# Patient Record
Sex: Female | Born: 1950 | Race: White | Hispanic: No | Marital: Single
Health system: Midwestern US, Community
[De-identification: ages and names within clinical notes are randomized; demographics above are authoritative.]

## PROBLEM LIST (undated history)

## (undated) DIAGNOSIS — H269 Unspecified cataract: Secondary | ICD-10-CM

## (undated) DIAGNOSIS — I4891 Unspecified atrial fibrillation: Secondary | ICD-10-CM

## (undated) DIAGNOSIS — K219 Gastro-esophageal reflux disease without esophagitis: Secondary | ICD-10-CM

## (undated) DIAGNOSIS — I10 Essential (primary) hypertension: Secondary | ICD-10-CM

## (undated) DIAGNOSIS — G43909 Migraine, unspecified, not intractable, without status migrainosus: Secondary | ICD-10-CM

## (undated) DIAGNOSIS — L409 Psoriasis, unspecified: Secondary | ICD-10-CM

## (undated) DIAGNOSIS — T7840XA Allergy, unspecified, initial encounter: Secondary | ICD-10-CM

## (undated) DIAGNOSIS — E7800 Pure hypercholesterolemia, unspecified: Secondary | ICD-10-CM

## (undated) DIAGNOSIS — F419 Anxiety disorder, unspecified: Secondary | ICD-10-CM

## (undated) HISTORY — DX: Allergy, unspecified, initial encounter: T78.40XA

## (undated) HISTORY — PX: TUBAL LIGATION: SHX77

## (undated) HISTORY — DX: Pure hypercholesterolemia, unspecified: E78.00

## (undated) HISTORY — PX: DILATION AND CURETTAGE OF UTERUS: SHX78

## (undated) HISTORY — DX: Essential (primary) hypertension: I10

## (undated) HISTORY — DX: Anxiety disorder, unspecified: F41.9

## (undated) HISTORY — DX: Migraine, unspecified, not intractable, without status migrainosus: G43.909

## (undated) HISTORY — DX: Psoriasis, unspecified: L40.9

## (undated) HISTORY — DX: Unspecified cataract: H26.9

## (undated) HISTORY — DX: Gastro-esophageal reflux disease without esophagitis: K21.9

---

## 2004-02-09 LAB — HM COLONOSCOPY

## 2009-06-08 ENCOUNTER — Ambulatory Visit: Payer: Self-pay | Admitting: Internal Medicine

## 2010-08-05 ENCOUNTER — Ambulatory Visit: Payer: Self-pay | Admitting: Internal Medicine

## 2010-08-13 ENCOUNTER — Ambulatory Visit: Payer: Self-pay | Admitting: Internal Medicine

## 2010-09-01 ENCOUNTER — Ambulatory Visit: Payer: Self-pay | Admitting: Internal Medicine

## 2012-08-27 ENCOUNTER — Other Ambulatory Visit: Payer: Self-pay | Admitting: Internal Medicine

## 2012-08-27 NOTE — Telephone Encounter (Signed)
Have you seen a fax from [facility].  Need to know what meds and the dose she needs filled.

## 2012-08-27 NOTE — Telephone Encounter (Signed)
Pt came in today checking on her rx [facility]  [name] that over 08/27/12 Pt is completely out of her meds Pt didn't know what the name of the meds

## 2012-08-28 ENCOUNTER — Telehealth: Payer: Self-pay | Admitting: *Deleted

## 2012-08-30 ENCOUNTER — Telehealth: Payer: Self-pay | Admitting: *Deleted

## 2012-09-03 NOTE — Telephone Encounter (Signed)
Per note that Dr. Lorin Picket gave me the patient needed hctz 12.5 mg and provastatin 20 mg which she took care of.

## 2012-09-05 NOTE — Telephone Encounter (Signed)
Encounter opened in error

## 2012-09-06 ENCOUNTER — Telehealth: Payer: Self-pay | Admitting: *Deleted

## 2012-09-06 NOTE — Telephone Encounter (Signed)
Received refill request.  New patient appointment is December 23.  Medications are for Pravastatin 20 mg and Hydrochlorot 12.5. Would this be okay?

## 2012-09-06 NOTE — Telephone Encounter (Signed)
These rx were called in already.  I confirmed with pharmacy.

## 2012-10-15 ENCOUNTER — Encounter: Payer: Self-pay | Admitting: Internal Medicine

## 2012-10-15 ENCOUNTER — Other Ambulatory Visit (HOSPITAL_COMMUNITY)
Admission: RE | Admit: 2012-10-15 | Discharge: 2012-10-15 | Disposition: A | Discharge status: Home / Self Care | Payer: Self-pay | Source: Ambulatory Visit | Attending: Internal Medicine | Admitting: Internal Medicine

## 2012-10-15 ENCOUNTER — Ambulatory Visit (INDEPENDENT_AMBULATORY_CARE_PROVIDER_SITE_OTHER): Payer: 59 | Admitting: Internal Medicine

## 2012-10-15 VITALS — BP 120/83 | HR 72 | Temp 98.4°F | Ht 68.0 in | Wt 150.0 lb

## 2012-10-15 DIAGNOSIS — Z139 Encounter for screening, unspecified: Secondary | ICD-10-CM

## 2012-10-15 DIAGNOSIS — G43909 Migraine, unspecified, not intractable, without status migrainosus: Secondary | ICD-10-CM | POA: Insufficient documentation

## 2012-10-15 DIAGNOSIS — Z01419 Encounter for gynecological examination (general) (routine) without abnormal findings: Secondary | ICD-10-CM | POA: Insufficient documentation

## 2012-10-15 DIAGNOSIS — E7800 Pure hypercholesterolemia, unspecified: Secondary | ICD-10-CM

## 2012-10-15 DIAGNOSIS — R5381 Other malaise: Secondary | ICD-10-CM

## 2012-10-15 DIAGNOSIS — K219 Gastro-esophageal reflux disease without esophagitis: Secondary | ICD-10-CM

## 2012-10-15 DIAGNOSIS — R5383 Other fatigue: Secondary | ICD-10-CM

## 2012-10-15 DIAGNOSIS — I10 Essential (primary) hypertension: Secondary | ICD-10-CM | POA: Insufficient documentation

## 2012-10-15 DIAGNOSIS — Z1151 Encounter for screening for human papillomavirus (HPV): Secondary | ICD-10-CM | POA: Insufficient documentation

## 2012-10-15 LAB — BASIC METABOLIC PANEL
BUN: 15 mg/dL (ref 6–23)
CO2: 29 mEq/L (ref 19–32)
Calcium: 8.9 mg/dL (ref 8.4–10.5)
Chloride: 102 mEq/L (ref 96–112)
Creatinine, Ser: 0.7 mg/dL (ref 0.4–1.2)
GFR: 92.2 mL/min (ref >60.00)
Glucose, Bld: 90 mg/dL (ref 70–99)
Potassium: 4 mEq/L (ref 3.5–5.1)
Sodium: 139 mEq/L (ref 135–145)

## 2012-10-15 LAB — CBC WITH DIFFERENTIAL/PLATELET
Basophils Absolute: 0.1 10*3/uL (ref 0.0–0.1)
Basophils Relative: 1.2 % (ref 0.0–3.0)
Eosinophils Absolute: 0.1 10*3/uL (ref 0.0–0.7)
Eosinophils Relative: 2.2 % (ref 0.0–5.0)
HCT: 39.8 % (ref 36.0–46.0)
Hemoglobin: 13.2 g/dL (ref 12.0–15.0)
Lymphocytes Relative: 15.1 % (ref 12.0–46.0)
Lymphs Abs: 0.7 10*3/uL (ref 0.7–4.0)
MCHC: 33.3 g/dL (ref 30.0–36.0)
MCV: 91.8 fl (ref 78.0–100.0)
Monocytes Absolute: 0.4 10*3/uL (ref 0.1–1.0)
Monocytes Relative: 8.4 % (ref 3.0–12.0)
Neutro Abs: 3.6 10*3/uL (ref 1.4–7.7)
Neutrophils Relative %: 73.1 % (ref 43.0–77.0)
Platelets: 251 10*3/uL (ref 150.0–400.0)
RBC: 4.33 Mil/uL (ref 3.87–5.11)
RDW: 13.4 % (ref 11.5–14.6)
WBC: 4.9 10*3/uL (ref 4.5–10.5)

## 2012-10-15 LAB — HEPATIC FUNCTION PANEL
ALT: 15 U/L (ref 0–35)
AST: 21 U/L (ref 0–37)
Albumin: 3.9 g/dL (ref 3.5–5.2)
Alkaline Phosphatase: 55 U/L (ref 39–117)
Bilirubin, Direct: 0.1 mg/dL (ref 0.0–0.3)
Total Bilirubin: 0.5 mg/dL (ref 0.3–1.2)
Total Protein: 6.5 g/dL (ref 6.0–8.3)

## 2012-10-15 LAB — LIPID PANEL
Cholesterol: 169 mg/dL (ref 0–200)
HDL: 63.8 mg/dL (ref >39.00)
LDL Cholesterol: 101 mg/dL — ABNORMAL HIGH (ref 0–99)
Total CHOL/HDL Ratio: 3
Triglycerides: 23 mg/dL (ref 0.0–149.0)
VLDL: 4.6 mg/dL (ref 0.0–40.0)

## 2012-10-15 LAB — TSH: TSH: 1.06 u[IU]/mL (ref 0.35–5.50)

## 2012-10-15 MED ORDER — PRAVASTATIN SODIUM 20 MG PO TABS: 20.0000 mg | ORAL_TABLET | Freq: Every day | ORAL | Status: DC

## 2012-10-15 MED ORDER — HYDROCHLOROTHIAZIDE 12.5 MG PO CAPS: 12.5000 mg | ORAL_CAPSULE | Freq: Every day | ORAL | Status: DC

## 2012-10-19 ENCOUNTER — Encounter: Payer: Self-pay | Admitting: *Deleted

## 2012-10-20 ENCOUNTER — Encounter: Payer: Self-pay | Admitting: Internal Medicine

## 2012-10-20 NOTE — Assessment & Plan Note (Signed)
Blood pressure under good control.  Same medication.  Check metabolic panel.    

## 2012-10-20 NOTE — Assessment & Plan Note (Signed)
Stable.  Follow.   

## 2012-10-20 NOTE — Assessment & Plan Note (Signed)
Symptoms controlled on Prilosec

## 2012-10-20 NOTE — Assessment & Plan Note (Signed)
On pravastatin.  Low cholesterol diet and exercise.  Check lipid panel and liver function.    

## 2012-10-20 NOTE — Progress Notes (Signed)
  Subjective:    Patient ID: Baxter Hire, female    DOB: 1950-11-04, 61 y.o.   MRN: [id number]  HPI 61 year old female with past history of hypertension and hypercholesterolemia who comes in today to follow up on these issues as well as for a complete physical exam.   She states she has been dong well.  No cardiac symptoms with increased activity or exertion.  Breathing stable.  No acid reflux.  Bowels stable.    Past Medical History  Diagnosis Date  . Hypertension   . Hypercholesterolemia   . GERD (gastroesophageal reflux disease)   . Migraine headache   . Psoriasis     Current Outpatient Prescriptions on File Prior to Visit  Medication Sig Dispense Refill  . eletriptan (RELPAX) 20 MG tablet One tablet by mouth at onset of headache. May repeat in 2 hours if headache persists or recurs. may repeat in 2 hours if necessary      . hydrochlorothiazide (MICROZIDE) 12.5 MG capsule Take 1 capsule (12.5 mg total) by mouth daily.  30 capsule  6  . omeprazole (PRILOSEC) 20 MG capsule Take 20 mg by mouth daily.      . pravastatin (PRAVACHOL) 20 MG tablet Take 1 tablet (20 mg total) by mouth daily.  30 tablet  6    Review of Systems Patient denies any headache, lightheadedness or dizziness.  No chest pain, tightness or palpitations.  No increased shortness of breath, cough or congestion.  No nausea or vomiting.  No acid reflux.  No abdominal pain or cramping.  No bowel change, such as diarrhea, constipation, BRBPR or melana.  No urine change.        Objective:   Physical Exam Filed Vitals:   10/15/12 0826  BP: 120/83  Pulse: 72  Temp: 98.4 F (45.11 C)   61  year old female in no acute distress.   HEENT:  Nares- clear.  Oropharynx - without lesions. NECK:  Supple.  Nontender.  No audible bruit.  HEART:  Appears to be regular. LUNGS:  No crackles or wheezing audible.  Respirations even and unlabored.  RADIAL PULSE:  Equal bilaterally.    BREASTS:  No nipple discharge or nipple  retraction present.  Could not appreciate any distinct nodules or axillary adenopathy.  ABDOMEN:  Soft, nontender.  Bowel sounds present and normal.  No audible abdominal bruit.  GU:  Normal external genitalia.  Vaginal vault without lesions.  Cervix identified.  Pap performed. Could not appreciate any adnexal masses or tenderness.   RECTAL:  Heme negative.   EXTREMITIES:  No increased edema present.  DP pulses palpable and equal bilaterally.          Assessment & Plan:  CARDIOVASCULAR.  Stress test 08/17/11 negative for ischemia.  Currently asymptomatic.  Continue risk factor modification.    GI.  Colonoscopy 02/09/04 normal  Recommend follow up 10 years.    GU.  Found to have renal cysts on ultrasound.  MRI 09/01/10 revealed complex renal cyst.  Saw Dr Lonna Cobb.  Had had recommended a follow up MRI in 02/2011.  Have discussed with her the need for follow up MRI.  She has declined and stated she would notify me if she changed her mind.    LEG PAIN.  Stable.  Continue support hose.    HEALTH MAINTENANCE.  Physical today.  Colonoscopy as outlined.  Pap today.  Schedule mammogram.

## 2012-11-08 ENCOUNTER — Ambulatory Visit: Payer: Self-pay | Admitting: Internal Medicine

## 2012-11-14 ENCOUNTER — Encounter: Payer: Self-pay | Admitting: Internal Medicine

## 2012-11-23 ENCOUNTER — Encounter: Payer: Self-pay | Admitting: Internal Medicine

## 2012-11-25 ENCOUNTER — Telehealth: Payer: Self-pay | Admitting: Internal Medicine

## 2012-11-25 NOTE — Telephone Encounter (Signed)
Called in xanax .25mg  q day prn (#30) no refills.

## 2013-04-19 ENCOUNTER — Ambulatory Visit: Payer: 59 | Admitting: Internal Medicine

## 2013-05-29 ENCOUNTER — Ambulatory Visit (INDEPENDENT_AMBULATORY_CARE_PROVIDER_SITE_OTHER): Payer: 59 | Admitting: Adult Health

## 2013-05-29 ENCOUNTER — Encounter: Payer: Self-pay | Admitting: Adult Health

## 2013-05-29 ENCOUNTER — Telehealth: Payer: Self-pay | Admitting: Adult Health

## 2013-05-29 VITALS — BP 102/62 | HR 71 | Temp 98.3°F | Resp 12 | Wt 141.5 lb

## 2013-05-29 DIAGNOSIS — Z1211 Encounter for screening for malignant neoplasm of colon: Secondary | ICD-10-CM

## 2013-05-29 DIAGNOSIS — R197 Diarrhea, unspecified: Secondary | ICD-10-CM

## 2013-05-29 LAB — CBC WITH DIFFERENTIAL/PLATELET
Basophils Absolute: 0 10*3/uL (ref 0.0–0.1)
Basophils Relative: 0.5 % (ref 0.0–3.0)
Eosinophils Absolute: 0.1 10*3/uL (ref 0.0–0.7)
Eosinophils Relative: 2.5 % (ref 0.0–5.0)
HCT: 42.2 % (ref 36.0–46.0)
Hemoglobin: 14 g/dL (ref 12.0–15.0)
Lymphocytes Relative: 21.7 % (ref 12.0–46.0)
Lymphs Abs: 1.3 10*3/uL (ref 0.7–4.0)
MCHC: 33.2 g/dL (ref 30.0–36.0)
MCV: 93.1 fl (ref 78.0–100.0)
Monocytes Absolute: 0.5 10*3/uL (ref 0.1–1.0)
Monocytes Relative: 9.4 % (ref 3.0–12.0)
Neutro Abs: 3.8 10*3/uL (ref 1.4–7.7)
Neutrophils Relative %: 65.9 % (ref 43.0–77.0)
Platelets: 254 10*3/uL (ref 150.0–400.0)
RBC: 4.53 Mil/uL (ref 3.87–5.11)
RDW: 13.2 % (ref 11.5–14.6)
WBC: 5.8 10*3/uL (ref 4.5–10.5)

## 2013-05-29 LAB — BASIC METABOLIC PANEL
BUN: 11 mg/dL (ref 6–23)
CO2: 32 mEq/L (ref 19–32)
Calcium: 9.1 mg/dL (ref 8.4–10.5)
Chloride: 99 mEq/L (ref 96–112)
Creatinine, Ser: 0.7 mg/dL (ref 0.4–1.2)
GFR: 84.87 mL/min (ref >60.00)
Glucose, Bld: 89 mg/dL (ref 70–99)
Potassium: 4.1 mEq/L (ref 3.5–5.1)
Sodium: 140 mEq/L (ref 135–145)

## 2013-05-29 MED ORDER — CIPROFLOXACIN HCL 500 MG PO TABS: 500.0000 mg | ORAL_TABLET | Freq: Two times a day (BID) | ORAL | Status: DC

## 2013-05-29 NOTE — Assessment & Plan Note (Signed)
Abdominal exam with slight tenderness over LLQ.  Check cbc and metabolic panel. Start Cipro 500 mg bid x 12 days. Refer to GI for Colonoscopy. Last colonoscopy 10 years ago.

## 2013-05-29 NOTE — Telephone Encounter (Signed)
Labs done today were normal (cbc & bmet)

## 2013-05-29 NOTE — Progress Notes (Signed)
  Subjective:    Patient ID: Allison [name], female    DOB: [birth year], 62 y.o.   MRN: [id number]  HPI  Patient is a pleasant 61 y/o female who presents to clinic with c/o bloody diarrhea since Sunday. Initially, diarrhea began on Sunday at midnight. She had approx 6 stools without any blood. Then began bloody diarrhea on Monday and had ~ 4. No diarrhea Tuesday. Today she got up around 4 am and had one episode. There was blood when she wiped. She took imodium. She has also taken some Maalox yesterday. She denies any episodes such as this. She ate at [employer] on Sunday and wondered if it may have been something she ate. She reported being chilled and fatigue. Patient denies any hx of diverticulosis or hemorrhoids. Last colonoscopy ~ 10 years ago.  Review of Systems  Constitutional: Positive for chills and fatigue.       Uncertain if she had a fever. She did not check it.  Respiratory: Negative.   Cardiovascular: Negative.   Gastrointestinal: Positive for nausea, vomiting, abdominal pain, diarrhea, blood in stool and anal bleeding.       Vomited x 1 on Sunday midnight  Genitourinary: Negative.   Neurological: Negative.   Psychiatric/Behavioral: Negative.       BP 102/62  Pulse 71  Temp(Src) 98.3 F (36.8 C) (Oral)  Resp 12  Wt 141 lb 8 oz (64.184 kg)  BMI 21.52 kg/m2  SpO2 96%    Objective:   Physical Exam        Assessment & Plan:

## 2013-05-29 NOTE — Patient Instructions (Addendum)
  Please start Cipro 500 mg every 12 hours for 5 days.  Continue to drink fluids.  I am referring to you to GI for colonoscopy.  Please call immediately if her symptoms worsen.

## 2013-05-30 NOTE — Telephone Encounter (Signed)
Called and advised patient of results

## 2013-05-30 NOTE — Telephone Encounter (Signed)
Left message for pt to return my call.

## 2013-06-20 ENCOUNTER — Other Ambulatory Visit: Payer: Self-pay | Admitting: *Deleted

## 2013-06-20 MED ORDER — ALPRAZOLAM 0.25 MG PO TABS: 0.2500 mg | ORAL_TABLET | Freq: Every day | ORAL | Status: DC | PRN

## 2013-06-20 NOTE — Telephone Encounter (Signed)
Okay to refill? Last seen for an Acute visit on 8/6 by Raquel. Last seen by you on 10/15/12

## 2013-06-20 NOTE — Telephone Encounter (Signed)
I am ok to refill x 1, but she needs to schedule a follow up appt with me

## 2013-08-02 ENCOUNTER — Other Ambulatory Visit: Payer: Self-pay | Admitting: *Deleted

## 2013-08-02 MED ORDER — HYDROCHLOROTHIAZIDE 12.5 MG PO CAPS: 12.5000 mg | ORAL_CAPSULE | Freq: Every day | ORAL | Status: DC

## 2013-08-02 MED ORDER — PRAVASTATIN SODIUM 20 MG PO TABS: 20.0000 mg | ORAL_TABLET | Freq: Every day | ORAL | Status: DC

## 2013-08-26 ENCOUNTER — Telehealth: Payer: Self-pay | Admitting: Adult Health

## 2013-08-26 NOTE — Telephone Encounter (Signed)
Patient was referred for a screening colonoscopy. Note from GI stating that patient cancelled appt and declined to reschedule.

## 2013-10-02 ENCOUNTER — Other Ambulatory Visit: Payer: Self-pay | Admitting: *Deleted

## 2013-10-02 ENCOUNTER — Other Ambulatory Visit: Payer: Self-pay | Admitting: Internal Medicine

## 2013-10-07 ENCOUNTER — Telehealth: Payer: Self-pay | Admitting: Internal Medicine

## 2013-10-07 ENCOUNTER — Other Ambulatory Visit: Payer: Self-pay | Admitting: *Deleted

## 2013-10-07 MED ORDER — HYDROCHLOROTHIAZIDE 12.5 MG PO CAPS: 12.5000 mg | ORAL_CAPSULE | Freq: Every day | ORAL | Status: DC

## 2013-10-07 NOTE — Telephone Encounter (Signed)
Rx sent to pharmacy for a one month refill

## 2013-10-07 NOTE — Telephone Encounter (Signed)
The patient is driving long distance and she is needing a prescription called into the pharmacy until she can be seen in January .   hydrochlorothiazide (MICROZIDE) 12.5 MG capsule

## 2013-11-12 ENCOUNTER — Encounter (INDEPENDENT_AMBULATORY_CARE_PROVIDER_SITE_OTHER): Payer: Self-pay

## 2013-11-12 ENCOUNTER — Ambulatory Visit (INDEPENDENT_AMBULATORY_CARE_PROVIDER_SITE_OTHER): Payer: Self-pay | Admitting: Internal Medicine

## 2013-11-12 ENCOUNTER — Encounter: Payer: Self-pay | Admitting: Internal Medicine

## 2013-11-12 ENCOUNTER — Other Ambulatory Visit: Payer: Self-pay | Admitting: Internal Medicine

## 2013-11-12 VITALS — BP 120/80 | HR 75 | Temp 98.3°F | Ht 68.0 in | Wt 139.0 lb

## 2013-11-12 DIAGNOSIS — K219 Gastro-esophageal reflux disease without esophagitis: Secondary | ICD-10-CM

## 2013-11-12 DIAGNOSIS — E7800 Pure hypercholesterolemia, unspecified: Secondary | ICD-10-CM

## 2013-11-12 DIAGNOSIS — H579 Unspecified disorder of eye and adnexa: Secondary | ICD-10-CM

## 2013-11-12 DIAGNOSIS — I10 Essential (primary) hypertension: Secondary | ICD-10-CM

## 2013-11-12 DIAGNOSIS — G43909 Migraine, unspecified, not intractable, without status migrainosus: Secondary | ICD-10-CM

## 2013-11-12 DIAGNOSIS — H5789 Other specified disorders of eye and adnexa: Secondary | ICD-10-CM

## 2013-11-12 MED ORDER — GENTAMICIN SULFATE 0.3 % OP SOLN: 2.0000 [drp] | Freq: Four times a day (QID) | OPHTHALMIC | Status: DC

## 2013-11-12 MED ORDER — ALPRAZOLAM 0.25 MG PO TABS: 0.2500 mg | ORAL_TABLET | Freq: Every day | ORAL | Status: DC | PRN

## 2013-11-12 NOTE — Progress Notes (Signed)
Order placed for labs.

## 2013-11-14 ENCOUNTER — Encounter: Payer: Self-pay | Admitting: Internal Medicine

## 2013-11-14 DIAGNOSIS — H5789 Other specified disorders of eye and adnexa: Secondary | ICD-10-CM | POA: Insufficient documentation

## 2013-11-14 NOTE — Assessment & Plan Note (Signed)
Symptoms controlled on Prilosec

## 2013-11-14 NOTE — Assessment & Plan Note (Signed)
Stable.  Follow.   

## 2013-11-14 NOTE — Progress Notes (Signed)
Subjective:    Patient ID: Allison [name], female    DOB: 1951/10/21, 63 y.o.   MRN: [id number]  HPI 63 year old female with past history of hypertension and hypercholesterolemia who comes in today for a scheduled follow up.   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  No cardiac symptoms with increased activity or exertion.  Breathing stable.  No acid reflux.  Bowels stable.  Was having some constipation.  Better now if watches what she eats.  Increased stress with her financial situation.  Feels she is coping well.  Some eye irritation.  Question of sty.     Past Medical History  Diagnosis Date  . Hypertension   . Hypercholesterolemia   . GERD (gastroesophageal reflux disease)   . Migraine headache   . Psoriasis     Current Outpatient Prescriptions on File Prior to Visit  Medication Sig Dispense Refill  . eletriptan (RELPAX) 20 MG tablet One tablet by mouth at onset of headache. May repeat in 2 hours if headache persists or recurs. may repeat in 2 hours if necessary      . hydrochlorothiazide (MICROZIDE) 12.5 MG capsule Take 1 capsule (12.5 mg total) by mouth daily. MUST KEEP APPT IN January FOR FURTHER REFILLS  30 capsule  0  . omeprazole (PRILOSEC) 20 MG capsule Take 20 mg by mouth daily.       No current facility-administered medications on file prior to visit.    Review of Systems Patient denies any headache, lightheadedness or dizziness.  No chest pain, tightness or palpitations.  No increased shortness of breath, cough or congestion.  No nausea or vomiting.  No acid reflux.  No abdominal pain or cramping.  No bowel change, such as diarrhea, constipation, BRBPR or melana.  No urine change.  Increased stress.  Feels she is handling things relatively well.  Does not feels she needs anything more at this point.        Objective:   Physical Exam  Filed Vitals:   11/12/13 1016  BP: 120/80  Pulse: 75  Temp: 98.3 F (36.8 C)   Blood pressure recheck:  93/17  63  year old female  in no acute distress.   HEENT:  Nares- clear.  Oropharynx - without lesions. NECK:  Supple.  Nontender.  No audible bruit.  HEART:  Appears to be regular. LUNGS:  No crackles or wheezing audible.  Respirations even and unlabored.  RADIAL PULSE:  Equal bilaterally.  ABDOMEN:  Soft, nontender.  Bowel sounds present and normal.  No audible abdominal bruit.    EXTREMITIES:  No increased edema present.  DP pulses palpable and equal bilaterally.          Assessment & Plan:  CARDIOVASCULAR.  Stress test 08/17/11 negative for ischemia.  Currently asymptomatic.  Continue risk factor modification.    GI.  Colonoscopy 02/09/04 normal  Recommend follow up 10 years.  Discussed with her today.  She declines colonoscopy at this time.  Follow.  Will notify me when agreeable.    GU.  Found to have renal cysts on ultrasound.  MRI 09/01/10 revealed complex renal cyst.  Saw Dr Bernardo Heater.  Had had recommended a follow up MRI in 02/2011.  Have discussed with her the need for follow up MRI.  She has declined and continues to decline and stated she would notify me if she changed her mind.    HEALTH MAINTENANCE.  Physical 10/15/13.  Planning to get her physical and mammogram through the [facility].  Last mammogram 11/08/12 Birads II.  Colonoscopy as outlined.  Pap 10/15/13 negative with negative HPV.

## 2013-11-14 NOTE — Assessment & Plan Note (Signed)
On pravastatin.  Low cholesterol diet and exercise.  Check lipid panel and liver function.    

## 2013-11-14 NOTE — Assessment & Plan Note (Signed)
Irritation left lower eye lid.  Question of sty.  Treat with gentomycin eye drops.  Follow.

## 2013-11-14 NOTE — Assessment & Plan Note (Signed)
Blood pressure under good control.  Same medication.  Check metabolic panel.    

## 2013-11-21 ENCOUNTER — Other Ambulatory Visit (INDEPENDENT_AMBULATORY_CARE_PROVIDER_SITE_OTHER): Payer: Self-pay

## 2013-11-21 DIAGNOSIS — K219 Gastro-esophageal reflux disease without esophagitis: Secondary | ICD-10-CM

## 2013-11-21 DIAGNOSIS — E7800 Pure hypercholesterolemia, unspecified: Secondary | ICD-10-CM

## 2013-11-21 DIAGNOSIS — I10 Essential (primary) hypertension: Secondary | ICD-10-CM

## 2013-11-21 LAB — CBC WITH DIFFERENTIAL/PLATELET
Basophils Absolute: 0 10*3/uL (ref 0.0–0.1)
Basophils Relative: 0.6 % (ref 0.0–3.0)
Eosinophils Absolute: 0.1 10*3/uL (ref 0.0–0.7)
Eosinophils Relative: 1.2 % (ref 0.0–5.0)
HCT: 43.2 % (ref 36.0–46.0)
Hemoglobin: 14.1 g/dL (ref 12.0–15.0)
Lymphocytes Relative: 10.7 % — ABNORMAL LOW (ref 12.0–46.0)
Lymphs Abs: 0.9 10*3/uL (ref 0.7–4.0)
MCHC: 32.6 g/dL (ref 30.0–36.0)
MCV: 94.2 fl (ref 78.0–100.0)
Monocytes Absolute: 0.4 10*3/uL (ref 0.1–1.0)
Monocytes Relative: 5.5 % (ref 3.0–12.0)
Neutro Abs: 6.7 10*3/uL (ref 1.4–7.7)
Neutrophils Relative %: 82 % — ABNORMAL HIGH (ref 43.0–77.0)
Platelets: 263 10*3/uL (ref 150.0–400.0)
RBC: 4.59 Mil/uL (ref 3.87–5.11)
RDW: 13.8 % (ref 11.5–14.6)
WBC: 8.1 10*3/uL (ref 4.5–10.5)

## 2013-11-21 LAB — BASIC METABOLIC PANEL
BUN: 15 mg/dL (ref 6–23)
CO2: 32 mEq/L (ref 19–32)
Calcium: 9.5 mg/dL (ref 8.4–10.5)
Chloride: 105 mEq/L (ref 96–112)
Creatinine, Ser: 0.7 mg/dL (ref 0.4–1.2)
GFR: 95.03 mL/min (ref >60.00)
Glucose, Bld: 88 mg/dL (ref 70–99)
Potassium: 4.9 mEq/L (ref 3.5–5.1)
Sodium: 142 mEq/L (ref 135–145)

## 2013-11-21 LAB — HEPATIC FUNCTION PANEL
ALT: 16 U/L (ref 0–35)
AST: 18 U/L (ref 0–37)
Albumin: 4.1 g/dL (ref 3.5–5.2)
Alkaline Phosphatase: 57 U/L (ref 39–117)
Bilirubin, Direct: 0.1 mg/dL (ref 0.0–0.3)
Total Bilirubin: 0.9 mg/dL (ref 0.3–1.2)
Total Protein: 6.6 g/dL (ref 6.0–8.3)

## 2013-11-21 LAB — LIPID PANEL
Cholesterol: 189 mg/dL (ref 0–200)
HDL: 64 mg/dL (ref >39.00)
LDL Cholesterol: 118 mg/dL — ABNORMAL HIGH (ref 0–99)
Total CHOL/HDL Ratio: 3
Triglycerides: 36 mg/dL (ref 0.0–149.0)
VLDL: 7.2 mg/dL (ref 0.0–40.0)

## 2013-11-21 LAB — TSH: TSH: 1.31 u[IU]/mL (ref 0.35–5.50)

## 2013-11-22 ENCOUNTER — Encounter: Payer: Self-pay | Admitting: *Deleted

## 2013-12-09 ENCOUNTER — Other Ambulatory Visit: Payer: Self-pay | Admitting: *Deleted

## 2013-12-09 ENCOUNTER — Telehealth: Payer: Self-pay | Admitting: *Deleted

## 2013-12-09 MED ORDER — PRAVASTATIN SODIUM 10 MG PO TABS: 10.0000 mg | ORAL_TABLET | Freq: Every day | ORAL | Status: DC

## 2013-12-31 NOTE — Telephone Encounter (Signed)
Opened in error

## 2014-01-04 ENCOUNTER — Ambulatory Visit: Payer: Self-pay | Admitting: Family Medicine

## 2014-01-04 LAB — DOT URINE DIP
Blood: NEGATIVE
Glucose,UR: NEGATIVE mg/dL (ref 0–75)
Protein: NEGATIVE
Specific Gravity: >=1.03 (ref 1.003–1.030)

## 2014-03-18 ENCOUNTER — Other Ambulatory Visit: Payer: Self-pay | Admitting: Internal Medicine

## 2014-04-12 ENCOUNTER — Other Ambulatory Visit: Payer: Self-pay | Admitting: Internal Medicine

## 2014-04-14 NOTE — Telephone Encounter (Signed)
This is an antibiotic eye drop.  I am not aware that she is using this on a regular basis.  This is usually used for an acute infection.  Why is she needing a refill?  Is she having an acute infection?  If so, then she will need to be seen.

## 2014-04-14 NOTE — Telephone Encounter (Signed)
Last refill and OV 1.20.15.  Please advise refill

## 2014-05-14 ENCOUNTER — Ambulatory Visit: Payer: Self-pay | Admitting: Internal Medicine

## 2014-05-23 ENCOUNTER — Other Ambulatory Visit: Payer: Self-pay | Admitting: Internal Medicine

## 2014-05-23 NOTE — Telephone Encounter (Signed)
Left Rx refill on pharmacy VM

## 2014-05-23 NOTE — Telephone Encounter (Signed)
Ok'd alprazolam #30 with no refills.  May need to be called in since I am not in office.

## 2014-05-23 NOTE — Telephone Encounter (Signed)
Last refill and OV 1.20.15, next OV 9.15.15.  Please advise refill.

## 2014-07-08 ENCOUNTER — Ambulatory Visit: Payer: Self-pay | Admitting: Internal Medicine

## 2014-07-08 ENCOUNTER — Ambulatory Visit (INDEPENDENT_AMBULATORY_CARE_PROVIDER_SITE_OTHER): Payer: BC Managed Care – PPO | Admitting: Internal Medicine

## 2014-07-08 ENCOUNTER — Encounter: Payer: Self-pay | Admitting: Internal Medicine

## 2014-07-08 VITALS — BP 120/70 | HR 61 | Temp 98.1°F | Ht 67.75 in | Wt 135.0 lb

## 2014-07-08 DIAGNOSIS — K219 Gastro-esophageal reflux disease without esophagitis: Secondary | ICD-10-CM

## 2014-07-08 DIAGNOSIS — G43809 Other migraine, not intractable, without status migrainosus: Secondary | ICD-10-CM

## 2014-07-08 DIAGNOSIS — I10 Essential (primary) hypertension: Secondary | ICD-10-CM

## 2014-07-08 DIAGNOSIS — Z1211 Encounter for screening for malignant neoplasm of colon: Secondary | ICD-10-CM

## 2014-07-08 DIAGNOSIS — K5900 Constipation, unspecified: Secondary | ICD-10-CM

## 2014-07-08 DIAGNOSIS — Z23 Encounter for immunization: Secondary | ICD-10-CM

## 2014-07-08 DIAGNOSIS — E7800 Pure hypercholesterolemia, unspecified: Secondary | ICD-10-CM

## 2014-07-08 LAB — HEPATIC FUNCTION PANEL
ALT: 15 U/L (ref 0–35)
AST: 20 U/L (ref 0–37)
Albumin: 4.1 g/dL (ref 3.5–5.2)
Alkaline Phosphatase: 62 U/L (ref 39–117)
Bilirubin, Direct: 0.1 mg/dL (ref 0.0–0.3)
Total Bilirubin: 0.7 mg/dL (ref 0.2–1.2)
Total Protein: 7 g/dL (ref 6.0–8.3)

## 2014-07-08 LAB — LIPID PANEL
Cholesterol: 202 mg/dL — ABNORMAL HIGH (ref 0–200)
HDL: 63.5 mg/dL (ref >39.00)
LDL Cholesterol: 128 mg/dL — ABNORMAL HIGH (ref 0–99)
NonHDL: 138.5
Total CHOL/HDL Ratio: 3
Triglycerides: 53 mg/dL (ref 0.0–149.0)
VLDL: 10.6 mg/dL (ref 0.0–40.0)

## 2014-07-08 LAB — BASIC METABOLIC PANEL
BUN: 13 mg/dL (ref 6–23)
CO2: 31 mEq/L (ref 19–32)
Calcium: 9.5 mg/dL (ref 8.4–10.5)
Chloride: 98 mEq/L (ref 96–112)
Creatinine, Ser: 0.7 mg/dL (ref 0.4–1.2)
GFR: 87.28 mL/min (ref >60.00)
Glucose, Bld: 80 mg/dL (ref 70–99)
Potassium: 3.8 mEq/L (ref 3.5–5.1)
Sodium: 139 mEq/L (ref 135–145)

## 2014-07-08 LAB — HM MAMMOGRAPHY: HM Mammogram: NEGATIVE

## 2014-07-08 NOTE — Progress Notes (Signed)
Pre visit review using our clinic review tool, if applicable. No additional management support is needed unless otherwise documented below in the visit note. 

## 2014-07-09 ENCOUNTER — Encounter: Payer: Self-pay | Admitting: *Deleted

## 2014-07-09 ENCOUNTER — Telehealth: Payer: Self-pay | Admitting: *Deleted

## 2014-07-09 MED ORDER — CIPROFLOXACIN HCL 500 MG PO TABS: 500.0000 mg | ORAL_TABLET | Freq: Two times a day (BID) | ORAL | Status: DC

## 2014-07-09 NOTE — Telephone Encounter (Signed)
rx printed and placed in your box.

## 2014-07-09 NOTE — Telephone Encounter (Signed)
Rx faxed to [facility].

## 2014-07-09 NOTE — Telephone Encounter (Signed)
Pt states when she was in yesterday, Dr. Nicki Reaper was going to send in a Rx for Cipro for her to have on hand just in case for UTI.

## 2014-07-13 ENCOUNTER — Encounter: Payer: Self-pay | Admitting: Internal Medicine

## 2014-07-13 DIAGNOSIS — K5900 Constipation, unspecified: Secondary | ICD-10-CM | POA: Insufficient documentation

## 2014-07-13 NOTE — Assessment & Plan Note (Signed)
On pravastatin.  Low cholesterol diet and exercise.  Check lipid panel and liver function.    

## 2014-07-13 NOTE — Assessment & Plan Note (Signed)
Blood pressure under good control.  Same medication.  Check metabolic panel.

## 2014-07-13 NOTE — Progress Notes (Signed)
Subjective:    Patient ID: Allison [name], female    DOB: 1951/04/23, 63 y.o.   MRN: [id number]  HPI 63 year old female with past history of hypertension and hypercholesterolemia who comes in today to follow up on these issues as well as for a complete physical exam.  She states she has been dong well.  No cardiac symptoms with increased activity or exertion.  Breathing stable.  No acid reflux.  Some constipation.  Bowel change for her.  Request referral to Dr Tiffany Kocher.  Due f/u colonoscopy this year.  Last - 2005.  No blood in stool.  No nausea or vomiting.  Does have a new job.  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  Is gone a lot.  Pay is good.  She is getting her financial situation straightened out.  Overall feels she is doing relatively well.  Does request to have a prescription on hand if develops uti on the road.  Will start (if issues) and f/u here when returns.       Past Medical History  Diagnosis Date  . Hypertension   . Hypercholesterolemia   . GERD (gastroesophageal reflux disease)   . Migraine headache   . Psoriasis     Current Outpatient Prescriptions on File Prior to Visit  Medication Sig Dispense Refill  . ALPRAZolam (XANAX) 0.25 MG tablet TAKE ONE TABLET BY MOUTH ONCE DAILY AS NEEDED  30 tablet  0  . eletriptan (RELPAX) 20 MG tablet One tablet by mouth at onset of headache. May repeat in 2 hours if headache persists or recurs. may repeat in 2 hours if necessary      . gentamicin (GARAMYCIN) 0.3 % ophthalmic solution Place 2 drops into the left eye every 6 (six) hours.  5 mL  0  . hydrochlorothiazide (MICROZIDE) 12.5 MG capsule Take 1 capsule (12.5 mg total) by mouth daily.  30 capsule  5  . omeprazole (PRILOSEC) 20 MG capsule Take 20 mg by mouth daily.      . pravastatin (PRAVACHOL) 10 MG tablet Take 1 tablet (10 mg total) by mouth daily.  30 tablet  5   No current facility-administered medications on file prior to visit.    Review of Systems Patient denies any headache,  lightheadedness or dizziness.  No chest pain, tightness or palpitations.  No increased shortness of breath, cough or congestion.  No nausea or vomiting.  No acid reflux.  No abdominal pain or cramping.  No BRBPR or melana.  Does report increased constipation.  Due f/u colonoscopy.  No urine change.  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         Objective:   Physical Exam  Filed Vitals:   07/08/14 1029  BP: 120/70  Pulse: 61  Temp: 98.1 F (65.55 C)   63 year old female in no acute distress.   HEENT:  Nares- clear.  Oropharynx - without lesions. NECK:  Supple.  Nontender.  No audible bruit.  HEART:  Appears to be regular. LUNGS:  No crackles or wheezing audible.  Respirations even and unlabored.  RADIAL PULSE:  Equal bilaterally.    BREASTS:  No nipple discharge or nipple retraction present.  Could not appreciate any distinct nodules or axillary adenopathy.  ABDOMEN:  Soft, nontender.  Bowel sounds present and normal.  No audible abdominal bruit.  GU:  Not performed.     EXTREMITIES:  No increased edema present.  DP pulses palpable  and equal bilaterally.          Assessment & Plan:  CARDIOVASCULAR.  Stress test 08/17/11 negative for ischemia.  Currently asymptomatic.  Continue risk factor modification.    GI.  Colonoscopy 02/09/04 normal  Recommend follow up 10 years.  Schedule appt with GI.     GU.  Found to have renal cysts on ultrasound.  MRI 09/01/10 revealed complex renal cyst.  Saw Dr Bernardo Heater.  Had had recommended a follow up MRI in 02/2011.  Have discussed with her the need for follow up MRI.  She has declined and continues to decline and stated she would notify me if she changed her mind.    HEALTH MAINTENANCE.  Physical today.   Last mammogram 11/08/12 Birads II.  Scheduled for f/u mammogram today.  Colonoscopy as outlined.  Refer back to GI.   Pap 10/15/13 negative with negative HPV.     I spent 25 minutes with the  patient and more than 50% of the time was spent in consultation regarding the above.

## 2014-07-13 NOTE — Assessment & Plan Note (Signed)
Stable.  Follow.   

## 2014-07-13 NOTE — Assessment & Plan Note (Signed)
Symptoms controlled on Prilosec

## 2014-07-13 NOTE — Assessment & Plan Note (Signed)
Bowel change as outlined.  Miralax.  Due f/u colonoscopy.  Refer to Dr Tiffany Kocher.

## 2014-09-09 ENCOUNTER — Encounter: Payer: Self-pay | Admitting: *Deleted

## 2014-10-02 ENCOUNTER — Ambulatory Visit: Payer: Self-pay | Admitting: Gastroenterology

## 2014-10-02 LAB — HM COLONOSCOPY: HM Colonoscopy: NORMAL

## 2014-10-10 ENCOUNTER — Other Ambulatory Visit: Payer: Self-pay | Admitting: Internal Medicine

## 2014-10-13 ENCOUNTER — Encounter: Payer: Self-pay | Admitting: Internal Medicine

## 2014-11-11 ENCOUNTER — Ambulatory Visit: Payer: BC Managed Care – PPO | Admitting: Internal Medicine

## 2014-12-07 ENCOUNTER — Other Ambulatory Visit: Payer: Self-pay | Admitting: Internal Medicine

## 2014-12-08 NOTE — Telephone Encounter (Signed)
Refilled xanax #30 with no refills.  rx signed and placed on your desk.

## 2014-12-08 NOTE — Telephone Encounter (Signed)
Last OV 9/15 ok to fill?

## 2014-12-21 ENCOUNTER — Ambulatory Visit: Payer: Self-pay | Admitting: Family Medicine

## 2014-12-31 ENCOUNTER — Telehealth: Payer: Self-pay

## 2014-12-31 NOTE — Telephone Encounter (Signed)
Just let her know we will need to change her appt.  Please hold her name for cancellation.  This will be the fastest way to get her in.  Thanks.  Let me know if problem.

## 2014-12-31 NOTE — Telephone Encounter (Signed)
The patient needs to resch her apt due to a meeting Friday, where do you want her worked in?

## 2015-01-02 ENCOUNTER — Ambulatory Visit: Payer: Self-pay | Admitting: Internal Medicine

## 2015-01-02 ENCOUNTER — Emergency Department: Payer: Self-pay | Admitting: Emergency Medicine

## 2015-01-30 ENCOUNTER — Telehealth: Payer: Self-pay | Admitting: Internal Medicine

## 2015-01-30 NOTE — Telephone Encounter (Signed)
Pt wanted to know if it would be okay to just have a BP done at her pharmacy since Dr. Nicki Reaper wanted here readings or if she needs an appt. Pt is over due for f/u. No appts until June or can the pt be scheduled with Carrie/msn

## 2015-01-31 NOTE — Telephone Encounter (Signed)
Please confirm pt doing ok.  If ok, then schedule her for 02/27/15 (10:00 - block 30 min).  Has doc of pm blocked on Friday at 10:00am.  Please schedule her at this time.  Let me know if she needs something prior.

## 2015-02-02 ENCOUNTER — Telehealth: Payer: Self-pay | Admitting: Internal Medicine

## 2015-02-02 NOTE — Telephone Encounter (Signed)
Left msg to call office to schedule appt 4/11.msn

## 2015-02-02 NOTE — Telephone Encounter (Signed)
Pt stated that she is doing good. Pt has not had a chance to have her BP checked yet.

## 2015-02-16 LAB — SURGICAL PATHOLOGY

## 2015-02-27 ENCOUNTER — Ambulatory Visit: Payer: Self-pay | Admitting: Internal Medicine

## 2015-03-14 ENCOUNTER — Other Ambulatory Visit: Payer: Self-pay | Admitting: Internal Medicine

## 2015-03-14 NOTE — Telephone Encounter (Signed)
Okay to refill? Last seen on 11/11/14 & next appt on: 05/01/15

## 2015-03-15 NOTE — Telephone Encounter (Signed)
ok'd refill for alprazolam #30 with no refills.

## 2015-03-16 NOTE — Telephone Encounter (Signed)
Rx phoned into pharmacy.

## 2015-05-01 ENCOUNTER — Encounter: Payer: Self-pay | Admitting: Internal Medicine

## 2015-05-01 ENCOUNTER — Ambulatory Visit (INDEPENDENT_AMBULATORY_CARE_PROVIDER_SITE_OTHER): Payer: BLUE CROSS/BLUE SHIELD | Admitting: Internal Medicine

## 2015-05-01 VITALS — BP 108/80 | HR 74 | Temp 98.1°F | Ht 67.75 in | Wt 132.0 lb

## 2015-05-01 DIAGNOSIS — E7800 Pure hypercholesterolemia, unspecified: Secondary | ICD-10-CM

## 2015-05-01 DIAGNOSIS — I10 Essential (primary) hypertension: Secondary | ICD-10-CM | POA: Diagnosis not present

## 2015-05-01 DIAGNOSIS — Z1239 Encounter for other screening for malignant neoplasm of breast: Secondary | ICD-10-CM | POA: Diagnosis not present

## 2015-05-01 DIAGNOSIS — R252 Cramp and spasm: Secondary | ICD-10-CM

## 2015-05-01 DIAGNOSIS — G43809 Other migraine, not intractable, without status migrainosus: Secondary | ICD-10-CM

## 2015-05-01 DIAGNOSIS — Z0000 Encounter for general adult medical examination without abnormal findings: Secondary | ICD-10-CM

## 2015-05-01 DIAGNOSIS — K219 Gastro-esophageal reflux disease without esophagitis: Secondary | ICD-10-CM

## 2015-05-01 LAB — MAGNESIUM: Magnesium: 2.1 mg/dL (ref 1.5–2.5)

## 2015-05-01 LAB — CBC WITH DIFFERENTIAL/PLATELET
Basophils Absolute: 0 10*3/uL (ref 0.0–0.1)
Basophils Relative: 0.8 % (ref 0.0–3.0)
Eosinophils Absolute: 0.1 10*3/uL (ref 0.0–0.7)
Eosinophils Relative: 1.7 % (ref 0.0–5.0)
HCT: 40.7 % (ref 36.0–46.0)
Hemoglobin: 13.5 g/dL (ref 12.0–15.0)
Lymphocytes Relative: 22.7 % (ref 12.0–46.0)
Lymphs Abs: 1.2 10*3/uL (ref 0.7–4.0)
MCHC: 33.2 g/dL (ref 30.0–36.0)
MCV: 92.5 fl (ref 78.0–100.0)
Monocytes Absolute: 0.4 10*3/uL (ref 0.1–1.0)
Monocytes Relative: 7.2 % (ref 3.0–12.0)
Neutro Abs: 3.7 10*3/uL (ref 1.4–7.7)
Neutrophils Relative %: 67.6 % (ref 43.0–77.0)
Platelets: 213 10*3/uL (ref 150.0–400.0)
RBC: 4.4 Mil/uL (ref 3.87–5.11)
RDW: 13.5 % (ref 11.5–15.5)
WBC: 5.4 10*3/uL (ref 4.0–10.5)

## 2015-05-01 LAB — BASIC METABOLIC PANEL
BUN: 13 mg/dL (ref 6–23)
CO2: 35 mEq/L — ABNORMAL HIGH (ref 19–32)
Calcium: 9.4 mg/dL (ref 8.4–10.5)
Chloride: 103 mEq/L (ref 96–112)
Creatinine, Ser: 0.68 mg/dL (ref 0.40–1.20)
GFR: 92.98 mL/min (ref >60.00)
Glucose, Bld: 67 mg/dL — ABNORMAL LOW (ref 70–99)
Potassium: 4.6 mEq/L (ref 3.5–5.1)
Sodium: 142 mEq/L (ref 135–145)

## 2015-05-01 LAB — HEPATIC FUNCTION PANEL
ALT: 10 U/L (ref 0–35)
AST: 18 U/L (ref 0–37)
Albumin: 4 g/dL (ref 3.5–5.2)
Alkaline Phosphatase: 59 U/L (ref 39–117)
Bilirubin, Direct: 0.1 mg/dL (ref 0.0–0.3)
Total Bilirubin: 0.4 mg/dL (ref 0.2–1.2)
Total Protein: 6.4 g/dL (ref 6.0–8.3)

## 2015-05-01 LAB — TSH: TSH: 0.66 u[IU]/mL (ref 0.35–4.50)

## 2015-05-01 MED ORDER — HYDROCHLOROTHIAZIDE 12.5 MG PO CAPS: 12.5000 mg | ORAL_CAPSULE | Freq: Every day | ORAL | Status: DC

## 2015-05-01 MED ORDER — PRAVASTATIN SODIUM 10 MG PO TABS: 10.0000 mg | ORAL_TABLET | Freq: Every day | ORAL | Status: DC

## 2015-05-01 NOTE — Progress Notes (Signed)
Pre visit review using our clinic review tool, if applicable. No additional management support is needed unless otherwise documented below in the visit note. 

## 2015-05-01 NOTE — Progress Notes (Signed)
Patient ID: Allison [name], female   DOB: [birth year], 64 y.o.   MRN: [id number]   Subjective:    Patient ID: Allison [name], female    DOB: 1951-05-30, 64 y.o.   MRN: [id number]  HPI  Patient here for a scheduled follow up.  She is driving a truck.  Not eating like she did.  Has lost some weight.  Has leveled off per her report.  She does eat regular meals, just not the same food.  No nausea or vomiting.  Bowels stable.  Stays active.  No cardiac symptoms with increased activity or exertion.  No sob.  No urinary symptoms.  Does report some leg cramps.  Feels may be related to position in the truck.    Past Medical History  Diagnosis Date  . Hypertension   . Hypercholesterolemia   . GERD (gastroesophageal reflux disease)   . Migraine headache   . Psoriasis     Outpatient Encounter Prescriptions as of 05/01/2015  Medication Sig  . ALPRAZolam (XANAX) 0.25 MG tablet TAKE ONE TABLET BY MOUTH ONCE DAILY AS NEEDED  . eletriptan (RELPAX) 20 MG tablet One tablet by mouth at onset of headache. May repeat in 2 hours if headache persists or recurs. may repeat in 2 hours if necessary  . gentamicin (GARAMYCIN) 0.3 % ophthalmic solution Place 2 drops into the left eye every 6 (six) hours.  . hydrochlorothiazide (MICROZIDE) 12.5 MG capsule Take 1 capsule (12.5 mg total) by mouth daily.  Marland Kitchen omeprazole (PRILOSEC) 20 MG capsule Take 20 mg by mouth daily.  . pravastatin (PRAVACHOL) 10 MG tablet Take 1 tablet (10 mg total) by mouth daily.  . [DISCONTINUED] ciprofloxacin (CIPRO) 500 MG tablet Take 1 tablet (500 mg total) by mouth 2 (two) times daily.  . [DISCONTINUED] hydrochlorothiazide (MICROZIDE) 12.5 MG capsule TAKE ONE CAPSULE BY MOUTH ONCE DAILY  . [DISCONTINUED] pravastatin (PRAVACHOL) 10 MG tablet TAKE ONE TABLET BY MOUTH ONCE DAILY   No facility-administered encounter medications on file as of 05/01/2015.    Review of Systems  Constitutional: Negative for appetite change.       Has  lost weight.  Per pt, secondary to diet change.    HENT: Negative for congestion and sinus pressure.   Respiratory: Negative for cough, chest tightness and shortness of breath.   Cardiovascular: Negative for chest pain, palpitations and leg swelling.  Gastrointestinal: Negative for nausea, vomiting, abdominal pain and diarrhea.  Genitourinary: Negative for dysuria and difficulty urinating.  Skin: Negative for color change and rash.  Neurological: Negative for dizziness, light-headedness and headaches.  Psychiatric/Behavioral: Negative for dysphoric mood and agitation.       Objective:     Blood pressure recheck:  120/78  Physical Exam  Constitutional: No distress.  HENT:  Nose: Nose normal.  Mouth/Throat: Oropharynx is clear and moist.  Neck: Neck supple. No thyromegaly present.  Cardiovascular: Normal rate and regular rhythm.   Pulmonary/Chest: Breath sounds normal. No respiratory distress. She has no wheezes.  Abdominal: Soft. Bowel sounds are normal. There is no tenderness.  Musculoskeletal: She exhibits no edema or tenderness.  Lymphadenopathy:    She has no cervical adenopathy.  Skin: No rash noted. No erythema.  Psychiatric: She has a normal mood and affect. Her behavior is normal.    BP 108/80 mmHg  Pulse 74  Temp(Src) 98.1 F (36.7 C) (Oral)  Ht 5' 7.75" (1.721 m)  Wt 132 lb (59.875 kg)  BMI 20.22 kg/m2  SpO2 96% Wt Readings from Last  3 Encounters:  05/01/15 132 lb (59.875 kg)  07/08/14 135 lb (61.236 kg)  11/12/13 139 lb (63.05 kg)     Lab Results  Component Value Date   WBC 5.4 05/01/2015   HGB 13.5 05/01/2015   HCT 40.7 05/01/2015   PLT 213.0 05/01/2015   GLUCOSE 67* 05/01/2015   CHOL 202* 07/08/2014   TRIG 53.0 07/08/2014   HDL 63.50 07/08/2014   LDLCALC 128* 07/08/2014   ALT 10 05/01/2015   AST 18 05/01/2015   NA 142 05/01/2015   K 4.6 05/01/2015   CL 103 05/01/2015   CREATININE 0.68 05/01/2015   BUN 13 05/01/2015   CO2 35* 05/01/2015    TSH 0.66 05/01/2015       Assessment & Plan:   Problem List Items Addressed This Visit    GERD (gastroesophageal reflux disease)    EGD 10/02/14 - normal.  Some mild chronic inflammation - pathology.  On omeprazole.        Health care maintenance    Physical 07/08/14.  Mammogram 07/08/14 - Birads I.  Schedule f/u mammogram.  Colonoscopy 09/2014.  Recommended f/u colonoscopy in five years.        Hypercholesterolemia    On pravastatin.  Check lipid panel and liver function tests.       Relevant Medications   pravastatin (PRAVACHOL) 10 MG tablet   hydrochlorothiazide (MICROZIDE) 12.5 MG capsule   Other Relevant Orders   Hepatic function panel (Completed)   Hypertension    Blood pressure doing well.  Only takes her medication qod.  Follow pressures.  Follow metabolic panel.        Relevant Medications   pravastatin (PRAVACHOL) 10 MG tablet   hydrochlorothiazide (MICROZIDE) 12.5 MG capsule   Other Relevant Orders   TSH (Completed)   CBC with Differential/Platelet (Completed)   Basic metabolic panel (Completed)   Leg cramps    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        Relevant Orders   Magnesium (Completed)   Migraine headache    Not reported as an issue today.        Relevant Medications   pravastatin (PRAVACHOL) 10 MG tablet   hydrochlorothiazide (MICROZIDE) 12.5 MG capsule    Other Visit Diagnoses    Screening breast examination    -  Primary    Relevant Orders    MM DIGITAL SCREENING BILATERAL      I spent 25 minutes with the patient and more than 50% of the time was spent in consultation regarding the above.     Einar Pheasant, MD

## 2015-05-04 ENCOUNTER — Encounter: Payer: Self-pay | Admitting: *Deleted

## 2015-05-04 ENCOUNTER — Encounter: Payer: Self-pay | Admitting: Internal Medicine

## 2015-05-04 DIAGNOSIS — Z0000 Encounter for general adult medical examination without abnormal findings: Secondary | ICD-10-CM | POA: Insufficient documentation

## 2015-05-04 NOTE — Assessment & Plan Note (Signed)
Blood pressure doing well.  Only takes her medication qod.  Follow pressures.  Follow metabolic panel.

## 2015-05-04 NOTE — Assessment & Plan Note (Signed)
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

## 2015-05-04 NOTE — Assessment & Plan Note (Signed)
Not reported as an issue today.  

## 2015-05-04 NOTE — Assessment & Plan Note (Signed)
On pravastatin.  Check lipid panel and liver function tests.   

## 2015-05-04 NOTE — Assessment & Plan Note (Signed)
Physical 07/08/14.  Mammogram 07/08/14 - Birads I.  Schedule f/u mammogram.  Colonoscopy 09/2014.  Recommended f/u colonoscopy in five years.

## 2015-05-04 NOTE — Assessment & Plan Note (Signed)
EGD 10/02/14 - normal.  Some mild chronic inflammation - pathology.  On omeprazole.

## 2015-07-13 ENCOUNTER — Ambulatory Visit (INDEPENDENT_AMBULATORY_CARE_PROVIDER_SITE_OTHER): Payer: BLUE CROSS/BLUE SHIELD | Admitting: Internal Medicine

## 2015-07-13 ENCOUNTER — Ambulatory Visit
Admission: RE | Admit: 2015-07-13 | Discharge: 2015-07-13 | Disposition: A | Discharge status: Home / Self Care | Payer: BLUE CROSS/BLUE SHIELD | Source: Ambulatory Visit | Attending: Internal Medicine | Admitting: Internal Medicine

## 2015-07-13 ENCOUNTER — Other Ambulatory Visit (HOSPITAL_COMMUNITY)
Admission: RE | Admit: 2015-07-13 | Discharge: 2015-07-13 | Disposition: A | Discharge status: Home / Self Care | Payer: BLUE CROSS/BLUE SHIELD | Source: Ambulatory Visit | Attending: Internal Medicine | Admitting: Internal Medicine

## 2015-07-13 ENCOUNTER — Encounter: Payer: Self-pay | Admitting: Internal Medicine

## 2015-07-13 VITALS — BP 118/70 | HR 73 | Temp 98.1°F | Resp 17 | Ht 68.0 in | Wt 138.0 lb

## 2015-07-13 DIAGNOSIS — E7800 Pure hypercholesterolemia, unspecified: Secondary | ICD-10-CM

## 2015-07-13 DIAGNOSIS — Z1151 Encounter for screening for human papillomavirus (HPV): Secondary | ICD-10-CM | POA: Insufficient documentation

## 2015-07-13 DIAGNOSIS — Z0000 Encounter for general adult medical examination without abnormal findings: Secondary | ICD-10-CM

## 2015-07-13 DIAGNOSIS — Z124 Encounter for screening for malignant neoplasm of cervix: Secondary | ICD-10-CM | POA: Diagnosis not present

## 2015-07-13 DIAGNOSIS — I10 Essential (primary) hypertension: Secondary | ICD-10-CM

## 2015-07-13 DIAGNOSIS — Z01419 Encounter for gynecological examination (general) (routine) without abnormal findings: Secondary | ICD-10-CM | POA: Diagnosis present

## 2015-07-13 DIAGNOSIS — K219 Gastro-esophageal reflux disease without esophagitis: Secondary | ICD-10-CM

## 2015-07-13 DIAGNOSIS — G43809 Other migraine, not intractable, without status migrainosus: Secondary | ICD-10-CM

## 2015-07-13 DIAGNOSIS — Z1231 Encounter for screening mammogram for malignant neoplasm of breast: Secondary | ICD-10-CM | POA: Insufficient documentation

## 2015-07-13 DIAGNOSIS — Z1239 Encounter for other screening for malignant neoplasm of breast: Secondary | ICD-10-CM

## 2015-07-13 NOTE — Progress Notes (Signed)
Pre-visit discussion using our clinic review tool. No additional management support is needed unless otherwise documented below in the visit note.  

## 2015-07-13 NOTE — Progress Notes (Signed)
Patient ID: Allison [name], female   DOB: August 06, 1951, 64 y.o.   MRN: [id number]   Subjective:    Patient ID: Allison [name], female    DOB: 30-Oct-1950, 59 y.o.   MRN: [id number]  HPI  Patient here for her physical exam.  Also here to follow up on her blood pressure and her cholesterol.  She is driving a truck.  Doing well with this.  Tries to stay active.  No cardiac symptoms with increased activity or exertion.  No sob.  No abdominal pain or cramping.  Takes flaxseed and stool softener.  This keeps her bowels regular.  Handling stress well.  Overall feeling good.    Past Medical History  Diagnosis Date  . Hypertension   . Hypercholesterolemia   . GERD (gastroesophageal reflux disease)   . Migraine headache   . Psoriasis    Past Surgical History  Procedure Laterality Date  . Dilation and curettage of uterus      history of abnormal bleeding  . Tubal ligation     Family History  Problem Relation Age of Onset  . Asthma Father   . Diabetes Mother   . CVA Mother   . Breast cancer Maternal Grandmother   . Breast cancer Paternal Grandmother   . Colon cancer Maternal Uncle   . Colon cancer      nephew  . Psoriasis Mother   . Diabetes      multiple relatives (both sides)   Social History   Social History  . Marital Status: Divorced    Spouse Name: N/A  . Number of Children: 1  . Years of Education: N/A   Social History Main Topics  . Smoking status: Never Smoker   . Smokeless tobacco: Never Used  . Alcohol Use: No  . Drug Use: No  . Sexual Activity: Not Asked   Other Topics Concern  . None   Social History Narrative     Review of Systems  Constitutional: Negative for appetite change and unexpected weight change.       Has gained some weight since her last visit.    HENT: Negative for congestion and sinus pressure.   Eyes: Negative for pain and visual disturbance.  Respiratory: Negative for cough, chest tightness and shortness of breath.     Cardiovascular: Negative for chest pain, palpitations and leg swelling.  Gastrointestinal: Negative for nausea, vomiting, abdominal pain and diarrhea.       Bowels more regular on her current regimen.   Genitourinary: Negative for dysuria and difficulty urinating.  Musculoskeletal: Negative for back pain and joint swelling.  Skin: Negative for color change and rash.  Neurological: Negative for dizziness, light-headedness and headaches.  Hematological: Negative for adenopathy. Does not bruise/bleed easily.  Psychiatric/Behavioral: Negative for dysphoric mood and agitation.       Objective:     Blood pressure rechecked by me:  118/68  Physical Exam  Constitutional: She is oriented to person, place, and time. She appears well-developed and well-nourished. No distress.  HENT:  Nose: Nose normal.  Mouth/Throat: Oropharynx is clear and moist.  Eyes: Right eye exhibits no discharge. Left eye exhibits no discharge. No scleral icterus.  Neck: Neck supple. No thyromegaly present.  Cardiovascular: Normal rate and regular rhythm.   Pulmonary/Chest: Breath sounds normal. No accessory muscle usage. No tachypnea. No respiratory distress. She has no decreased breath sounds. She has no wheezes. She has no rhonchi. Right breast exhibits no inverted nipple, no mass, no nipple discharge and  no tenderness (no axillary adenopathy). Left breast exhibits no inverted nipple, no mass, no nipple discharge and no tenderness (no axilarry adenopathy).  Abdominal: Soft. Bowel sounds are normal. There is no tenderness.  Genitourinary:  Normal external genitalia.  Vaginal vault without lesions. Atrophy changes present.  Cervix identified.  Pap smear performed.  Could not appreciate any adnexal masses or tenderness.    Musculoskeletal: She exhibits no edema or tenderness.  Lymphadenopathy:    She has no cervical adenopathy.  Neurological: She is alert and oriented to person, place, and time.  Skin: Skin is warm. No  rash noted. No erythema.  Psychiatric: She has a normal mood and affect. Her behavior is normal.    BP 118/70 mmHg  Pulse 73  Temp(Src) 98.1 F (36.7 C) (Oral)  Resp 17  Ht 5\' 8"  (1.727 m)  Wt 138 lb (62.596 kg)  BMI 20.99 kg/m2  SpO2 98% Wt Readings from Last 3 Encounters:  07/13/15 138 lb (62.596 kg)  05/01/15 132 lb (59.875 kg)  07/08/14 135 lb (61.236 kg)     Lab Results  Component Value Date   WBC 5.4 05/01/2015   HGB 13.5 05/01/2015   HCT 40.7 05/01/2015   PLT 213.0 05/01/2015   GLUCOSE 67* 05/01/2015   CHOL 202* 07/08/2014   TRIG 53.0 07/08/2014   HDL 63.50 07/08/2014   LDLCALC 128* 07/08/2014   ALT 10 05/01/2015   AST 18 05/01/2015   NA 142 05/01/2015   K 4.6 05/01/2015   CL 103 05/01/2015   CREATININE 0.68 05/01/2015   BUN 13 05/01/2015   CO2 35* 05/01/2015   TSH 0.66 05/01/2015       Assessment & Plan:   Problem List Items Addressed This Visit    GERD (gastroesophageal reflux disease)    EGD 10/02/14 as outlined in overview.  No upper symptoms.  On omeprazole.        Health care maintenance    Physical today 07/13/15.  Mammogram 07/14/15 - Birads I.  Colonoscopy 09/2014.  Recommended f/u colonoscopy in five years.        Hypercholesterolemia    Low cholesterol diet and exercise.  Follow lipid panel and liver function tests.  On pravastatin.  PAP today.        Relevant Orders   Lipid panel   Hepatic function panel   Hypertension - Primary    Blood pressure under good control.  Continue same medication regimen.  Follow pressures.  Follow metabolic panel.        Relevant Orders   Basic metabolic panel   Migraine headache    No problems reported.  Follow.         Other Visit Diagnoses    Pap smear for cervical cancer screening        Relevant Orders    Cytology - PAP (Completed)        Einar Pheasant, MD

## 2015-07-14 LAB — CYTOLOGY - PAP

## 2015-07-15 ENCOUNTER — Encounter: Payer: Self-pay | Admitting: *Deleted

## 2015-07-19 ENCOUNTER — Encounter: Payer: Self-pay | Admitting: Internal Medicine

## 2015-07-19 NOTE — Assessment & Plan Note (Signed)
Blood pressure under good control.  Continue same medication regimen.  Follow pressures.  Follow metabolic panel.   

## 2015-07-19 NOTE — Assessment & Plan Note (Signed)
EGD 10/02/14 as outlined in overview.  No upper symptoms.  On omeprazole.

## 2015-07-19 NOTE — Assessment & Plan Note (Signed)
No problems reported.  Follow.

## 2015-07-19 NOTE — Assessment & Plan Note (Addendum)
Low cholesterol diet and exercise.  Follow lipid panel and liver function tests.  On pravastatin.  PAP today.

## 2015-07-19 NOTE — Assessment & Plan Note (Signed)
Physical today 07/13/15.  Mammogram 07/14/15 - Birads I.  Colonoscopy 09/2014.  Recommended f/u colonoscopy in five years.

## 2015-07-21 ENCOUNTER — Ambulatory Visit (INDEPENDENT_AMBULATORY_CARE_PROVIDER_SITE_OTHER): Payer: BLUE CROSS/BLUE SHIELD

## 2015-07-21 ENCOUNTER — Other Ambulatory Visit (INDEPENDENT_AMBULATORY_CARE_PROVIDER_SITE_OTHER): Payer: BLUE CROSS/BLUE SHIELD

## 2015-07-21 DIAGNOSIS — Z23 Encounter for immunization: Secondary | ICD-10-CM

## 2015-07-21 DIAGNOSIS — E7800 Pure hypercholesterolemia, unspecified: Secondary | ICD-10-CM

## 2015-07-21 DIAGNOSIS — I10 Essential (primary) hypertension: Secondary | ICD-10-CM | POA: Diagnosis not present

## 2015-07-21 LAB — LIPID PANEL
Cholesterol: 180 mg/dL (ref 0–200)
HDL: 60.7 mg/dL (ref >39.00)
LDL Cholesterol: 107 mg/dL — ABNORMAL HIGH (ref 0–99)
NonHDL: 119.79
Total CHOL/HDL Ratio: 3
Triglycerides: 62 mg/dL (ref 0.0–149.0)
VLDL: 12.4 mg/dL (ref 0.0–40.0)

## 2015-07-21 LAB — HEPATIC FUNCTION PANEL
ALT: 11 U/L (ref 0–35)
AST: 19 U/L (ref 0–37)
Albumin: 4 g/dL (ref 3.5–5.2)
Alkaline Phosphatase: 56 U/L (ref 39–117)
Bilirubin, Direct: 0.1 mg/dL (ref 0.0–0.3)
Total Bilirubin: 0.4 mg/dL (ref 0.2–1.2)
Total Protein: 6.6 g/dL (ref 6.0–8.3)

## 2015-07-21 LAB — BASIC METABOLIC PANEL
BUN: 23 mg/dL (ref 6–23)
CO2: 33 mEq/L — ABNORMAL HIGH (ref 19–32)
Calcium: 9.3 mg/dL (ref 8.4–10.5)
Chloride: 102 mEq/L (ref 96–112)
Creatinine, Ser: 0.88 mg/dL (ref 0.40–1.20)
GFR: 69 mL/min (ref >60.00)
Glucose, Bld: 97 mg/dL (ref 70–99)
Potassium: 4.1 mEq/L (ref 3.5–5.1)
Sodium: 141 mEq/L (ref 135–145)

## 2015-07-21 NOTE — Progress Notes (Signed)
Patient came in for flu shot.  Per Dr. Nicki Reaper, Patient took a zytrec prior to coming due to reaction last year at the site.

## 2015-07-22 ENCOUNTER — Encounter: Payer: Self-pay | Admitting: *Deleted

## 2015-07-27 ENCOUNTER — Telehealth: Payer: Self-pay

## 2015-07-27 NOTE — Telephone Encounter (Signed)
Left results on cell number. Labs were all normal. (see lab results) & letter was mailed on 07/22/15.

## 2015-07-27 NOTE — Telephone Encounter (Signed)
Patient called wanting results from most recent labwork. Thanks!

## 2015-12-11 ENCOUNTER — Other Ambulatory Visit: Payer: Self-pay | Admitting: Internal Medicine

## 2016-01-20 ENCOUNTER — Ambulatory Visit: Payer: BLUE CROSS/BLUE SHIELD | Admitting: Internal Medicine

## 2016-01-22 ENCOUNTER — Encounter: Payer: Self-pay | Admitting: Internal Medicine

## 2016-01-22 ENCOUNTER — Ambulatory Visit (INDEPENDENT_AMBULATORY_CARE_PROVIDER_SITE_OTHER): Payer: BLUE CROSS/BLUE SHIELD | Admitting: Internal Medicine

## 2016-01-22 VITALS — BP 110/80 | HR 85 | Temp 97.9°F | Resp 18 | Ht 68.0 in | Wt 157.5 lb

## 2016-01-22 DIAGNOSIS — Z1239 Encounter for other screening for malignant neoplasm of breast: Secondary | ICD-10-CM | POA: Diagnosis not present

## 2016-01-22 DIAGNOSIS — K219 Gastro-esophageal reflux disease without esophagitis: Secondary | ICD-10-CM | POA: Diagnosis not present

## 2016-01-22 DIAGNOSIS — M25511 Pain in right shoulder: Secondary | ICD-10-CM

## 2016-01-22 DIAGNOSIS — E7800 Pure hypercholesterolemia, unspecified: Secondary | ICD-10-CM

## 2016-01-22 DIAGNOSIS — I10 Essential (primary) hypertension: Secondary | ICD-10-CM

## 2016-01-22 DIAGNOSIS — G43809 Other migraine, not intractable, without status migrainosus: Secondary | ICD-10-CM

## 2016-01-22 MED ORDER — ELETRIPTAN HYDROBROMIDE 20 MG PO TABS: 20.0000 mg | ORAL_TABLET | ORAL | Status: DC | PRN

## 2016-01-22 NOTE — Progress Notes (Signed)
Pre-visit discussion using our clinic review tool. No additional management support is needed unless otherwise documented below in the visit note.  

## 2016-01-22 NOTE — Progress Notes (Signed)
Patient ID: Allison [name], female   DOB: September 24, 1951, 65 y.o.   MRN: [id number]   Subjective:    Patient ID: Allison [name], female    DOB: [birth year], 66 y.o.   MRN: [id number]  HPI  Patient here for a scheduled follow up. She is feeling better. Eating better.  Weight is up.  No chest pain or tightness.  No sob.  No acid reflux.  No abdominal pain or cramping.  Bowels stable.  Was recently evaluated at [facility].  Had xray wrist.  Ok.  S/p cortisone injection right shoulder.  Taking mobic.  Shoulder much better.  Handling stress well.    Past Medical History  Diagnosis Date  . Hypertension   . Hypercholesterolemia   . GERD (gastroesophageal reflux disease)   . Migraine headache   . Psoriasis    Past Surgical History  Procedure Laterality Date  . Dilation and curettage of uterus      history of abnormal bleeding  . Tubal ligation     Family History  Problem Relation Age of Onset  . Asthma Father   . Diabetes Mother   . CVA Mother   . Breast cancer Maternal Grandmother   . Breast cancer Paternal Grandmother   . Colon cancer Maternal Uncle   . Colon cancer      nephew  . Psoriasis Mother   . Diabetes      multiple relatives (both sides)   Social History   Social History  . Marital Status: Divorced    Spouse Name: N/A  . Number of Children: 1  . Years of Education: N/A   Social History Main Topics  . Smoking status: Never Smoker   . Smokeless tobacco: Never Used  . Alcohol Use: No  . Drug Use: No  . Sexual Activity: Not Asked   Other Topics Concern  . None   Social History Narrative    Outpatient Encounter Prescriptions as of 01/22/2016  Medication Sig  . ALPRAZolam (XANAX) 0.25 MG tablet TAKE ONE TABLET BY MOUTH ONCE DAILY AS NEEDED  . eletriptan (RELPAX) 20 MG tablet Take 1 tablet (20 mg total) by mouth as needed. may repeat in 2 hours if necessary  . gentamicin (GARAMYCIN) 0.3 % ophthalmic solution Place 2 drops into the left eye  every 6 (six) hours.  . hydrochlorothiazide (MICROZIDE) 12.5 MG capsule Take 1 capsule (12.5 mg total) by mouth daily.  . MELOXICAM PO Take by mouth.  Marland Kitchen omeprazole (PRILOSEC) 20 MG capsule Take 20 mg by mouth daily.  . pravastatin (PRAVACHOL) 10 MG tablet Take 1 tablet (10 mg total) by mouth daily.  . [DISCONTINUED] eletriptan (RELPAX) 20 MG tablet One tablet by mouth at onset of headache. May repeat in 2 hours if headache persists or recurs. may repeat in 2 hours if necessary   No facility-administered encounter medications on file as of 01/22/2016.    Review of Systems  Constitutional: Negative for appetite change and unexpected weight change.  HENT: Negative for congestion and sinus pressure.   Respiratory: Negative for cough, chest tightness and shortness of breath.   Cardiovascular: Negative for chest pain, palpitations and leg swelling.  Gastrointestinal: Negative for nausea, vomiting, abdominal pain and diarrhea.  Genitourinary: Negative for dysuria and difficulty urinating.  Musculoskeletal: Negative for back pain and joint swelling.  Skin: Negative for color change and rash.  Neurological: Negative for dizziness, light-headedness and headaches.  Psychiatric/Behavioral: Negative for dysphoric mood and agitation.       Objective:  Physical Exam  Constitutional: She appears well-developed and well-nourished. No distress.  HENT:  Nose: Nose normal.  Mouth/Throat: Oropharynx is clear and moist.  Neck: Neck supple. No thyromegaly present.  Cardiovascular: Normal rate and regular rhythm.   Pulmonary/Chest: Breath sounds normal. No respiratory distress. She has no wheezes.  Abdominal: Soft. Bowel sounds are normal. There is no tenderness.  Musculoskeletal: She exhibits no edema or tenderness.  Lymphadenopathy:    She has no cervical adenopathy.  Skin: No rash noted. No erythema.  Psychiatric: She has a normal mood and affect. Her behavior is normal.    BP 110/80 mmHg   Pulse 85  Temp(Src) 97.9 F (36.6 C) (Oral)  Resp 18  Ht 5\' 8"  (1.727 m)  Wt 157 lb 8 oz (71.442 kg)  BMI 23.95 kg/m2  SpO2 97% Wt Readings from Last 3 Encounters:  01/22/16 157 lb 8 oz (71.442 kg)  07/13/15 138 lb (62.596 kg)  05/01/15 132 lb (59.875 kg)     Lab Results  Component Value Date   WBC 5.4 05/01/2015   HGB 13.5 05/01/2015   HCT 40.7 05/01/2015   PLT 213.0 05/01/2015   GLUCOSE 97 07/21/2015   CHOL 180 07/21/2015   TRIG 62.0 07/21/2015   HDL 60.70 07/21/2015   LDLCALC 107* 07/21/2015   ALT 11 07/21/2015   AST 19 07/21/2015   NA 141 07/21/2015   K 4.1 07/21/2015   CL 102 07/21/2015   CREATININE 0.88 07/21/2015   BUN 23 07/21/2015   CO2 33* 07/21/2015   TSH 0.66 05/01/2015    Mm Digital Screening Bilateral  07/14/2015  CLINICAL DATA:  Screening. EXAM: DIGITAL SCREENING BILATERAL MAMMOGRAM WITH CAD COMPARISON:  Previous exam(s). ACR Breast Density Category b: There are scattered areas of fibroglandular density. FINDINGS: There are no findings suspicious for malignancy. Images were processed with CAD. IMPRESSION: No mammographic evidence of malignancy. A result letter of this screening mammogram will be mailed directly to the patient. RECOMMENDATION: Screening mammogram in one year. (Code:SM-B-01Y) BI-RADS CATEGORY  1: Negative. Electronically Signed   By: Margarette Canada M.D.   On: 07/14/2015 09:30       Assessment & Plan:   Problem List Items Addressed This Visit    GERD (gastroesophageal reflux disease)    EGD 10/02/14.  No upper symptoms.  On omeprazole.        Hypercholesterolemia    On pravastatin.  Low cholesterol diet and exercise.  Follow lipid panel and liver function tests.        Relevant Orders   Lipid panel   Hepatic function panel   Hypertension    Blood pressure under good control.  Continue same medication regimen.  Follow pressures.  Follow metabolic panel.        Relevant Orders   CBC with Differential/Platelet   TSH   Basic  metabolic panel   Migraine headache    Not reported as a significant issue now.  Has relpax.  Follow.        Relevant Medications   MELOXICAM PO   eletriptan (RELPAX) 20 MG tablet   Right shoulder pain    S/p injection.  Followed by ortho.  Pain improved.         Other Visit Diagnoses    Screening breast examination    -  Primary    Relevant Orders    MM DIGITAL SCREENING BILATERAL        Einar Pheasant, MD

## 2016-01-24 ENCOUNTER — Encounter: Payer: Self-pay | Admitting: Internal Medicine

## 2016-01-24 DIAGNOSIS — M25511 Pain in right shoulder: Secondary | ICD-10-CM | POA: Insufficient documentation

## 2016-01-24 NOTE — Assessment & Plan Note (Signed)
Not reported as a significant issue now.  Has relpax.  Follow.

## 2016-01-24 NOTE — Assessment & Plan Note (Signed)
On pravastatin.  Low cholesterol diet and exercise.  Follow lipid panel and liver function tests.   

## 2016-01-24 NOTE — Assessment & Plan Note (Signed)
Blood pressure under good control.  Continue same medication regimen.  Follow pressures.  Follow metabolic panel.   

## 2016-01-24 NOTE — Assessment & Plan Note (Signed)
S/p injection.  Followed by ortho.  Pain improved.

## 2016-01-24 NOTE — Assessment & Plan Note (Signed)
EGD 10/02/14.  No upper symptoms.  On omeprazole.

## 2016-01-25 ENCOUNTER — Other Ambulatory Visit (INDEPENDENT_AMBULATORY_CARE_PROVIDER_SITE_OTHER): Payer: BLUE CROSS/BLUE SHIELD

## 2016-01-25 DIAGNOSIS — E7800 Pure hypercholesterolemia, unspecified: Secondary | ICD-10-CM

## 2016-01-25 DIAGNOSIS — I10 Essential (primary) hypertension: Secondary | ICD-10-CM | POA: Diagnosis not present

## 2016-01-25 LAB — CBC WITH DIFFERENTIAL/PLATELET
Basophils Absolute: 0 10*3/uL (ref 0.0–0.1)
Basophils Relative: 0.8 % (ref 0.0–3.0)
Eosinophils Absolute: 0.1 10*3/uL (ref 0.0–0.7)
Eosinophils Relative: 1.7 % (ref 0.0–5.0)
HCT: 40.4 % (ref 36.0–46.0)
Hemoglobin: 13.5 g/dL (ref 12.0–15.0)
Lymphocytes Relative: 27.1 % (ref 12.0–46.0)
Lymphs Abs: 1.6 10*3/uL (ref 0.7–4.0)
MCHC: 33.4 g/dL (ref 30.0–36.0)
MCV: 92.2 fl (ref 78.0–100.0)
Monocytes Absolute: 0.6 10*3/uL (ref 0.1–1.0)
Monocytes Relative: 10.2 % (ref 3.0–12.0)
Neutro Abs: 3.5 10*3/uL (ref 1.4–7.7)
Neutrophils Relative %: 60.2 % (ref 43.0–77.0)
Platelets: 227 10*3/uL (ref 150.0–400.0)
RBC: 4.38 Mil/uL (ref 3.87–5.11)
RDW: 13.5 % (ref 11.5–15.5)
WBC: 5.8 10*3/uL (ref 4.0–10.5)

## 2016-01-25 LAB — LIPID PANEL
Cholesterol: 174 mg/dL (ref 0–200)
HDL: 63.2 mg/dL (ref >39.00)
LDL Cholesterol: 100 mg/dL — ABNORMAL HIGH (ref 0–99)
NonHDL: 111.05
Total CHOL/HDL Ratio: 3
Triglycerides: 55 mg/dL (ref 0.0–149.0)
VLDL: 11 mg/dL (ref 0.0–40.0)

## 2016-01-25 LAB — HEPATIC FUNCTION PANEL
ALT: 19 U/L (ref 0–35)
AST: 18 U/L (ref 0–37)
Albumin: 3.8 g/dL (ref 3.5–5.2)
Alkaline Phosphatase: 56 U/L (ref 39–117)
Bilirubin, Direct: 0.1 mg/dL (ref 0.0–0.3)
Total Bilirubin: 0.4 mg/dL (ref 0.2–1.2)
Total Protein: 6.4 g/dL (ref 6.0–8.3)

## 2016-01-25 LAB — BASIC METABOLIC PANEL
BUN: 22 mg/dL (ref 6–23)
CO2: 32 mEq/L (ref 19–32)
Calcium: 9.1 mg/dL (ref 8.4–10.5)
Chloride: 105 mEq/L (ref 96–112)
Creatinine, Ser: 0.72 mg/dL (ref 0.40–1.20)
GFR: 86.84 mL/min (ref >60.00)
Glucose, Bld: 90 mg/dL (ref 70–99)
Potassium: 4 mEq/L (ref 3.5–5.1)
Sodium: 142 mEq/L (ref 135–145)

## 2016-01-25 LAB — TSH: TSH: 2.75 u[IU]/mL (ref 0.35–4.50)

## 2016-01-26 ENCOUNTER — Encounter: Payer: Self-pay | Admitting: *Deleted

## 2016-01-27 ENCOUNTER — Telehealth: Payer: Self-pay

## 2016-01-27 NOTE — Telephone Encounter (Signed)
Started PA on Relpax, form being faxed to the office to complete.

## 2016-01-28 NOTE — Telephone Encounter (Signed)
Form received and completed and faxed back for PA

## 2016-02-12 ENCOUNTER — Telehealth: Payer: Self-pay | Admitting: Internal Medicine

## 2016-02-12 NOTE — Telephone Encounter (Signed)
Please advise that patient will be needing to be seen, but Dr. Nicki Reaper is out of the office till Tuesday.  Please ask patient to schedule an appt with another provider. Thanks

## 2016-02-12 NOTE — Telephone Encounter (Signed)
Pt hurt her ribs in October. She it healed but, she was making a bed this week and it hurts again. Pt is wondering if Dr. Nicki Reaper should look at it or have a x-ray.

## 2016-02-17 ENCOUNTER — Ambulatory Visit (INDEPENDENT_AMBULATORY_CARE_PROVIDER_SITE_OTHER): Payer: BLUE CROSS/BLUE SHIELD | Admitting: Family Medicine

## 2016-02-17 ENCOUNTER — Encounter: Payer: Self-pay | Admitting: Family Medicine

## 2016-02-17 ENCOUNTER — Ambulatory Visit (INDEPENDENT_AMBULATORY_CARE_PROVIDER_SITE_OTHER)
Admission: RE | Admit: 2016-02-17 | Discharge: 2016-02-17 | Disposition: A | Discharge status: Home / Self Care | Payer: BLUE CROSS/BLUE SHIELD | Source: Ambulatory Visit | Attending: Family Medicine | Admitting: Family Medicine

## 2016-02-17 VITALS — BP 108/64 | HR 77 | Temp 98.1°F | Ht 68.0 in | Wt 154.4 lb

## 2016-02-17 DIAGNOSIS — R0781 Pleurodynia: Secondary | ICD-10-CM | POA: Diagnosis not present

## 2016-02-17 MED ORDER — CYCLOBENZAPRINE HCL 5 MG PO TABS: 5.0000 mg | ORAL_TABLET | Freq: Three times a day (TID) | ORAL | Status: DC | PRN

## 2016-02-17 NOTE — Patient Instructions (Signed)
Nice to meet you. Your symptoms are likely related to a rib strain. We will obtain an x-ray to rule out fracture. You can try the Flexeril to see if this will help. She can also take meloxicam daily for this. If you develop chest pain, shortness of breath, fevers, difficulty breathing, or any new or changing symptoms please seek medical attention.

## 2016-02-17 NOTE — Progress Notes (Signed)
Pre visit review using our clinic review tool, if applicable. No additional management support is needed unless otherwise documented below in the visit note. 

## 2016-02-17 NOTE — Progress Notes (Signed)
Patient ID: Allison [name], female   DOB: 11/06/1950, 65 y.o.   MRN: [id number]  Allison Rumps, MD Phone: [phone]  Allison [name] is a 65 y.o. female who presents today for same-day visit.  Patient reports right anterior mid to lower rib discomfort for the last week or so that is a recurrence from discomfort she had in October. She notes in October she slipped in her truck and fell onto a metal rail and had some mild discomfort with this. This resolved on its own. She notes about a week ago she was reaching to get something and thinks she stretched wrong and she has had mild spasms in this area since then. She notes she only gets discomfort if she twists incorrectly. No bruising. No chest pain or shortness of breath. No fevers. She notes it is more of an aggravation than a pain. She has not taken any medications for this.   ROS see history of present illness  Objective  Physical Exam Filed Vitals:   02/17/16 0946  BP: 108/64  Pulse: 77  Temp: 98.1 F (36.7 C)    BP Readings from Last 3 Encounters:  02/17/16 108/64  01/22/16 110/80  07/13/15 118/70   Wt Readings from Last 3 Encounters:  02/17/16 154 lb 6.4 oz (70.035 kg)  01/22/16 157 lb 8 oz (71.442 kg)  07/13/15 138 lb (62.596 kg)    Physical Exam  Constitutional: She is well-developed, well-nourished, and in no distress.  HENT:  Head: Normocephalic and atraumatic.  Cardiovascular: Normal rate, regular rhythm and normal heart sounds.   Pulmonary/Chest: Effort normal and breath sounds normal. She exhibits no tenderness.  Right ribs with no sign of flail chest, no bony tenderness, no muscular tenderness, no bruising, no erythema, no swelling  Neurological: She is alert. Gait normal.  Skin: Skin is warm and dry. She is not diaphoretic.     Assessment/Plan: Please see individual problem list.  Rib pain on right side Patient with benign exam today. Suspect muscular strain from stretching. Given  prior injury we will obtain x-ray right ribs to rule out rib fracture. She can use meloxicam daily as needed. She can trial Flexeril as well for the discomfort. Given return precautions.    Orders Placed This Encounter  Procedures  . DG Ribs Unilateral Right    Standing Status: Future     Number of Occurrences:      Standing Expiration Date: 04/18/2017    Order Specific Question:  Reason for Exam (SYMPTOM  OR DIAGNOSIS REQUIRED)    Answer:  right rib pain, injury in October    Order Specific Question:  Preferred imaging location?    Answer:  [facility] ordered this encounter  Medications  . cyclobenzaprine (FLEXERIL) 5 MG tablet    Sig: Take 1 tablet (5 mg total) by mouth 3 (three) times daily as needed for muscle spasms.    Dispense:  10 tablet    Refill:  0    Allison Rumps, MD [place]

## 2016-02-17 NOTE — Assessment & Plan Note (Signed)
Patient with benign exam today. Suspect muscular strain from stretching. Given prior injury we will obtain x-ray right ribs to rule out rib fracture. She can use meloxicam daily as needed. She can trial Flexeril as well for the discomfort. Given return precautions.

## 2016-02-24 ENCOUNTER — Other Ambulatory Visit: Payer: Self-pay | Admitting: Internal Medicine

## 2016-02-24 NOTE — Telephone Encounter (Signed)
Last refilled on 12/11/15 for #30 with no refills. Pt last seen in March.

## 2016-02-24 NOTE — Telephone Encounter (Signed)
ok'd refill for xanax #30 with no refills.   

## 2016-02-26 NOTE — Telephone Encounter (Signed)
Rx faxed on 02/25/16

## 2016-03-11 ENCOUNTER — Telehealth: Payer: Self-pay | Admitting: *Deleted

## 2016-03-11 NOTE — Telephone Encounter (Signed)
Left a VM to return my call. 

## 2016-03-11 NOTE — Telephone Encounter (Signed)
Patient stated that she's currently out of town, she went to the restroom, she pasted a small amount of blood threw her bowel. She has requested to speak to the nurse about this issue.   [phone]

## 2016-03-16 NOTE — Telephone Encounter (Signed)
Spoke with the patient, she only had passed the blood one time, ate roasted nuts  And thinks she just agitated an area.  She has not had any issues since. Thanks

## 2016-03-16 NOTE — Telephone Encounter (Signed)
Attempted to reach patient and have closed note until she returns a call.

## 2016-04-13 ENCOUNTER — Telehealth: Payer: Self-pay | Admitting: Internal Medicine

## 2016-04-13 NOTE — Telephone Encounter (Signed)
Left message on phone to be scheduled an appointment with Dr. Nicki Reaper to receive an order of CIpro.

## 2016-04-13 NOTE — Telephone Encounter (Signed)
If mucus in her stool.  Needs to be seen.  Not able to call in abx without her being seen.

## 2016-04-13 NOTE — Telephone Encounter (Signed)
Refill request for Cipro, last [id number], last filled [id number].  Please advise.

## 2016-04-13 NOTE — Telephone Encounter (Signed)
Pt called about seeing mucus in her stool and wanted to know if ciprofloxacin (CIPRO) can be called into the pharmacy. Pt states Raquel Rey called that in for her before.   Pharmacy is [id number] - [place] [address] [place] [address].   Call pt @ [phone]. Pt says if she does not answer to leave msg. Thank you!

## 2016-04-14 ENCOUNTER — Telehealth: Payer: Self-pay | Admitting: *Deleted

## 2016-04-14 NOTE — Telephone Encounter (Deleted)
Void  

## 2016-04-14 NOTE — Telephone Encounter (Signed)
Please advise. Dr. Nicki Reaper or Caryl Bis does not have an active 30 minute slot for the [id number].

## 2016-04-14 NOTE — Telephone Encounter (Signed)
Left voicemail on patients phone stating that she has an appointment on [id number]  with Dr. Caryl Bis

## 2016-04-14 NOTE — Telephone Encounter (Signed)
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 Please advise a place on Dr. Bary Leriche schedule to work in patient, she also feels comfortable with Dr. Caryl Bis, please advise a place on Dr. Ellen Henri schedule for this day for a 30 min office visit. Pt contact [phone] Please leave a message if she does not answer.

## 2016-04-18 ENCOUNTER — Ambulatory Visit (INDEPENDENT_AMBULATORY_CARE_PROVIDER_SITE_OTHER): Payer: BLUE CROSS/BLUE SHIELD | Admitting: Family Medicine

## 2016-04-18 ENCOUNTER — Encounter: Payer: Self-pay | Admitting: Family Medicine

## 2016-04-18 VITALS — BP 104/74 | HR 79 | Temp 98.1°F | Ht 68.0 in | Wt 158.8 lb

## 2016-04-18 DIAGNOSIS — K5790 Diverticulosis of intestine, part unspecified, without perforation or abscess without bleeding: Secondary | ICD-10-CM | POA: Insufficient documentation

## 2016-04-18 DIAGNOSIS — R195 Other fecal abnormalities: Secondary | ICD-10-CM | POA: Diagnosis not present

## 2016-04-18 NOTE — Progress Notes (Signed)
Pre visit review using our clinic review tool, if applicable. No additional management support is needed unless otherwise documented below in the visit note. 

## 2016-04-18 NOTE — Patient Instructions (Signed)
Nice to see you. Mucus in your stool could be related to her constipation and possibly IBS. Please start using activia yogurt and if this is not beneficial even try over-the-counter MiraLAX. If you develop abdominal pain, nausea, vomiting, diarrhea, blood in her stool, fevers, or any other change in symptoms please seek medical attention.

## 2016-04-18 NOTE — Progress Notes (Signed)
Patient ID: Allison [name], female   DOB: 07/24/1951, 65 y.o.   MRN: [id number]  Tommi Rumps, MD Phone: [phone]  Allison [name] is a 64 y.o. female who presents today for same-day visit.  Patient notes over the last 3-4 weeks she has had clear mucus with some of her bowel movements. Notes bowel movement about every 3 days. Notes the frequency of her bowel movements has been this way her entire life. Notes mucus is clear. Had a small amount of blood 3-4 weeks ago after eating some nuts and thought she irritated the area. No fevers, diarrhea, nausea, or vomiting. Notes when she has her bowel movements they're typically hard balls. No recent travel or fevers. No abdominal pain though occasionally when she has mucous she notes an irritated feeling in her left lower quadrant. Had a normal colonoscopy 09/2014. In the past and had this and was placed on Cipro.  PMH: nonsmoker.   ROS see history of present illness  Objective  Physical Exam Filed Vitals:   04/18/16 0824  BP: 104/74  Pulse: 79  Temp: 98.1 F (36.7 C)    BP Readings from Last 3 Encounters:  04/18/16 104/74  02/17/16 108/64  01/22/16 110/80   Wt Readings from Last 3 Encounters:  04/18/16 158 lb 12.8 oz (72.031 kg)  02/17/16 154 lb 6.4 oz (70.035 kg)  01/22/16 157 lb 8 oz (71.442 kg)    Physical Exam  Constitutional: She is well-developed, well-nourished, and in no distress.  HENT:  Head: Normocephalic and atraumatic.  Right Ear: External ear normal.  Left Ear: External ear normal.  Cardiovascular: Normal rate, regular rhythm and normal heart sounds.   Pulmonary/Chest: Effort normal and breath sounds normal.  Abdominal: Soft. Bowel sounds are normal. She exhibits no distension. There is no tenderness. There is no rebound and no guarding.  Genitourinary:  Offered rectal exam though patient deferred  Neurological: She is alert. Gait normal.  Skin: Skin is warm and dry. She is not diaphoretic.      Assessment/Plan: Please see individual problem list.  Mucus in stool Onset over the last 3-4 weeks. No abdominal pain, nausea, vomiting, or diarrhea though does report some constipation though the frequency of her bowel movements has not changed during her lifetime. 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 She had a negative colonoscopy a year and a half ago. Discussed treating constipation and seeing if this got better. Advised that if mucous persisted or if she developed other episodes of bleeding she would need reevaluation. She will try activia yogurt at her request first and if not improving with this we'll try MiraLAX. She'll follow-up in a month if continuing to have symptoms. She requested if symptoms are improved that she be able to call and let us know this. She's given return precautions.     Tommi Rumps, MD [place]

## 2016-04-18 NOTE — Assessment & Plan Note (Signed)
Onset over the last 3-4 weeks. No abdominal pain, nausea, vomiting, or diarrhea though does report some constipation though the frequency of her bowel movements has not changed during her lifetime. 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 She had a negative colonoscopy a year and a half ago. Discussed treating constipation and seeing if this got better. Advised that if mucous persisted or if she developed other episodes of bleeding she would need reevaluation. She will try activia yogurt at her request first and if not improving with this we'll try MiraLAX. She'll follow-up in a month if continuing to have symptoms. She requested if symptoms are improved that she be able to call and let us know this. She's given return precautions.

## 2016-04-19 ENCOUNTER — Ambulatory Visit: Payer: BLUE CROSS/BLUE SHIELD | Admitting: Family Medicine

## 2016-05-15 ENCOUNTER — Other Ambulatory Visit: Payer: Self-pay | Admitting: Internal Medicine

## 2016-05-16 NOTE — Telephone Encounter (Signed)
ok'd refill for xanax #30 with no refills.   

## 2016-05-16 NOTE — Telephone Encounter (Signed)
Rx faxed to [facility].

## 2016-07-15 ENCOUNTER — Telehealth: Payer: Self-pay | Admitting: Internal Medicine

## 2016-07-15 ENCOUNTER — Encounter: Payer: Self-pay | Admitting: Internal Medicine

## 2016-07-15 ENCOUNTER — Ambulatory Visit (INDEPENDENT_AMBULATORY_CARE_PROVIDER_SITE_OTHER)
Admission: RE | Admit: 2016-07-15 | Discharge: 2016-07-15 | Disposition: A | Discharge status: Home / Self Care | Payer: BLUE CROSS/BLUE SHIELD | Source: Ambulatory Visit | Attending: Internal Medicine | Admitting: Internal Medicine

## 2016-07-15 ENCOUNTER — Ambulatory Visit
Admission: RE | Admit: 2016-07-15 | Discharge: 2016-07-15 | Disposition: A | Discharge status: Home / Self Care | Payer: BLUE CROSS/BLUE SHIELD | Source: Ambulatory Visit | Attending: Internal Medicine | Admitting: Internal Medicine

## 2016-07-15 ENCOUNTER — Ambulatory Visit (INDEPENDENT_AMBULATORY_CARE_PROVIDER_SITE_OTHER): Payer: BLUE CROSS/BLUE SHIELD | Admitting: Internal Medicine

## 2016-07-15 VITALS — BP 116/76 | HR 73 | Temp 97.6°F | Ht 68.0 in | Wt 158.8 lb

## 2016-07-15 DIAGNOSIS — Z23 Encounter for immunization: Secondary | ICD-10-CM | POA: Diagnosis not present

## 2016-07-15 DIAGNOSIS — M79671 Pain in right foot: Secondary | ICD-10-CM | POA: Diagnosis not present

## 2016-07-15 DIAGNOSIS — M25511 Pain in right shoulder: Secondary | ICD-10-CM

## 2016-07-15 DIAGNOSIS — E7800 Pure hypercholesterolemia, unspecified: Secondary | ICD-10-CM

## 2016-07-15 DIAGNOSIS — Z1239 Encounter for other screening for malignant neoplasm of breast: Secondary | ICD-10-CM

## 2016-07-15 DIAGNOSIS — M79674 Pain in right toe(s): Secondary | ICD-10-CM | POA: Diagnosis not present

## 2016-07-15 DIAGNOSIS — Z1231 Encounter for screening mammogram for malignant neoplasm of breast: Secondary | ICD-10-CM | POA: Diagnosis not present

## 2016-07-15 DIAGNOSIS — I10 Essential (primary) hypertension: Secondary | ICD-10-CM | POA: Diagnosis not present

## 2016-07-15 DIAGNOSIS — Z0000 Encounter for general adult medical examination without abnormal findings: Secondary | ICD-10-CM

## 2016-07-15 DIAGNOSIS — K219 Gastro-esophageal reflux disease without esophagitis: Secondary | ICD-10-CM

## 2016-07-15 DIAGNOSIS — R195 Other fecal abnormalities: Secondary | ICD-10-CM

## 2016-07-15 LAB — LIPID PANEL
Cholesterol: 184 mg/dL (ref 0–200)
HDL: 61.8 mg/dL (ref >39.00)
LDL Cholesterol: 111 mg/dL — ABNORMAL HIGH (ref 0–99)
NonHDL: 122.67
Total CHOL/HDL Ratio: 3
Triglycerides: 57 mg/dL (ref 0.0–149.0)
VLDL: 11.4 mg/dL (ref 0.0–40.0)

## 2016-07-15 LAB — BASIC METABOLIC PANEL
BUN: 15 mg/dL (ref 6–23)
CO2: 34 mEq/L — ABNORMAL HIGH (ref 19–32)
Calcium: 9.1 mg/dL (ref 8.4–10.5)
Chloride: 100 mEq/L (ref 96–112)
Creatinine, Ser: 0.78 mg/dL (ref 0.40–1.20)
GFR: 79.06 mL/min (ref >60.00)
Glucose, Bld: 90 mg/dL (ref 70–99)
Potassium: 3.9 mEq/L (ref 3.5–5.1)
Sodium: 141 mEq/L (ref 135–145)

## 2016-07-15 LAB — HEPATIC FUNCTION PANEL
ALT: 13 U/L (ref 0–35)
AST: 19 U/L (ref 0–37)
Albumin: 4 g/dL (ref 3.5–5.2)
Alkaline Phosphatase: 65 U/L (ref 39–117)
Bilirubin, Direct: 0 mg/dL (ref 0.0–0.3)
Total Bilirubin: 0.5 mg/dL (ref 0.2–1.2)
Total Protein: 7.1 g/dL (ref 6.0–8.3)

## 2016-07-15 MED ORDER — CIPROFLOXACIN HCL 500 MG PO TABS: 500.0000 mg | ORAL_TABLET | Freq: Two times a day (BID) | ORAL | 0 refills | Status: DC

## 2016-07-15 NOTE — Progress Notes (Signed)
Pre visit review using our clinic review tool, if applicable. No additional management support is needed unless otherwise documented below in the visit note. 

## 2016-07-15 NOTE — Telephone Encounter (Signed)
Attempted to reach patient again, left VM.

## 2016-07-15 NOTE — Telephone Encounter (Signed)
Pt called returning your call regarding lab results.

## 2016-07-15 NOTE — Assessment & Plan Note (Signed)
On pravastatin.  Low cholesterol diet and exercise.  Follow lipid panel and liver function tests.   

## 2016-07-15 NOTE — Assessment & Plan Note (Addendum)
Physical today 07/15/16.  Mammogram 07/14/15 - Birads I.  Had mammogram this am.  Colonoscopy 09/2014.  Recommended f/u colonoscopy in five years.  PAP 07/13/15 - negative with negative HPV.

## 2016-07-15 NOTE — Assessment & Plan Note (Signed)
Saw ortho.  Felt to have impingement syndrome.  S/p injection.  Doing well.

## 2016-07-15 NOTE — Progress Notes (Signed)
Patient ID: Allison [name], female   DOB: Aug 05, 1951, 65 y.o.   MRN: [id number]   Subjective:    Patient ID: Allison [name], female    DOB: 1951-01-26, 65 y.o.   MRN: [id number]  HPI  Patient here for her physical exam.  States she is doing well.  Feels good.  Tries to stay active.  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  No chest pain.  No sob.  No acid reflux.  No abdominal pain or cramping.  Bowels stable.  Previous right shoulder pain.  Better.  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  Stable.     Past Medical History:  Diagnosis Date  . GERD (gastroesophageal reflux disease)   . Hypercholesterolemia   . Hypertension   . Migraine headache   . Psoriasis    Past Surgical History:  Procedure Laterality Date  . DILATION AND CURETTAGE OF UTERUS     history of abnormal bleeding  . TUBAL LIGATION     Family History  Problem Relation Age of Onset  . Asthma Father   . Diabetes Mother   . CVA Mother   . Psoriasis Mother   . Breast cancer Maternal Grandmother   . Breast cancer Paternal Grandmother   . Colon cancer Maternal Uncle   . Colon cancer      nephew  . Diabetes      multiple relatives (both sides)   Social History   Social History  . Marital status: Single    Spouse name: N/A  . Number of children: 1  . Years of education: N/A   Social History Main Topics  . Smoking status: Never Smoker  . Smokeless tobacco: Never Used  . Alcohol use No  . Drug use: No  . Sexual activity: Not Asked   Other Topics Concern  . None   Social History Narrative  . None    Outpatient Encounter Prescriptions as of 07/15/2016  Medication Sig  . ALPRAZolam (XANAX) 0.25 MG tablet TAKE ONE TABLET BY MOUTH ONCE DAILY AS NEEDED  . cyclobenzaprine (FLEXERIL) 5 MG tablet Take 1 tablet (5 mg total) by mouth 3 (three) times daily as needed for muscle spasms.  Marland Kitchen eletriptan (RELPAX) 20 MG tablet Take 1 tablet (20 mg total) by mouth as needed. may repeat in 2 hours if necessary  .  gentamicin (GARAMYCIN) 0.3 % ophthalmic solution Place 2 drops into the left eye every 6 (six) hours.  . hydrochlorothiazide (MICROZIDE) 12.5 MG capsule TAKE ONE CAPSULE BY MOUTH ONCE DAILY  . MELOXICAM PO Take by mouth.  Marland Kitchen omeprazole (PRILOSEC) 20 MG capsule Take 20 mg by mouth daily.  . pravastatin (PRAVACHOL) 10 MG tablet TAKE ONE TABLET BY MOUTH ONCE DAILY  . ciprofloxacin (CIPRO) 500 MG tablet Take 1 tablet (500 mg total) by mouth 2 (two) times daily.   No facility-administered encounter medications on file as of 07/15/2016.     Review of Systems  Constitutional: Negative for appetite change and unexpected weight change.  HENT: Negative for congestion and sinus pressure.   Eyes: Negative for pain and visual disturbance.  Respiratory: Negative for cough, chest tightness and shortness of breath.   Cardiovascular: Negative for chest pain, palpitations and leg swelling.  Gastrointestinal: Negative for abdominal pain, diarrhea, nausea and vomiting.  Genitourinary: Negative for difficulty urinating and dysuria.  Musculoskeletal: Negative for back pain and joint swelling.  Skin: Negative for color change and rash.  Neurological: Negative for dizziness and headaches.  Hematological: Negative  for adenopathy. Does not bruise/bleed easily.  Psychiatric/Behavioral: Negative for agitation and dysphoric mood.       Objective:    Physical Exam  Constitutional: She is oriented to person, place, and time. She appears well-developed and well-nourished. No distress.  HENT:  Nose: Nose normal.  Mouth/Throat: Oropharynx is clear and moist.  Eyes: Right eye exhibits no discharge. Left eye exhibits no discharge. No scleral icterus.  Neck: Neck supple. No thyromegaly present.  Cardiovascular: Normal rate and regular rhythm.   Pulmonary/Chest: Breath sounds normal. No accessory muscle usage. No tachypnea. No respiratory distress. She has no decreased breath sounds. She has no wheezes. She has no  rhonchi. Right breast exhibits no inverted nipple, no mass, no nipple discharge and no tenderness (no axillary adenopathy). Left breast exhibits no inverted nipple, no mass, no nipple discharge and no tenderness (no axilarry adenopathy).  Abdominal: Soft. Bowel sounds are normal. There is no tenderness.  Musculoskeletal: She exhibits no edema or tenderness.  Lymphadenopathy:    She has no cervical adenopathy.  Neurological: She is alert and oriented to person, place, and time.  Skin: Skin is warm. No rash noted. No erythema.  Psychiatric: She has a normal mood and affect. Her behavior is normal.    BP 116/76   Pulse 73   Temp 97.6 F (36.4 C) (Oral)   Ht 5\' 8"  (1.727 m)   Wt 158 lb 12.8 oz (72 kg)   SpO2 99%   BMI 24.15 kg/m  Wt Readings from Last 3 Encounters:  07/15/16 158 lb 12.8 oz (72 kg)  04/18/16 158 lb 12.8 oz (72 kg)  02/17/16 154 lb 6.4 oz (70 kg)     Lab Results  Component Value Date   WBC 5.8 01/25/2016   HGB 13.5 01/25/2016   HCT 40.4 01/25/2016   PLT 227.0 01/25/2016   GLUCOSE 90 07/15/2016   CHOL 184 07/15/2016   TRIG 57.0 07/15/2016   HDL 61.80 07/15/2016   LDLCALC 111 (H) 07/15/2016   ALT 13 07/15/2016   AST 19 07/15/2016   NA 141 07/15/2016   K 3.9 07/15/2016   CL 100 07/15/2016   CREATININE 0.78 07/15/2016   BUN 15 07/15/2016   CO2 34 (H) 07/15/2016   TSH 2.75 01/25/2016       Assessment & Plan:   Problem List Items Addressed This Visit    GERD (gastroesophageal reflux disease)    On omeprazole.  Stable.       Health care maintenance    Physical today 07/15/16.  Mammogram 07/14/15 - Birads I.  Had mammogram this am.  Colonoscopy 09/2014.  Recommended f/u colonoscopy in five years.  PAP 07/13/15 - negative with negative HPV.        Hypercholesterolemia    On pravastatin.  Low cholesterol diet and exercise.  Follow lipid panel and liver function tests.        Relevant Orders   Lipid panel (Completed)   Hepatic function panel (Completed)     Hypertension    Blood pressure under good control.  Continue same medication regimen.  Follow pressures.  Follow metabolic panel.        Relevant Orders   Basic metabolic panel (Completed)   Mucus in stool    Resolved.  Follow.        Right shoulder pain    Saw ortho.  Felt to have impingement syndrome.  S/p injection.  Doing well.         Other Visit Diagnoses  Routine general medical examination at a health care facility    -  Primary   Encounter for immunization       Toe pain, right       Relevant Orders   DG Foot 2 Views Right (Completed)   Right foot pain       Relevant Orders   DG Foot 2 Views Right (Completed)       Einar Pheasant, MD

## 2016-07-15 NOTE — Assessment & Plan Note (Signed)
Blood pressure under good control.  Continue same medication regimen.  Follow pressures.  Follow metabolic panel.   

## 2016-07-16 ENCOUNTER — Encounter: Payer: Self-pay | Admitting: Internal Medicine

## 2016-07-16 NOTE — Assessment & Plan Note (Signed)
On omeprazole.  Stable.

## 2016-07-16 NOTE — Assessment & Plan Note (Signed)
Resolved. Follow °

## 2016-07-19 NOTE — Telephone Encounter (Signed)
Pt will be at [phone]

## 2016-07-19 NOTE — Telephone Encounter (Signed)
Called and gave results, see result note, thanks

## 2016-07-31 ENCOUNTER — Other Ambulatory Visit: Payer: Self-pay | Admitting: Internal Medicine

## 2016-08-01 NOTE — Telephone Encounter (Signed)
Last filled 05/16/16 30 no rf

## 2016-09-13 ENCOUNTER — Telehealth: Payer: Self-pay | Admitting: *Deleted

## 2016-09-13 ENCOUNTER — Telehealth: Payer: Self-pay | Admitting: Internal Medicine

## 2016-09-13 NOTE — Telephone Encounter (Signed)
[facility]  Patient Name: Allison [name]  DOB: January 31, 1951    Initial Comment Janett Billow with [facility] needing a PT triage. PT: passing blood in stool, history of issues that flare up, but not sure if they are related. No bowel movements today, tried but only got a little bit blood. Prescription taken, helping, but still passing blood every 20-30 minutes with mucus looking. Not anything solid put in her stomach.    Nurse Assessment  Nurse: Adella Nissen, RN, Leitha Schuller Date/Time ([place] Time): 09/13/2016 3:02:01 PM  Confirm and document reason for call. If symptomatic, describe symptoms. You must click the next button to save text entered. 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 ---Yes  Will a triage be completed? ---Yes  Related visit to physician within the last 2 weeks? ---No  Does the PT have any chronic conditions? (i.e. diabetes, asthma, etc.) ---Yes  List chronic conditions. ---hypertension  Is this a behavioral health or substance abuse call? ---No     Guidelines    Guideline Title Affirmed Question Affirmed Notes  Rectal Bleeding MODERATE rectal bleeding (small blood clots, passing blood without stool, or toilet water turns red)    Final Disposition User   See Physician within [place], RN, Leitha Schuller    Comments  Appt scheduled for patient on 09/14/16 @ 8:30am with Dr. Tommi Rumps at the [facility] office.   Referrals  REFERRED TO PCP OFFICE   Disagree/Comply: Comply

## 2016-09-13 NOTE — Telephone Encounter (Signed)
Please call.

## 2016-09-13 NOTE — Telephone Encounter (Signed)
Noted, thanks!

## 2016-09-13 NOTE — Telephone Encounter (Signed)
FYI : Pt was transferred to the nurse line, she currently hah been passing blood in ger stool.

## 2016-09-14 ENCOUNTER — Ambulatory Visit (INDEPENDENT_AMBULATORY_CARE_PROVIDER_SITE_OTHER): Payer: BLUE CROSS/BLUE SHIELD | Admitting: Family Medicine

## 2016-09-14 ENCOUNTER — Ambulatory Visit
Admission: RE | Admit: 2016-09-14 | Discharge: 2016-09-14 | Disposition: A | Discharge status: Home / Self Care | Payer: BLUE CROSS/BLUE SHIELD | Source: Ambulatory Visit | Attending: Family Medicine | Admitting: Family Medicine

## 2016-09-14 ENCOUNTER — Encounter: Payer: Self-pay | Admitting: Family Medicine

## 2016-09-14 ENCOUNTER — Telehealth: Payer: Self-pay | Admitting: *Deleted

## 2016-09-14 VITALS — BP 108/72 | HR 85 | Temp 98.4°F | Wt 162.0 lb

## 2016-09-14 DIAGNOSIS — K5790 Diverticulosis of intestine, part unspecified, without perforation or abscess without bleeding: Secondary | ICD-10-CM | POA: Insufficient documentation

## 2016-09-14 DIAGNOSIS — I700 Atherosclerosis of aorta: Secondary | ICD-10-CM | POA: Insufficient documentation

## 2016-09-14 DIAGNOSIS — R1032 Left lower quadrant pain: Secondary | ICD-10-CM

## 2016-09-14 DIAGNOSIS — N281 Cyst of kidney, acquired: Secondary | ICD-10-CM | POA: Diagnosis not present

## 2016-09-14 LAB — COMPREHENSIVE METABOLIC PANEL
ALT: 12 U/L (ref 0–35)
AST: 20 U/L (ref 0–37)
Albumin: 4.1 g/dL (ref 3.5–5.2)
Alkaline Phosphatase: 69 U/L (ref 39–117)
BUN: 12 mg/dL (ref 6–23)
CO2: 32 mEq/L (ref 19–32)
Calcium: 9.2 mg/dL (ref 8.4–10.5)
Chloride: 103 mEq/L (ref 96–112)
Creatinine, Ser: 0.82 mg/dL (ref 0.40–1.20)
GFR: 74.59 mL/min (ref >60.00)
Glucose, Bld: 102 mg/dL — ABNORMAL HIGH (ref 70–99)
Potassium: 4 mEq/L (ref 3.5–5.1)
Sodium: 141 mEq/L (ref 135–145)
Total Bilirubin: 0.5 mg/dL (ref 0.2–1.2)
Total Protein: 7 g/dL (ref 6.0–8.3)

## 2016-09-14 LAB — CBC
HCT: 43 % (ref 36.0–46.0)
Hemoglobin: 14.6 g/dL (ref 12.0–15.0)
MCHC: 33.9 g/dL (ref 30.0–36.0)
MCV: 89.8 fl (ref 78.0–100.0)
Platelets: 254 10*3/uL (ref 150.0–400.0)
RBC: 4.79 Mil/uL (ref 3.87–5.11)
RDW: 13.4 % (ref 11.5–15.5)
WBC: 6.3 10*3/uL (ref 4.0–10.5)

## 2016-09-14 LAB — POCT I-STAT CREATININE: Creatinine, Ser: 0.9 mg/dL (ref 0.44–1.00)

## 2016-09-14 MED ORDER — CIPROFLOXACIN HCL 500 MG PO TABS: 500.0000 mg | ORAL_TABLET | Freq: Two times a day (BID) | ORAL | 0 refills | Status: DC

## 2016-09-14 MED ORDER — IOPAMIDOL (ISOVUE-300) INJECTION 61%
100.0000 mL | Freq: Once | INTRAVENOUS | Status: AC | PRN
  Administered 2016-09-14: 100 mL via INTRAVENOUS

## 2016-09-14 MED ORDER — METRONIDAZOLE 500 MG PO TABS: 500.0000 mg | ORAL_TABLET | Freq: Three times a day (TID) | ORAL | 0 refills | Status: DC

## 2016-09-14 NOTE — Progress Notes (Signed)
Pre visit review using our clinic review tool, if applicable. No additional management support is needed unless otherwise documented below in the visit note. 

## 2016-09-14 NOTE — Progress Notes (Addendum)
Tommi Rumps, MD Phone: [phone]  Allison [name] is a 65 y.o. female who presents today for same-day visit.  Patient notes she had been doing well until yesterday when she had a small amount of bloody mucus coming out of her rectum. Notes she didn't have anything else come out. She had no stool at that time. Notes then she was oozing clear mucus later in the day. Had a small brown bowel movement yesterday. Notes some mild soreness in the left lower quadrant. No nausea or vomiting. No fevers. Has had this previously and was treated with antibiotics. Most recent colonoscopy with no diverticulosis.  PMH: nonsmoker.   ROS see history of present illness  Objective  Physical Exam Vitals:   09/14/16 0826  BP: 108/72  Pulse: 85  Temp: 98.4 F (36.9 C)    BP Readings from Last 3 Encounters:  09/14/16 108/72  07/15/16 116/76  04/18/16 104/74   Wt Readings from Last 3 Encounters:  09/14/16 162 lb (73.5 kg)  07/15/16 158 lb 12.8 oz (72 kg)  04/18/16 158 lb 12.8 oz (72 kg)    Physical Exam  Constitutional: She is well-developed, well-nourished, and in no distress.  Cardiovascular: Normal rate, regular rhythm and normal heart sounds.   Pulmonary/Chest: Effort normal and breath sounds normal.  Abdominal: Soft. Bowel sounds are normal. She exhibits no distension. There is tenderness (mild soreness in LLQ). There is no rebound and no guarding.  Genitourinary: Rectal exam shows guaiac positive stool.  Genitourinary Comments: Normal-appearing rectum with stool in the rectal vault  Neurological: She is alert. Gait normal.  Skin: Skin is warm.     Assessment/Plan: Please see individual problem list.  LLQ abdominal pain Patient with mild soreness in her left lower quadrant that was preceded by mucousy stools with blood tinges. She mildly sore on exam today. There are no peritoneal signs. Rectal exam performed with no gross blood though she was FOBT positive. 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 Could be infectious in nature. Given symptoms and exam findings we will proceed with CT abdomen and pelvis to evaluate further. We will proceed with treatment for infectious cause with ciprofloxacin and Flagyl. Discussed liquid diet for several days. She will follow-up early next week for reevaluation. She is advised that if she develops any new symptoms or worsening symptoms she needs to go to the emergency room. She is given return precautions.   Orders Placed This Encounter  Procedures  . CT Abdomen Pelvis W Contrast    Standing Status:   Future    Number of Occurrences:   1    Standing Expiration Date:   12/15/2017    Order Specific Question:   If indicated for the ordered procedure, I authorize the administration of contrast media per Radiology protocol    Answer:   Yes    Order Specific Question:   Reason for Exam (SYMPTOM  OR DIAGNOSIS REQUIRED)    Answer:   abdominal soreness LLQ, mucous in stools with blood tinge    Order Specific Question:   Preferred imaging location?    Answer:   [facility]  . CBC  . Comp Met (CMET)    Meds ordered this encounter  Medications  . ciprofloxacin (CIPRO) 500 MG tablet    Sig: Take 1 tablet (500 mg total) by mouth 2 (two) times daily.    Dispense:  14 tablet    Refill:  0  . metroNIDAZOLE (FLAGYL) 500  MG tablet    Sig: Take 1 tablet (500 mg total) by mouth 3 (three) times daily.    Dispense:  21 tablet    Refill:  0   Tommi Rumps, MD [place]

## 2016-09-14 NOTE — Telephone Encounter (Signed)
Called and spoke with patient regarding her CT scan results. Possible mild thickening in the colon. We'll proceed with treatment as prescribed earlier with ciprofloxacin and Flagyl. Discussed that she could take in a little bit of food with her pills if needed. Mostly liquid diet. She is given return precautions.

## 2016-09-14 NOTE — Telephone Encounter (Signed)
Please advise 

## 2016-09-14 NOTE — Telephone Encounter (Signed)
Pt requested her CT results, if they are available  Pt contact [phone]

## 2016-09-14 NOTE — Assessment & Plan Note (Signed)
Patient with mild soreness in her left lower quadrant that was preceded by mucousy stools with blood tinges. She mildly sore on exam today. There are no peritoneal signs. Rectal exam performed with no gross blood though she was FOBT positive. 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 Could be infectious in nature. Given symptoms and exam findings we will proceed with CT abdomen and pelvis to evaluate further. We will proceed with treatment for infectious cause with ciprofloxacin and Flagyl. Discussed liquid diet for several days. She will follow-up early next week for reevaluation. She is advised that if she develops any new symptoms or worsening symptoms she needs to go to the emergency room. She is given return precautions.

## 2016-09-14 NOTE — Patient Instructions (Signed)
Nice to see you. We are going to get a CT scan of your abdomen and pelvis to evaluate. We will start you on antibiotics. We will obtain lab work and we'll call with the results. If you have worsening abdominal discomfort, continued blood from your rectum, fevers, or any new or change in symptoms please seek medical attention immediately.

## 2016-09-19 ENCOUNTER — Encounter: Payer: Self-pay | Admitting: Family Medicine

## 2016-09-19 ENCOUNTER — Ambulatory Visit (INDEPENDENT_AMBULATORY_CARE_PROVIDER_SITE_OTHER): Payer: BLUE CROSS/BLUE SHIELD | Admitting: Family Medicine

## 2016-09-19 ENCOUNTER — Telehealth: Payer: Self-pay | Admitting: Internal Medicine

## 2016-09-19 VITALS — BP 112/76 | HR 71 | Temp 98.2°F | Wt 164.4 lb

## 2016-09-19 DIAGNOSIS — Q6211 Congenital occlusion of ureteropelvic junction: Secondary | ICD-10-CM | POA: Diagnosis not present

## 2016-09-19 DIAGNOSIS — K921 Melena: Secondary | ICD-10-CM | POA: Diagnosis not present

## 2016-09-19 DIAGNOSIS — R1032 Left lower quadrant pain: Secondary | ICD-10-CM

## 2016-09-19 NOTE — Assessment & Plan Note (Signed)
Significantly improved. She will continue her antibiotics and finish the course. She can advance her diet slowly. We will refer to GI given the blood in her stool previously. Given return precautions.

## 2016-09-19 NOTE — Progress Notes (Signed)
Pre visit review using our clinic review tool, if applicable. No additional management support is needed unless otherwise documented below in the visit note. 

## 2016-09-19 NOTE — Patient Instructions (Signed)
Nice to see you. I'm glad you're feeling better. Please complete your antibiotic course. We'll get you in to see GI for further evaluation. If you develop abdominal pain, blood in your stool, fevers, nausea, vomiting, or any new new or changing symptoms please seek medical attention.

## 2016-09-19 NOTE — Assessment & Plan Note (Signed)
Refer to urology.  ?

## 2016-09-19 NOTE — Progress Notes (Signed)
  Tommi Rumps, MD Phone: [phone]  Allison [name] is a 65 y.o. female who presents today for follow-up.  Patient was seen last week for colitis. Suspected infectious cause and treated with ciprofloxacin and Flagyl. She notes she has been improving. No blood or mucus in her stool. Is having some soft brown bowel movements. Still mildly sore though this is improving as well. No fevers. No nausea or vomiting. Has never seen GI other than with her colonoscopies. She did note after having the CT scan that her left tonsil became tingly. She notes no other tingly sensations in her throat. No tongue swelling, lip swelling, throat swelling, or trouble breathing. She went back and was evaluated by the radiologist per her report. Notes this symptom resolved throughout the rest of the day. CT scan also noted mild right UPJ narrowing. She is unsure if she has seen urology in the past. There are also multiple left parapelvic renal cysts.  ROS see history of present illness  Objective  Physical Exam Vitals:   09/19/16 1308  BP: 112/76  Pulse: 71  Temp: 98.2 F (36.8 C)    BP Readings from Last 3 Encounters:  09/19/16 112/76  09/14/16 108/72  07/15/16 116/76   Wt Readings from Last 3 Encounters:  09/19/16 164 lb 6.4 oz (74.6 kg)  09/14/16 162 lb (73.5 kg)  07/15/16 158 lb 12.8 oz (72 kg)    Physical Exam  Constitutional: She is well-developed, well-nourished, and in no distress.  HENT:  Mouth/Throat: Oropharynx is clear and moist. No oropharyngeal exudate.  Neck: Neck supple.  Cardiovascular: Normal rate, regular rhythm and normal heart sounds.   Pulmonary/Chest: Effort normal and breath sounds normal.  Abdominal: Soft. Bowel sounds are normal. She exhibits no distension. There is no rebound and no guarding.  Minimal soreness left lower quadrant  Lymphadenopathy:    She has no cervical adenopathy.  Neurological: She is alert. Gait normal.  Skin: Skin is warm and dry.      Assessment/Plan: Please see individual problem list.  LLQ abdominal pain Significantly improved. She will continue her antibiotics and finish the course. She can advance her diet slowly. We will refer to GI given the blood in her stool previously. Given return precautions.  UPJ narrowing Refer to urology.   Orders Placed This Encounter  Procedures  . Ambulatory referral to Gastroenterology    Referral Priority:   Routine    Referral Type:   Consultation    Referral Reason:   Specialty Services Required    Number of Visits Requested:   1  . Ambulatory referral to Urology    Referral Priority:   Routine    Referral Type:   Consultation    Referral Reason:   Specialty Services Required    Requested Specialty:   Urology    Number of Visits Requested:   [place], MD [place]

## 2016-09-19 NOTE — Telephone Encounter (Signed)
FYI - Pt called and asked if we could get her in for appts for the gastroenterologist and urologist between 12/26 to March.

## 2016-09-21 ENCOUNTER — Emergency Department: Admit: 2016-09-21 | Payer: BLUE CROSS/BLUE SHIELD

## 2016-09-21 ENCOUNTER — Inpatient Hospital Stay
Admit: 2016-09-21 | Discharge: 2016-09-21 | Disposition: A | Discharge status: Home / Self Care | Payer: BLUE CROSS/BLUE SHIELD

## 2016-09-21 DIAGNOSIS — K922 Gastrointestinal hemorrhage, unspecified: Secondary | ICD-10-CM

## 2016-09-21 LAB — COMPREHENSIVE METABOLIC PANEL
ALT: 19 U/L (ref 5–33)
AST: 33 U/L — ABNORMAL HIGH (ref 5–32)
Albumin: 4.2 g/dL (ref 3.5–5.2)
Alkaline Phosphatase: 71 U/L (ref 35–104)
Anion Gap: 10 mmol/L (ref 7–19)
BUN: 13 mg/dL (ref 8–23)
CO2: 30 mmol/L — ABNORMAL HIGH (ref 22–29)
Calcium: 9.3 mg/dL (ref 8.8–10.2)
Chloride: 102 mmol/L (ref 98–111)
Creatinine: 0.8 mg/dL (ref 0.5–0.9)
GFR Non-African American: >60 (ref >60)
Glucose: 115 mg/dL — ABNORMAL HIGH (ref 74–109)
Potassium: 4.2 mmol/L (ref 3.5–5.0)
Sodium: 142 mmol/L (ref 136–145)
Total Bilirubin: 0.5 mg/dL (ref 0.2–1.2)
Total Protein: 7.5 g/dL (ref 6.6–8.7)

## 2016-09-21 LAB — CBC WITH AUTO DIFFERENTIAL
Basophils %: 0.9 % (ref 0.0–1.0)
Basophils Absolute: 0.1 10*3/uL (ref 0.00–0.20)
Eosinophils %: 1.1 % (ref 0.0–5.0)
Eosinophils Absolute: 0.1 10*3/uL (ref 0.00–0.60)
Hematocrit: 44.3 % (ref 37.0–47.0)
Hemoglobin: 14.9 g/dL (ref 12.0–16.0)
Lymphocytes %: 11.6 % — ABNORMAL LOW (ref 20.0–40.0)
Lymphocytes Absolute: 0.9 10*3/uL — ABNORMAL LOW (ref 1.1–4.5)
MCH: 30.7 pg (ref 27.0–31.0)
MCHC: 33.6 g/dL (ref 33.0–37.0)
MCV: 91.3 fL (ref 81.0–99.0)
MPV: 10.3 fL (ref 9.4–12.3)
Monocytes %: 7.2 % (ref 0.0–10.0)
Monocytes Absolute: 0.6 10*3/uL (ref 0.00–0.90)
Neutrophils %: 78.8 % — ABNORMAL HIGH (ref 50.0–65.0)
Neutrophils Absolute: 6.3 10*3/uL (ref 1.5–7.5)
Platelets: 272 10*3/uL (ref 130–400)
RBC: 4.85 M/uL (ref 4.20–5.40)
RDW: 13 % (ref 11.5–14.5)
WBC: 8 10*3/uL (ref 4.8–10.8)

## 2016-09-21 LAB — TYPE AND SCREEN
ABO/Rh: A POS
Antibody Screen: NEGATIVE

## 2016-09-21 LAB — URINALYSIS
Bilirubin Urine: NEGATIVE
Blood, Urine: NEGATIVE
Glucose, Ur: NEGATIVE mg/dL
Ketones, Urine: NEGATIVE mg/dL
Nitrite, Urine: NEGATIVE
Protein, UA: NEGATIVE mg/dL
Specific Gravity, UA: 1.02 (ref 1.005–1.030)
Urobilinogen, Urine: 0.2 E.U./dL (ref <2.0)
pH, UA: 6 (ref 5.0–8.0)

## 2016-09-21 LAB — MICROSCOPIC URINALYSIS
Bacteria, UA: NEGATIVE /HPF
Epithelial Cells, UA: 1 /HPF (ref 0–5)
Hyaline Casts, UA: 7 /HPF (ref 0–8)
RBC, UA: 3 /HPF (ref 0–4)
WBC, UA: 3 /HPF (ref 0–5)

## 2016-09-21 LAB — APTT: aPTT: 32.2 s (ref 26.0–36.2)

## 2016-09-21 LAB — PROTIME-INR
INR: 1.05 (ref 0.88–1.18)
Protime: 13.6 s (ref 12.0–14.6)

## 2016-09-21 LAB — LIPASE: Lipase: 19 U/L (ref 13–60)

## 2016-09-21 MED ORDER — MORPHINE SULFATE 4 MG/ML IJ SOLN
4 MG/ML | Freq: Once | INTRAMUSCULAR | Status: DC
Start: 2016-09-21 — End: 2016-09-21

## 2016-09-21 MED FILL — MORPHINE SULFATE 4 MG/ML IJ SOLN: 4 MG/ML | INTRAMUSCULAR | Qty: 1

## 2016-09-21 NOTE — ED Provider Notes (Signed)
[facility] EMERGENCY DEPT  eMERGENCY dEPARTMENT eNCOUnter      Pt Name: Veronica Skinner  MRN: [id number]  Birthdate 04/21/1951  Date of evaluation: 09/21/2016  Provider: Lyndee Hensenobert Kay Ricciuti, APRN    CHIEF COMPLAINT       Chief Complaint   Patient presents with   ??? Rectal Bleeding     started last Tuesday         HISTORY OF PRESENT ILLNESS   (Location/Symptom, Timing/Onset, Context/Setting, Quality, Duration, Modifying Factors, Severity)  Note limiting factors.   Veronica Skinner is a 65 y.o. female who presents to the emergency department for evaluation of rectal bleeding. Pt tells me that she was evaluated for abdominal pain 8 days ago by her doctor with CT. She tells me that she believes she had diverticulitis and felt better after cipro/flagyl. 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 She has had return of left sided abdomina pain today. She has had no fever, nausea or vomiting.   HPI    Nursing Notes were reviewed.    REVIEW OF SYSTEMS    (2-9 systems for level 4, 10 or more for level 5)     Review of Systems   Constitutional: Negative.    Respiratory: Negative.    Cardiovascular: Negative.    Gastrointestinal: Positive for abdominal pain and blood in stool.   Genitourinary: Negative.        A complete review of systems was performed and is negative except as noted above in the HPI.       PAST MEDICAL HISTORY     Past Medical History:   Diagnosis Date   ??? Diverticulosis    ??? Hypertension          SURGICAL HISTORY     History reviewed. No pertinent surgical history.      CURRENT MEDICATIONS       Discharge Medication List as of 09/21/2016  2:19 PM      CONTINUE these medications which have NOT CHANGED    Details   ciprofloxacin (CIPRO) 500 MG tablet Take 500 mg by mouth 2 times dailyHistorical Med      metroNIDAZOLE (FLAGYL) 500 MG tablet Take 500 mg by mouth 3 times dailyHistorical Med             ALLERGIES     Iv dye [iodides]    FAMILY HISTORY     History  reviewed. No pertinent family history.       SOCIAL HISTORY       Social History     Social History   ??? Marital status: Single     Spouse name: N/A   ??? Number of children: N/A   ??? Years of education: N/A     Social History Main Topics   ??? Smoking status: Never Smoker   ??? Smokeless tobacco: Never Used   ??? Alcohol use No   ??? Drug use: Unknown   ??? Sexual activity: Not Asked     Other Topics Concern   ??? None     Social History Narrative   ??? None       SCREENINGS             PHYSICAL EXAM    (up to 7 for level 4, 8 or more for level 5)     ED Triage Vitals   BP Temp Temp src Pulse Resp SpO2 Height Weight   -- -- -- -- -- -- -- --  Vitals:    09/21/16 1301 09/21/16 1331 09/21/16 1401 09/21/16 1430   BP: 112/72 119/78 138/80    Pulse:       Resp:       Temp:    97.9 ??F (36.6 ??C)   TempSrc:       SpO2: 94% 95% 99%    Weight:       Height:             Physical Exam   Constitutional: She is oriented to person, place, and time. She appears well-nourished.   HENT:   Head: Normocephalic.   Right Ear: External ear normal.   Left Ear: External ear normal.   Mouth/Throat: Oropharynx is clear and moist.   Eyes: Conjunctivae and EOM are normal. Pupils are equal, round, and reactive to light.   Neck: Normal range of motion.   Cardiovascular: Normal rate, regular rhythm, normal heart sounds and intact distal pulses.    Pulmonary/Chest: Effort normal and breath sounds normal.   Abdominal: Soft.   Genitourinary: Rectal exam shows guaiac positive stool.   Genitourinary Comments: Scant blood on exam glove   Musculoskeletal: Normal range of motion.   Neurological: She is alert and oriented to person, place, and time.   Skin: Skin is warm and dry.   Vitals reviewed.      DIAGNOSTIC RESULTS     EKG: All EKG's are interpreted by the Emergency Department Physician who either signs or Co-signs this chart in the absence of a cardiologist.        RADIOLOGY:   Non-plain film images such as CT, Ultrasound and MRI are read by the radiologist.  Plain radiographic images are visualized and preliminarily interpreted by the emergency physician with the below findings:        Interpretation per the Radiologist below, if available at the time of this note:    CT ABDOMEN PELVIS WO IV CONTRAST Additional Contrast? Oral   Final Result   Impression:   1. No acute findings in the abdomen or pelvis.   2. Mild pelvocaliectasis with normal caliber ureters. Chronic UPJ   stenoses not excluded. Asymmetric left renal cortical scarring with   multiple cysts.   3. Age-indeterminate fracture of L3.   Signed by Dr Elsie Stain on 09/21/2016 1:56 PM            ED BEDSIDE ULTRASOUND:   Performed by ED Physician - none    LABS:  Labs Reviewed   CBC WITH AUTO DIFFERENTIAL - Abnormal; Notable for the following:        Result Value    Neutrophils % 78.8 (*)     Lymphocytes % 11.6 (*)     Lymphocytes # 0.9 (*)     All other components within normal limits   COMPREHENSIVE METABOLIC PANEL - Abnormal; Notable for the following:     CO2 30 (*)     Glucose 115 (*)     AST 33 (*)     All other components within normal limits   URINALYSIS - Abnormal; Notable for the following:     Leukocyte Esterase, Urine SMALL (*)     All other components within normal limits   URINE CULTURE   LIPASE   APTT   PROTIME-INR   MICROSCOPIC URINALYSIS   TYPE AND SCREEN       All other labs were within normal range or not returned as of this dictation.    RE-ASSESSMENT     Pt denies further  episode of rectal bleeding after bowel movement during time of evaluation here. She remains in no distress and wants to go home.       EMERGENCY DEPARTMENT COURSE and DIFFERENTIAL DIAGNOSIS/MDM:   Vitals:    Vitals:    09/21/16 1301 09/21/16 1331 09/21/16 1401 09/21/16 1430   BP: 112/72 119/78 138/80    Pulse:       Resp:       Temp:    97.9 ??F (36.6 ??C)   TempSrc:       SpO2: 94% 95% 99%    Weight:       Height:           MDM      CONSULTS:  None    PROCEDURES:  Unless otherwise noted below, none     Procedures    FINAL  IMPRESSION      1. Lower GI bleed          DISPOSITION/PLAN   DISPOSITION     PATIENT REFERRED TO:  your gastroenterologist            DISCHARGE MEDICATIONS:       Discharge Medication List as of 09/21/2016  2:19 PM          (Please note that portions of this note were completed with a voice recognition program.  Efforts were made to edit the dictations but occasionally words are mis-transcribed.)              Lyndee Hensenobert Cristiana Yochim, APRN  09/21/16 1542

## 2016-09-21 NOTE — ED Triage Notes (Signed)
Pt states that she has diverticulosis. Pt was diagnosed last Wednesday. Bleeding started last Tuesday and is intermittent.

## 2016-09-21 NOTE — ED Notes (Signed)
PT refused morphine order at this time. States she is not in pain. NP aware.      Jarome Lamasody A Keldric Poyer, RN  09/21/16 [phone]

## 2016-09-22 ENCOUNTER — Telehealth: Payer: Self-pay | Admitting: *Deleted

## 2016-09-22 NOTE — Telephone Encounter (Signed)
I reviewed the chart.  She has been seeing Dr Christin Bach for her bowel issues.  He had put her on cipro and flagyl.  Per his last note, it appears she was getting better.  If she is still having diarrhea, she may need to be reevaluated.  Does she have an appt with GI?  He was referring her back.  Also, I would hold on immodium.    Also, make sure she is taking a probiotic daily.  Can also try kaopectate as directed.  Would hold on f/u CT scan, but does need f/u with urology and GI as he informed.

## 2016-09-22 NOTE — Telephone Encounter (Signed)
Pt called back and wanted to know if you need a follow up post CT scan. Please advise, thank you!

## 2016-09-22 NOTE — Telephone Encounter (Signed)
Is this ok?

## 2016-09-22 NOTE — Telephone Encounter (Signed)
She thinks that the flagyl is causing the diarrhea, she has been eating slowly, like soups. Diarrhea yesterday, didn't see blood, but when she wiped there was blood, came home from her job to get this resolved.  Wanted to know if she needed to be checked for C-diff?   GI referral- we were working on this.  She is willing to go to anyone/ anywhere.  [name]- appt on December 29th  She was told by Dr. Caryl Bis if she had blood drainage, she was to go to a facility asap.  She did that and a  CT scan was done yesterday and had blood work done yesterday.  It was a contrast one. BP was initially low and after scan it came up.  She rented a car and drove home.   She will bring the records by the office tomorrow and let me know how her symptoms are.    [name] at a facility yesterday.   Please see me with questions.

## 2016-09-22 NOTE — Telephone Encounter (Signed)
Pt requested a call for consult to take imodium for her diarrhea   Pt contact [phone]

## 2016-09-22 NOTE — Telephone Encounter (Signed)
Duplicate. See previous note.

## 2016-09-22 NOTE — Telephone Encounter (Signed)
Please advise 

## 2016-09-23 ENCOUNTER — Other Ambulatory Visit: Payer: Self-pay | Admitting: Internal Medicine

## 2016-09-23 DIAGNOSIS — R197 Diarrhea, unspecified: Secondary | ICD-10-CM

## 2016-09-23 LAB — CULTURE, URINE: Urine Culture, Routine: NO GROWTH

## 2016-09-23 NOTE — Telephone Encounter (Signed)
Spoke to [facility] Allison [name] is scheduled with the first available appointment of Jan 11. Dr. Nicki Reaper you may want to try to call to get her in soon. They said that they have her on a cancellation list but that isn't a guarantee.

## 2016-09-23 NOTE — Telephone Encounter (Signed)
Have her go by the lab and pick up kit to check for C. Diff.  I will place the order.  Will review her records.  Have her sign medical release form to obtain her records.

## 2016-09-23 NOTE — Telephone Encounter (Signed)
Please notify Allison [name] of situation and see if she can call and get her an appt.  If problems, let me know and I will call.  Regarding the tetanus, if she has not had a booster in 10 years, then she needs.  May be cheaper for her to get this at the pharmacy.  She can check with her insurance.

## 2016-09-23 NOTE — Telephone Encounter (Signed)
Melissa can you look into the referral thanks

## 2016-09-23 NOTE — Telephone Encounter (Signed)
Patient came by the office.  She picked up the CDiff kit and will complete it and return it Monday.  She is out of work for only one more day.  She wants to know if she can possibly get the referral to GI completed ASAP so that if she needs more testing she can activate her short term disability.  She needs to continue to get paid. I see that the referral went to [place] and she is willing to go anywhere due to get it done as soon.  She is concerned that she may need another colonoscopy and needs to make that happen sooner rather then later.  She signed a consent to obtain her records from [facility] in [address] , [place].  When she returns on Monday with the specimen she is wanting to get a tetanus immunization, please advise. thanks

## 2016-09-23 NOTE — Progress Notes (Signed)
Order placed for c.diff.   

## 2016-09-26 ENCOUNTER — Other Ambulatory Visit: Payer: BLUE CROSS/BLUE SHIELD

## 2016-09-26 ENCOUNTER — Telehealth: Payer: Self-pay | Admitting: Internal Medicine

## 2016-09-26 ENCOUNTER — Telehealth: Payer: Self-pay

## 2016-09-26 ENCOUNTER — Ambulatory Visit (INDEPENDENT_AMBULATORY_CARE_PROVIDER_SITE_OTHER): Payer: BLUE CROSS/BLUE SHIELD

## 2016-09-26 DIAGNOSIS — R197 Diarrhea, unspecified: Secondary | ICD-10-CM

## 2016-09-26 DIAGNOSIS — Z23 Encounter for immunization: Secondary | ICD-10-CM | POA: Diagnosis not present

## 2016-09-26 NOTE — Telephone Encounter (Signed)
Patient states her stool has started to firm up .  Inquiring on if she should apply for short term disability.  She wants your opinion on this please advise   She dropped of c-diff labs today.  Please advise.

## 2016-09-26 NOTE — Telephone Encounter (Signed)
Patient advised of below and verbalized an understanding  

## 2016-09-26 NOTE — Telephone Encounter (Signed)
I have called GI and they are going to call her with an earlier appt (they were going to try to work her in for earlier appt).  Will await c.dif studies.  Regarding the disability, she will need to talk to her HR person at her work.

## 2016-09-26 NOTE — Telephone Encounter (Signed)
error 

## 2016-09-26 NOTE — Progress Notes (Addendum)
Patient comes in for tetanus vaccine.  Injected left deltoid .  Patient tolerated injection well.     Reviewed above.   Dr Nicki Reaper

## 2016-09-26 NOTE — Telephone Encounter (Signed)
FYI - Pt called and stated that she got a call from [facility] and they had a cancellation and they are going to get her in for Weds. 12/6 @ 11 am.

## 2016-09-27 LAB — CLOSTRIDIUM DIFFICILE BY PCR: Toxigenic C. Difficile by PCR: NOT DETECTED

## 2016-09-28 ENCOUNTER — Telehealth: Payer: Self-pay | Admitting: *Deleted

## 2016-09-28 NOTE — Telephone Encounter (Signed)
Patient would like to add hyoscyamine (0.25 mg every 6 hours as needed) to her medication list. This medication was prescribed by Dr. Ellin Mayhew at [facility] for diarrhea

## 2016-09-29 NOTE — Telephone Encounter (Signed)
Added to chart

## 2016-10-21 ENCOUNTER — Ambulatory Visit: Payer: Self-pay

## 2016-10-27 ENCOUNTER — Other Ambulatory Visit: Payer: Self-pay | Admitting: Internal Medicine

## 2016-10-27 NOTE — Telephone Encounter (Signed)
Last filled alprazolam 08/01/16 30 0rf hydrochlorothiazide last filled 08/01/16 30 1rf

## 2016-10-28 NOTE — Telephone Encounter (Signed)
faxed

## 2016-11-01 ENCOUNTER — Ambulatory Visit: Payer: Self-pay

## 2016-11-29 ENCOUNTER — Other Ambulatory Visit: Payer: Self-pay | Admitting: Internal Medicine

## 2016-11-29 NOTE — Telephone Encounter (Signed)
error 

## 2016-11-30 NOTE — Telephone Encounter (Signed)
Pt last refill for Xanax was on 10/28/16. Pt last OV was on 07/15/16 for general medication examination. Ok to refill?

## 2016-12-08 ENCOUNTER — Other Ambulatory Visit: Payer: Self-pay | Admitting: Internal Medicine

## 2016-12-22 ENCOUNTER — Encounter: Payer: Self-pay | Admitting: *Deleted

## 2016-12-23 ENCOUNTER — Ambulatory Visit: Payer: BLUE CROSS/BLUE SHIELD | Admitting: Anesthesiology

## 2016-12-23 ENCOUNTER — Encounter: Payer: Self-pay | Admitting: *Deleted

## 2016-12-23 ENCOUNTER — Encounter
Admission: RE | Disposition: A | Discharge status: Home / Self Care | Payer: Self-pay | Source: Ambulatory Visit | Attending: Gastroenterology

## 2016-12-23 ENCOUNTER — Ambulatory Visit
Admission: RE | Admit: 2016-12-23 | Discharge: 2016-12-23 | Disposition: A | Discharge status: Home / Self Care | Payer: BLUE CROSS/BLUE SHIELD | Source: Ambulatory Visit | Attending: Gastroenterology | Admitting: Gastroenterology

## 2016-12-23 DIAGNOSIS — E7800 Pure hypercholesterolemia, unspecified: Secondary | ICD-10-CM | POA: Diagnosis not present

## 2016-12-23 DIAGNOSIS — Z79899 Other long term (current) drug therapy: Secondary | ICD-10-CM | POA: Insufficient documentation

## 2016-12-23 DIAGNOSIS — I10 Essential (primary) hypertension: Secondary | ICD-10-CM | POA: Insufficient documentation

## 2016-12-23 DIAGNOSIS — D123 Benign neoplasm of transverse colon: Secondary | ICD-10-CM | POA: Insufficient documentation

## 2016-12-23 DIAGNOSIS — Z8719 Personal history of other diseases of the digestive system: Secondary | ICD-10-CM | POA: Diagnosis present

## 2016-12-23 DIAGNOSIS — Z888 Allergy status to other drugs, medicaments and biological substances status: Secondary | ICD-10-CM | POA: Insufficient documentation

## 2016-12-23 DIAGNOSIS — G43909 Migraine, unspecified, not intractable, without status migrainosus: Secondary | ICD-10-CM | POA: Insufficient documentation

## 2016-12-23 DIAGNOSIS — Q438 Other specified congenital malformations of intestine: Secondary | ICD-10-CM | POA: Insufficient documentation

## 2016-12-23 DIAGNOSIS — Z791 Long term (current) use of non-steroidal anti-inflammatories (NSAID): Secondary | ICD-10-CM | POA: Diagnosis not present

## 2016-12-23 DIAGNOSIS — Z8371 Family history of colonic polyps: Secondary | ICD-10-CM | POA: Diagnosis not present

## 2016-12-23 DIAGNOSIS — K219 Gastro-esophageal reflux disease without esophagitis: Secondary | ICD-10-CM | POA: Insufficient documentation

## 2016-12-23 DIAGNOSIS — K5730 Diverticulosis of large intestine without perforation or abscess without bleeding: Secondary | ICD-10-CM | POA: Insufficient documentation

## 2016-12-23 DIAGNOSIS — K529 Noninfective gastroenteritis and colitis, unspecified: Secondary | ICD-10-CM | POA: Insufficient documentation

## 2016-12-23 DIAGNOSIS — K625 Hemorrhage of anus and rectum: Secondary | ICD-10-CM | POA: Diagnosis present

## 2016-12-23 HISTORY — PX: COLONOSCOPY WITH PROPOFOL: SHX5780

## 2016-12-23 SURGERY — COLONOSCOPY WITH PROPOFOL
Anesthesia: General

## 2016-12-23 MED ORDER — GLYCOPYRROLATE 0.2 MG/ML IJ SOLN
INTRAMUSCULAR | Status: AC
  Filled 2016-12-23: qty 1

## 2016-12-23 MED ORDER — LIDOCAINE HCL (CARDIAC) 20 MG/ML IV SOLN
INTRAVENOUS | Status: DC | PRN
  Administered 2016-12-23: 60 mg via INTRAVENOUS

## 2016-12-23 MED ORDER — SODIUM CHLORIDE 0.9 % IV SOLN: INTRAVENOUS | Status: DC

## 2016-12-23 MED ORDER — PROPOFOL 500 MG/50ML IV EMUL
INTRAVENOUS | Status: AC
  Filled 2016-12-23: qty 50

## 2016-12-23 MED ORDER — PROPOFOL 500 MG/50ML IV EMUL
INTRAVENOUS | Status: DC | PRN
  Administered 2016-12-23: 120 ug/kg/min via INTRAVENOUS

## 2016-12-23 MED ORDER — PROPOFOL 10 MG/ML IV BOLUS
INTRAVENOUS | Status: DC | PRN
  Administered 2016-12-23: 50 mg via INTRAVENOUS
  Administered 2016-12-23: 30 mg via INTRAVENOUS

## 2016-12-23 MED ORDER — SODIUM CHLORIDE 0.9 % IV SOLN
INTRAVENOUS | Status: DC
  Administered 2016-12-23: 13:00:00 via INTRAVENOUS

## 2016-12-23 MED ORDER — PHENYLEPHRINE HCL 10 MG/ML IJ SOLN
INTRAMUSCULAR | Status: DC | PRN
  Administered 2016-12-23: 100 ug via INTRAVENOUS

## 2016-12-23 MED ORDER — LIDOCAINE HCL (PF) 2 % IJ SOLN
INTRAMUSCULAR | Status: AC
  Filled 2016-12-23: qty 2

## 2016-12-23 NOTE — Transfer of Care (Signed)
Immediate Anesthesia Transfer of Care Note  Patient: Allison [name]  Procedure(s) Performed: Procedure(s): COLONOSCOPY WITH PROPOFOL (N/A)  Patient Location: PACU  Anesthesia Type:General  Level of Consciousness: sedated  Airway & Oxygen Therapy: Patient Spontanous Breathing and Patient connected to nasal cannula oxygen  Post-op Assessment: Report given to RN and Post -op Vital signs reviewed and stable  Post vital signs: Reviewed and stable  Last Vitals:  Vitals:   12/23/16 1233 12/23/16 1457  BP: 131/77 93/59  Pulse: 72 70  Resp: 20 16  Temp: 37.4 C (!) 35.6 C    Last Pain:  Vitals:   12/23/16 1457  TempSrc: Tympanic      Patients Stated Pain Goal: 0 ([phone])  Complications: No apparent anesthesia complications

## 2016-12-23 NOTE — Anesthesia Post-op Follow-up Note (Cosign Needed)
Anesthesia QCDR form completed.        

## 2016-12-23 NOTE — Anesthesia Preprocedure Evaluation (Signed)
Anesthesia Evaluation  Patient identified by MRN, date of birth, ID band Patient awake    Reviewed: Allergy & Precautions, NPO status , Patient's Chart, lab work & pertinent test results  Airway Mallampati: II       Dental  (+) Teeth Intact   Pulmonary neg pulmonary ROS,    breath sounds clear to auscultation       Cardiovascular Exercise Tolerance: Good hypertension, Pt. on medications  Rhythm:Regular     Neuro/Psych  Headaches,    GI/Hepatic Neg liver ROS, GERD  Medicated,  Endo/Other  negative endocrine ROS  Renal/GU negative Renal ROS     Musculoskeletal negative musculoskeletal ROS (+)   Abdominal Normal abdominal exam  (+)   Peds negative pediatric ROS (+)  Hematology negative hematology ROS (+)   Anesthesia Other Findings   Reproductive/Obstetrics                             Anesthesia Physical Anesthesia Plan  ASA: II  Anesthesia Plan: General   Post-op Pain Management:    Induction: Intravenous  Airway Management Planned: Natural Airway and Nasal Cannula  Additional Equipment:   Intra-op Plan:   Post-operative Plan:   Informed Consent: I have reviewed the patients History and Physical, chart, labs and discussed the procedure including the risks, benefits and alternatives for the proposed anesthesia with the patient or authorized representative who has indicated his/her understanding and acceptance.     Plan Discussed with: CRNA and Surgeon  Anesthesia Plan Comments:         Anesthesia Quick Evaluation

## 2016-12-23 NOTE — H&P (Signed)
Outpatient short stay form Pre-procedure 12/23/2016 2:02 PM Allison Sails MD  Primary Physician: Dr. Einar Pheasant  Reason for visit:  Colonoscopy  History of present illness:  Patient is a 66 year old female presenting today as above. She has had multiple recurrences of diverticulitis over the past several months. Her last treatment was over month ago. She has done well since also initiating use of probiotic. She has no personal history of colon polyps however there is a family history of colon polyps in primary relatives. She tolerated her prep well. She takes no aspirin or blood thinning agents.    Current Facility-Administered Medications:  .  0.9 %  sodium chloride infusion, , Intravenous, Continuous, Allison Sails, MD, Last Rate: 20 mL/hr at 12/23/16 1257 .  0.9 %  sodium chloride infusion, , Intravenous, Continuous, Allison Sails, MD  Prescriptions Prior to Admission  Medication Sig Dispense Refill Last Dose  . ALPRAZolam (XANAX) 0.25 MG tablet TAKE ONE TABLET BY MOUTH ONCE DAILY AS NEEDED 30 tablet 0 Past Week at Unknown time  . hydrochlorothiazide (MICROZIDE) 12.5 MG capsule TAKE ONE CAPSULE BY MOUTH ONCE DAILY 30 capsule 1 12/22/2016 at Unknown time  . hyoscyamine (LEVSIN, ANASPAZ) 0.125 MG tablet Take 0.125 mg by mouth every 4 (four) hours as needed.   Past Month at Unknown time  . omeprazole (PRILOSEC) 20 MG capsule Take 20 mg by mouth daily.   12/22/2016 at Unknown time  . pravastatin (PRAVACHOL) 10 MG tablet TAKE ONE TABLET BY MOUTH ONCE DAILY 30 tablet 10 12/22/2016 at Unknown time  . ciprofloxacin (CIPRO) 500 MG tablet Take 1 tablet (500 mg total) by mouth 2 (two) times daily. 14 tablet 0 09/29/2016  . cyclobenzaprine (FLEXERIL) 5 MG tablet Take 1 tablet (5 mg total) by mouth 3 (three) times daily as needed for muscle spasms. 10 tablet 0 09/24/2016  . eletriptan (RELPAX) 20 MG tablet Take 1 tablet (20 mg total) by mouth as needed. may repeat in 2 hours if necessary 10  tablet 0 12/24/2015  . gentamicin (GARAMYCIN) 0.3 % ophthalmic solution Place 2 drops into the left eye every 6 (six) hours. 5 mL 0 10/25/2013  . MELOXICAM PO Take by mouth.   Taking  . metroNIDAZOLE (FLAGYL) 500 MG tablet Take 1 tablet (500 mg total) by mouth 3 (three) times daily. 21 tablet 0 09/29/2016     Allergies  Allergen Reactions  . Iodine      Past Medical History:  Diagnosis Date  . GERD (gastroesophageal reflux disease)   . Hypercholesterolemia   . Hypertension   . Migraine headache   . Psoriasis     Review of systems:      Physical Exam    Heart and lungs: Regular rate and rhythm without rub or gallop, lungs are bilaterally clear.    HEENT: Normocephalic atraumatic eyes are anicteric    Other:     Pertinant exam for procedure: Soft nontender nondistended bowel sounds positive normoactive.    Planned proceedures: Colonoscopy and indicated procedures. I have discussed the risks benefits and complications of procedures to include not limited to bleeding, infection, perforation and the risk of sedation and the patient wishes to proceed.    Allison Sails, MD Gastroenterology 12/23/2016  2:02 PM

## 2016-12-23 NOTE — Op Note (Signed)
[facility] Gastroenterology Patient Name: Allison [name] Procedure Date: 12/23/2016 2:03 PM MRN: [id number] Account #: [id number] Date of Birth: 13-Dec-1950 Admit Type: Outpatient Age: 66 Room: [facility] ENDO ROOM 3 Gender: Female Note Status: Finalized Procedure:            Colonoscopy Indications:          Rectal bleeding, Follow-up of diverticulitis Providers:            Lollie Sails, MD Referring MD:         Einar Pheasant, MD (Referring MD) Medicines:            Monitored Anesthesia Care Complications:        No immediate complications. Procedure:            Pre-Anesthesia Assessment:                       - ASA Grade Assessment: II - A patient with mild                        systemic disease.                       After obtaining informed consent, the colonoscope was                        passed under direct vision. Throughout the procedure,                        the patient's blood pressure, pulse, and oxygen                        saturations were monitored continuously. The                        Colonoscope was introduced through the anus and                        advanced to the the cecum, identified by appendiceal                        orifice and ileocecal valve. The colonoscopy was                        performed with moderate difficulty due to a tortuous                        colon. Successful completion of the procedure was aided                        by changing the patient to a supine position, changing                        the patient to a prone position and using manual                        pressure. The patient tolerated the procedure well. The                        quality of the bowel preparation was good. Findings:      Multiple small-mouthed diverticula were found  in the sigmoid colon and       descending colon.      A 3 mm polyp was found in the proximal transverse colon. The polyp was       sessile. The polyp was  removed with a cold biopsy forceps. Resection and       retrieval were complete.      A patchy area of mildly erythematous mucosa was found at the anus and in       the distal rectum. This was biopsied with a cold forceps for histology.      Biopsies for histology were taken with a cold forceps from the right       colon and left colon for evaluation of microscopic colitis.      The colon (entire examined portion) was moderately tortuous.      Of note the left colon seemed overall to have a smaller lumen.      The digital rectal exam was normal. Impression:           - Diverticulosis in the sigmoid colon and in the                        descending colon.                       - One 3 mm polyp in the proximal transverse colon,                        removed with a cold biopsy forceps. Resected and                        retrieved.                       - Erythematous mucosa at the anus and in the distal                        rectum. Biopsied.                       - Tortuous colon.                       - Biopsies were taken with a cold forceps from the                        right colon and left colon for evaluation of                        microscopic colitis. Recommendation:       - Continue present medications.                       - Await pathology results.                       - Return to GI clinic in 3 weeks. Procedure Code(s):    --- Professional ---                       [phone], Colonoscopy, flexible; with biopsy, single or                        multiple  Diagnosis Code(s):    --- Professional ---                       D12.3, Benign neoplasm of transverse colon (hepatic                        flexure or splenic flexure)                       K62.89, Other specified diseases of anus and rectum                       K62.5, Hemorrhage of anus and rectum                       K57.32, Diverticulitis of large intestine without                        perforation or abscess  without bleeding                       K57.30, Diverticulosis of large intestine without                        perforation or abscess without bleeding                       Q43.8, Other specified congenital malformations of                        intestine CPT copyright 2016 American Medical Association. All rights reserved. The codes documented in this report are preliminary and upon coder review may  be revised to meet current compliance requirements. Lollie Sails, MD 12/23/2016 2:57:28 PM This report has been signed electronically. Number of Addenda: 0 Note Initiated On: 12/23/2016 2:03 PM Scope Withdrawal Time: 0 hours 12 minutes 18 seconds  Total Procedure Duration: 0 hours 41 minutes 19 seconds       [facility]

## 2016-12-26 ENCOUNTER — Encounter: Payer: Self-pay | Admitting: Gastroenterology

## 2016-12-26 NOTE — Anesthesia Postprocedure Evaluation (Signed)
Anesthesia Post Note  Patient: Allison [name]  Procedure(s) Performed: Procedure(s) (LRB): COLONOSCOPY WITH PROPOFOL (N/A)  Patient location during evaluation: PACU Anesthesia Type: General Level of consciousness: awake Pain management: pain level controlled Vital Signs Assessment: post-procedure vital signs reviewed and stable Respiratory status: nonlabored ventilation Cardiovascular status: stable Anesthetic complications: no     Last Vitals:  Vitals:   12/23/16 1457 12/23/16 1502  BP: (!) 85/61 (!) 93/59  Pulse: 70 70  Resp: 15 14  Temp: (!) 35.6 C     Last Pain:  Vitals:   12/23/16 1457  TempSrc: Tympanic                 VAN STAVEREN,Draco Malczewski

## 2016-12-27 LAB — SURGICAL PATHOLOGY

## 2017-01-12 ENCOUNTER — Ambulatory Visit: Payer: BLUE CROSS/BLUE SHIELD | Admitting: Internal Medicine

## 2017-01-16 ENCOUNTER — Other Ambulatory Visit: Payer: Self-pay | Admitting: Internal Medicine

## 2017-01-16 NOTE — Telephone Encounter (Signed)
Medication: Xanax 0.25  Directions: 1 po qd  Last given: 12-01-16 #30 Number refills: 0 Last o/v: 07-15-16 Follow up: 01-19-17 Labs: 07-15-16

## 2017-01-19 ENCOUNTER — Encounter: Payer: Self-pay | Admitting: Internal Medicine

## 2017-01-19 ENCOUNTER — Ambulatory Visit (INDEPENDENT_AMBULATORY_CARE_PROVIDER_SITE_OTHER): Payer: BLUE CROSS/BLUE SHIELD | Admitting: Internal Medicine

## 2017-01-19 VITALS — BP 100/58 | HR 75 | Temp 98.2°F | Resp 16 | Ht 68.0 in | Wt 156.8 lb

## 2017-01-19 DIAGNOSIS — I10 Essential (primary) hypertension: Secondary | ICD-10-CM

## 2017-01-19 DIAGNOSIS — E7800 Pure hypercholesterolemia, unspecified: Secondary | ICD-10-CM

## 2017-01-19 DIAGNOSIS — G43809 Other migraine, not intractable, without status migrainosus: Secondary | ICD-10-CM

## 2017-01-19 DIAGNOSIS — K5790 Diverticulosis of intestine, part unspecified, without perforation or abscess without bleeding: Secondary | ICD-10-CM

## 2017-01-19 DIAGNOSIS — K219 Gastro-esophageal reflux disease without esophagitis: Secondary | ICD-10-CM | POA: Diagnosis not present

## 2017-01-19 DIAGNOSIS — Z1239 Encounter for other screening for malignant neoplasm of breast: Secondary | ICD-10-CM

## 2017-01-19 NOTE — Progress Notes (Signed)
Pre-visit discussion using our clinic review tool. No additional management support is needed unless otherwise documented below in the visit note.  

## 2017-01-19 NOTE — Progress Notes (Signed)
Patient ID: Allison [name], female   DOB: 1951/05/18, 66 y.o.   MRN: [id number]   Subjective:    Patient ID: Allison [name], female    DOB: July 02, 1951, 66 y.o.   MRN: [id number]  HPI  Patient here for a scheduled follow up.  She has been seen recently for colitis.  Treated with cipro and flagyl.  Symptoms improved.  Is a truck driver.  Was in [place].  Had small amount of bleeding.  Had f/u CT.  Saw GI.  Had colonoscopy 12/23/16 - diverticulosis and one 33mm polyp transverse colon.  She has had no further bleeding or problems.  No abdominal pain.  Bowels moving.  No chest pain.  Breathing stable.  Has a good appetite.     Past Medical History:  Diagnosis Date  . GERD (gastroesophageal reflux disease)   . Hypercholesterolemia   . Hypertension   . Migraine headache   . Psoriasis    Past Surgical History:  Procedure Laterality Date  . COLONOSCOPY WITH PROPOFOL N/A 12/23/2016   Procedure: COLONOSCOPY WITH PROPOFOL;  Surgeon: Lollie Sails, MD;  Location: [facility] ENDOSCOPY;  Service: Endoscopy;  Laterality: N/A;  . DILATION AND CURETTAGE OF UTERUS     history of abnormal bleeding  . TUBAL LIGATION     Family History  Problem Relation Age of Onset  . Asthma Father   . Diabetes Mother   . CVA Mother   . Psoriasis Mother   . Breast cancer Maternal Grandmother   . Breast cancer Paternal Grandmother   . Colon cancer Maternal Uncle   . Colon cancer      nephew  . Diabetes      multiple relatives (both sides)   Social History   Social History  . Marital status: Single    Spouse name: N/A  . Number of children: 1  . Years of education: N/A   Social History Main Topics  . Smoking status: Never Smoker  . Smokeless tobacco: Never Used  . Alcohol use No  . Drug use: No  . Sexual activity: Not Asked   Other Topics Concern  . None   Social History Narrative  . None    Outpatient Encounter Prescriptions as of 01/19/2017  Medication Sig  . ALPRAZolam (XANAX)  0.25 MG tablet TAKE 1 TABLET BY MOUTH ONCE DAILY AS NEEDED  . cyclobenzaprine (FLEXERIL) 5 MG tablet Take 1 tablet (5 mg total) by mouth 3 (three) times daily as needed for muscle spasms.  Marland Kitchen eletriptan (RELPAX) 20 MG tablet Take 1 tablet (20 mg total) by mouth as needed. may repeat in 2 hours if necessary  . gentamicin (GARAMYCIN) 0.3 % ophthalmic solution Place 2 drops into the left eye every 6 (six) hours.  . hydrochlorothiazide (MICROZIDE) 12.5 MG capsule TAKE ONE CAPSULE BY MOUTH ONCE DAILY  . hyoscyamine (LEVSIN, ANASPAZ) 0.125 MG tablet Take 0.125 mg by mouth every 4 (four) hours as needed.  . MELOXICAM PO Take by mouth.  . metroNIDAZOLE (FLAGYL) 500 MG tablet Take 1 tablet (500 mg total) by mouth 3 (three) times daily.  Marland Kitchen omeprazole (PRILOSEC) 20 MG capsule Take 20 mg by mouth daily.  . ciprofloxacin (CIPRO) 500 MG tablet Take by mouth.  . pravastatin (PRAVACHOL) 10 MG tablet TAKE ONE TABLET BY MOUTH ONCE DAILY  . [DISCONTINUED] ciprofloxacin (CIPRO) 500 MG tablet Take 1 tablet (500 mg total) by mouth 2 (two) times daily. (Patient not taking: Reported on 01/19/2017)   No facility-administered encounter medications on  file as of 01/19/2017.     Review of Systems  Constitutional: Negative for appetite change.       Is eating well.  Will follow weight.   HENT: Negative for congestion and sinus pressure.   Respiratory: Negative for cough, chest tightness and shortness of breath.   Cardiovascular: Negative for chest pain, palpitations and leg swelling.  Gastrointestinal: Negative for abdominal pain, diarrhea, nausea and vomiting.  Genitourinary: Negative for difficulty urinating and dysuria.  Musculoskeletal: Negative for back pain and joint swelling.  Skin: Negative for color change and rash.  Neurological: Negative for dizziness, light-headedness and headaches.  Psychiatric/Behavioral: Negative for agitation and dysphoric mood.       Objective:     Blood pressure rechecked by me:   128/82  Physical Exam  Constitutional: She appears well-developed and well-nourished. No distress.  HENT:  Nose: Nose normal.  Mouth/Throat: Oropharynx is clear and moist.  Neck: Neck supple. No thyromegaly present.  Cardiovascular: Normal rate and regular rhythm.   Pulmonary/Chest: Breath sounds normal. No respiratory distress. She has no wheezes.  Abdominal: Soft. Bowel sounds are normal. There is no tenderness.  Musculoskeletal: She exhibits no edema or tenderness.  Lymphadenopathy:    She has no cervical adenopathy.  Skin: No rash noted. No erythema.  Psychiatric: She has a normal mood and affect. Her behavior is normal.    BP (!) 100/58 (BP Location: Left Arm, Patient Position: Sitting, Cuff Size: Normal)   Pulse 75   Temp 98.2 F (36.8 C) (Oral)   Resp 16   Ht 5\' 8"  (1.727 m)   Wt 156 lb 12.8 oz (71.1 kg)   SpO2 99%   BMI 23.84 kg/m  Wt Readings from Last 3 Encounters:  01/19/17 156 lb 12.8 oz (71.1 kg)  12/23/16 163 lb (73.9 kg)  09/19/16 164 lb 6.4 oz (74.6 kg)     Lab Results  Component Value Date   WBC 6.3 09/14/2016   HGB 14.6 09/14/2016   HCT 43.0 09/14/2016   PLT 254.0 09/14/2016   GLUCOSE 102 (H) 09/14/2016   CHOL 184 07/15/2016   TRIG 57.0 07/15/2016   HDL 61.80 07/15/2016   LDLCALC 111 (H) 07/15/2016   ALT 12 09/14/2016   AST 20 09/14/2016   NA 141 09/14/2016   K 4.0 09/14/2016   CL 103 09/14/2016   CREATININE 0.90 09/14/2016   BUN 12 09/14/2016   CO2 32 09/14/2016   TSH 2.75 01/25/2016       Assessment & Plan:   Problem List Items Addressed This Visit    Diverticulosis    Recent colonoscopy as outlined.  No abdominal pain now.  No bleeding.  Follow.        GERD (gastroesophageal reflux disease)    Controlled on omeprazole.        Hypercholesterolemia    On pravastatin.  Follow lipid panel and liver function tests.        Relevant Orders   Hepatic function panel   Lipid panel   Hypertension    Blood pressure under good  control.  Continue same medication regimen.  Follow pressures.  Follow metabolic panel.        Relevant Orders   TSH   Basic metabolic panel   Migraine headache    Not an issue now.  Has not needed relpax.  Follow.         Other Visit Diagnoses    Screening for breast cancer    -  Primary  Relevant Orders   MM DIGITAL SCREENING BILATERAL       Einar Pheasant, MD

## 2017-01-20 ENCOUNTER — Ambulatory Visit: Payer: BLUE CROSS/BLUE SHIELD | Admitting: Internal Medicine

## 2017-01-22 ENCOUNTER — Encounter: Payer: Self-pay | Admitting: Internal Medicine

## 2017-01-22 NOTE — Assessment & Plan Note (Signed)
On pravastatin.  Follow lipid panel and liver function tests.   

## 2017-01-22 NOTE — Assessment & Plan Note (Signed)
Blood pressure under good control.  Continue same medication regimen.  Follow pressures.  Follow metabolic panel.   

## 2017-01-22 NOTE — Assessment & Plan Note (Signed)
Controlled on omeprazole.   

## 2017-01-22 NOTE — Assessment & Plan Note (Signed)
Recent colonoscopy as outlined.  No abdominal pain now.  No bleeding.  Follow.

## 2017-01-22 NOTE — Assessment & Plan Note (Signed)
Not an issue now.  Has not needed relpax.  Follow.

## 2017-01-23 ENCOUNTER — Telehealth: Payer: Self-pay | Admitting: Internal Medicine

## 2017-01-23 NOTE — Telephone Encounter (Signed)
Called patient informed that I have not been able to get in touch with them yet today.

## 2017-01-23 NOTE — Telephone Encounter (Signed)
Pt called and wanted to follow up on whether or not you were able to schedule her an appt for her Mammogram. Please advise, thank you!  Call pt @ [phone]

## 2017-01-23 NOTE — Telephone Encounter (Signed)
App has been been made for 07-24-17 arrive at 9am patient informed

## 2017-02-02 ENCOUNTER — Other Ambulatory Visit (INDEPENDENT_AMBULATORY_CARE_PROVIDER_SITE_OTHER): Payer: BLUE CROSS/BLUE SHIELD

## 2017-02-02 DIAGNOSIS — I10 Essential (primary) hypertension: Secondary | ICD-10-CM | POA: Diagnosis not present

## 2017-02-02 DIAGNOSIS — E7800 Pure hypercholesterolemia, unspecified: Secondary | ICD-10-CM

## 2017-02-02 LAB — BASIC METABOLIC PANEL
BUN: 14 mg/dL (ref 6–23)
CO2: 33 mEq/L — ABNORMAL HIGH (ref 19–32)
Calcium: 9.5 mg/dL (ref 8.4–10.5)
Chloride: 102 mEq/L (ref 96–112)
Creatinine, Ser: 0.76 mg/dL (ref 0.40–1.20)
GFR: 81.32 mL/min (ref >60.00)
Glucose, Bld: 83 mg/dL (ref 70–99)
Potassium: 4.4 mEq/L (ref 3.5–5.1)
Sodium: 142 mEq/L (ref 135–145)

## 2017-02-02 LAB — LIPID PANEL
Cholesterol: 217 mg/dL — ABNORMAL HIGH (ref 0–200)
HDL: 67.6 mg/dL (ref >39.00)
LDL Cholesterol: 140 mg/dL — ABNORMAL HIGH (ref 0–99)
NonHDL: 149.52
Total CHOL/HDL Ratio: 3
Triglycerides: 46 mg/dL (ref 0.0–149.0)
VLDL: 9.2 mg/dL (ref 0.0–40.0)

## 2017-02-02 LAB — HEPATIC FUNCTION PANEL
ALT: 17 U/L (ref 0–35)
AST: 19 U/L (ref 0–37)
Albumin: 4.3 g/dL (ref 3.5–5.2)
Alkaline Phosphatase: 61 U/L (ref 39–117)
Bilirubin, Direct: 0.1 mg/dL (ref 0.0–0.3)
Total Bilirubin: 0.6 mg/dL (ref 0.2–1.2)
Total Protein: 6.8 g/dL (ref 6.0–8.3)

## 2017-02-02 LAB — TSH: TSH: 1.12 u[IU]/mL (ref 0.35–4.50)

## 2017-02-23 ENCOUNTER — Other Ambulatory Visit: Payer: Self-pay | Admitting: Internal Medicine

## 2017-02-23 NOTE — Telephone Encounter (Signed)
Last OV in February, last refill was in march, please advise, thanks

## 2017-03-23 ENCOUNTER — Other Ambulatory Visit: Payer: Self-pay | Admitting: Internal Medicine

## 2017-05-07 ENCOUNTER — Other Ambulatory Visit: Payer: Self-pay | Admitting: Internal Medicine

## 2017-05-08 ENCOUNTER — Telehealth: Payer: Self-pay | Admitting: Internal Medicine

## 2017-05-08 NOTE — Telephone Encounter (Signed)
error 

## 2017-06-19 ENCOUNTER — Other Ambulatory Visit: Payer: Self-pay | Admitting: Internal Medicine

## 2017-06-19 NOTE — Telephone Encounter (Signed)
Last OV 01/19/2017 Next OV 07/21/2017 Last refill 03/24/2017

## 2017-06-20 ENCOUNTER — Other Ambulatory Visit: Payer: Self-pay | Admitting: Internal Medicine

## 2017-06-20 NOTE — Telephone Encounter (Deleted)
Error

## 2017-06-20 NOTE — Telephone Encounter (Signed)
Scrip faxed

## 2017-07-21 ENCOUNTER — Ambulatory Visit (INDEPENDENT_AMBULATORY_CARE_PROVIDER_SITE_OTHER): Payer: BLUE CROSS/BLUE SHIELD | Admitting: Internal Medicine

## 2017-07-21 ENCOUNTER — Encounter: Payer: Self-pay | Admitting: Internal Medicine

## 2017-07-21 VITALS — BP 110/62 | HR 69 | Temp 98.6°F | Resp 12 | Ht 68.0 in | Wt 151.8 lb

## 2017-07-21 DIAGNOSIS — E7800 Pure hypercholesterolemia, unspecified: Secondary | ICD-10-CM | POA: Diagnosis not present

## 2017-07-21 DIAGNOSIS — Z0001 Encounter for general adult medical examination with abnormal findings: Secondary | ICD-10-CM | POA: Diagnosis not present

## 2017-07-21 DIAGNOSIS — Z0000 Encounter for general adult medical examination without abnormal findings: Secondary | ICD-10-CM

## 2017-07-21 DIAGNOSIS — Z23 Encounter for immunization: Secondary | ICD-10-CM | POA: Diagnosis not present

## 2017-07-21 DIAGNOSIS — I10 Essential (primary) hypertension: Secondary | ICD-10-CM

## 2017-07-21 DIAGNOSIS — R195 Other fecal abnormalities: Secondary | ICD-10-CM | POA: Diagnosis not present

## 2017-07-21 DIAGNOSIS — R634 Abnormal weight loss: Secondary | ICD-10-CM | POA: Diagnosis not present

## 2017-07-21 DIAGNOSIS — K219 Gastro-esophageal reflux disease without esophagitis: Secondary | ICD-10-CM | POA: Diagnosis not present

## 2017-07-21 LAB — HEPATIC FUNCTION PANEL
ALT: 15 U/L (ref 0–35)
AST: 19 U/L (ref 0–37)
Albumin: 4.2 g/dL (ref 3.5–5.2)
Alkaline Phosphatase: 55 U/L (ref 39–117)
Bilirubin, Direct: 0.1 mg/dL (ref 0.0–0.3)
Total Bilirubin: 0.3 mg/dL (ref 0.2–1.2)
Total Protein: 6.9 g/dL (ref 6.0–8.3)

## 2017-07-21 LAB — CBC WITH DIFFERENTIAL/PLATELET
Basophils Absolute: 0 10*3/uL (ref 0.0–0.1)
Basophils Relative: 0.7 % (ref 0.0–3.0)
Eosinophils Absolute: 0.2 10*3/uL (ref 0.0–0.7)
Eosinophils Relative: 2.6 % (ref 0.0–5.0)
HCT: 44.5 % (ref 36.0–46.0)
Hemoglobin: 14.7 g/dL (ref 12.0–15.0)
Lymphocytes Relative: 25.6 % (ref 12.0–46.0)
Lymphs Abs: 1.8 10*3/uL (ref 0.7–4.0)
MCHC: 33.1 g/dL (ref 30.0–36.0)
MCV: 93.6 fl (ref 78.0–100.0)
Monocytes Absolute: 0.6 10*3/uL (ref 0.1–1.0)
Monocytes Relative: 8.9 % (ref 3.0–12.0)
Neutro Abs: 4.4 10*3/uL (ref 1.4–7.7)
Neutrophils Relative %: 62.2 % (ref 43.0–77.0)
Platelets: 261 10*3/uL (ref 150.0–400.0)
RBC: 4.75 Mil/uL (ref 3.87–5.11)
RDW: 13.2 % (ref 11.5–15.5)
WBC: 7.1 10*3/uL (ref 4.0–10.5)

## 2017-07-21 LAB — BASIC METABOLIC PANEL
BUN: 17 mg/dL (ref 6–23)
CO2: 33 mEq/L — ABNORMAL HIGH (ref 19–32)
Calcium: 9.5 mg/dL (ref 8.4–10.5)
Chloride: 99 mEq/L (ref 96–112)
Creatinine, Ser: 0.64 mg/dL (ref 0.40–1.20)
GFR: 99.02 mL/min (ref >60.00)
Glucose, Bld: 105 mg/dL — ABNORMAL HIGH (ref 70–99)
Potassium: 4.1 mEq/L (ref 3.5–5.1)
Sodium: 138 mEq/L (ref 135–145)

## 2017-07-21 LAB — TSH: TSH: 1.1 u[IU]/mL (ref 0.35–4.50)

## 2017-07-21 LAB — LIPID PANEL
Cholesterol: 188 mg/dL (ref 0–200)
HDL: 57.3 mg/dL (ref >39.00)
LDL Cholesterol: 112 mg/dL — ABNORMAL HIGH (ref 0–99)
NonHDL: 130.69
Total CHOL/HDL Ratio: 3
Triglycerides: 95 mg/dL (ref 0.0–149.0)
VLDL: 19 mg/dL (ref 0.0–40.0)

## 2017-07-21 MED ORDER — CIPROFLOXACIN HCL 500 MG PO TABS: 500.0000 mg | ORAL_TABLET | Freq: Two times a day (BID) | ORAL | 0 refills | Status: DC

## 2017-07-21 NOTE — Progress Notes (Signed)
Patient ID: Allison [name], female   DOB: 1951-01-27, 66 y.o.   MRN: [id number]   Subjective:    Patient ID: Allison [name], female    DOB: 1951-09-22, 66 y.o.   MRN: [id number]  HPI  Patient here for her physical exam.  She reports she is doing relatively well.  Has lost weight.  States she does try to watch what she eats.  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  Still having intermittent episodes of abdominal discomfort and loose stool.  No increased acid reflux.  Takes occasional Copywriter, advertising.  Is better now, but intermittent flares.  Eating.  No nausea or vomiting.     Past Medical History:  Diagnosis Date  . GERD (gastroesophageal reflux disease)   . Hypercholesterolemia   . Hypertension   . Migraine headache   . Psoriasis    Past Surgical History:  Procedure Laterality Date  . COLONOSCOPY WITH PROPOFOL N/A 12/23/2016   Procedure: COLONOSCOPY WITH PROPOFOL;  Surgeon: Lollie Sails, MD;  Location: [facility] ENDOSCOPY;  Service: Endoscopy;  Laterality: N/A;  . DILATION AND CURETTAGE OF UTERUS     history of abnormal bleeding  . TUBAL LIGATION     Family History  Problem Relation Age of Onset  . Asthma Father   . Diabetes Mother   . CVA Mother   . Psoriasis Mother   . Breast cancer Maternal Grandmother   . Breast cancer Paternal Grandmother   . Colon cancer Maternal Uncle   . Colon cancer Unknown        nephew  . Diabetes Unknown        multiple relatives (both sides)   Social History   Social History  . Marital status: Single    Spouse name: N/A  . Number of children: 1  . Years of education: N/A   Social History Main Topics  . Smoking status: Never Smoker  . Smokeless tobacco: Never Used  . Alcohol use No  . Drug use: No  . Sexual activity: Not Asked   Other Topics Concern  . None   Social History Narrative  . None    Outpatient Encounter Prescriptions as of 07/21/2017  Medication Sig  . ALPRAZolam (XANAX) 0.25 MG  tablet TAKE 1 TABLET BY MOUTH ONCE DAILY AS NEEDED  . eletriptan (RELPAX) 20 MG tablet Take 1 tablet (20 mg total) by mouth as needed. may repeat in 2 hours if necessary  . hydrochlorothiazide (MICROZIDE) 12.5 MG capsule TAKE 1 CAPSULE BY MOUTH ONCE DAILY  . hyoscyamine (LEVSIN, ANASPAZ) 0.125 MG tablet Take 0.125 mg by mouth every 4 (four) hours as needed.  . MELOXICAM PO Take by mouth.  Marland Kitchen omeprazole (PRILOSEC) 20 MG capsule Take 20 mg by mouth daily.  . pravastatin (PRAVACHOL) 10 MG tablet TAKE ONE TABLET BY MOUTH ONCE DAILY  . [DISCONTINUED] ciprofloxacin (CIPRO) 500 MG tablet Take by mouth.  . [DISCONTINUED] cyclobenzaprine (FLEXERIL) 5 MG tablet Take 1 tablet (5 mg total) by mouth 3 (three) times daily as needed for muscle spasms.  . [DISCONTINUED] gentamicin (GARAMYCIN) 0.3 % ophthalmic solution Place 2 drops into the left eye every 6 (six) hours.  . [DISCONTINUED] metroNIDAZOLE (FLAGYL) 500 MG tablet Take 1 tablet (500 mg total) by mouth 3 (three) times daily.  . ciprofloxacin (CIPRO) 500 MG tablet Take 1 tablet (500 mg total) by mouth 2 (two) times daily.   No facility-administered encounter medications on file as of 07/21/2017.  Review of Systems  Constitutional: Negative for appetite change.       Weight loss as outlined.    HENT: Negative for congestion and sinus pressure.   Eyes: Negative for pain and visual disturbance.  Respiratory: Negative for cough, chest tightness and shortness of breath.   Cardiovascular: Negative for chest pain, palpitations and leg swelling.  Gastrointestinal: Negative for abdominal pain, nausea and vomiting.       Intermittent loose stool as outlined.    Genitourinary: Negative for difficulty urinating and dysuria.  Musculoskeletal: Negative for back pain and joint swelling.  Skin: Negative for color change and rash.  Neurological: Negative for dizziness, light-headedness and headaches.  Hematological: Negative for adenopathy. Does not  bruise/bleed easily.  Psychiatric/Behavioral: Negative for agitation and dysphoric mood.       Objective:    Physical Exam  Constitutional: She is oriented to person, place, and time. She appears well-developed and well-nourished. No distress.  HENT:  Nose: Nose normal.  Mouth/Throat: Oropharynx is clear and moist.  Eyes: Right eye exhibits no discharge. Left eye exhibits no discharge. No scleral icterus.  Neck: Neck supple. No thyromegaly present.  Cardiovascular: Normal rate and regular rhythm.   Pulmonary/Chest: Breath sounds normal. No accessory muscle usage. No tachypnea. No respiratory distress. She has no decreased breath sounds. She has no wheezes. She has no rhonchi. Right breast exhibits no inverted nipple, no mass, no nipple discharge and no tenderness (no axillary adenopathy). Left breast exhibits no inverted nipple, no mass, no nipple discharge and no tenderness (no axilarry adenopathy).  Abdominal: Soft. Bowel sounds are normal. There is no tenderness.  Musculoskeletal: She exhibits no edema or tenderness.  Lymphadenopathy:    She has no cervical adenopathy.  Neurological: She is alert and oriented to person, place, and time.  Skin: Skin is warm. No rash noted. No erythema.  Psychiatric: She has a normal mood and affect. Her behavior is normal.    BP 110/62 (BP Location: Left Arm, Patient Position: Sitting, Cuff Size: Normal)   Pulse 69   Temp 98.6 F (37 C) (Oral)   Resp 12   Ht 5\' 8"  (1.727 m)   Wt 151 lb 12.8 oz (68.9 kg)   SpO2 97%   BMI 23.08 kg/m  Wt Readings from Last 3 Encounters:  07/21/17 151 lb 12.8 oz (68.9 kg)  01/19/17 156 lb 12.8 oz (71.1 kg)  12/23/16 163 lb (73.9 kg)     Lab Results  Component Value Date   WBC 7.1 07/21/2017   HGB 14.7 07/21/2017   HCT 44.5 07/21/2017   PLT 261.0 07/21/2017   GLUCOSE 105 (H) 07/21/2017   CHOL 188 07/21/2017   TRIG 95.0 07/21/2017   HDL 57.30 07/21/2017   LDLCALC 112 (H) 07/21/2017   ALT 15 07/21/2017    AST 19 07/21/2017   NA 138 07/21/2017   K 4.1 07/21/2017   CL 99 07/21/2017   CREATININE 0.64 07/21/2017   BUN 17 07/21/2017   CO2 33 (H) 07/21/2017   TSH 1.10 07/21/2017       Assessment & Plan:   Problem List Items Addressed This Visit    GERD (gastroesophageal reflux disease)    Controlled on omeprazole.        Relevant Orders   Ambulatory referral to Gastroenterology   Health care maintenance    Physical today 07/21/17.  PAP 07/13/15 - negative with negative HPV.  Colonoscopy 09/2014 - recommended f/u colonoscopy in five years.  Hypercholesterolemia    On pravastatin.  Low cholesterol diet and exercise.  Follow lipid panel and liver function tests.        Relevant Orders   Hepatic function panel (Completed)   Lipid panel (Completed)   Hypertension    Blood pressure under good control.  Continue same medication regimen.  Follow pressures.  Follow metabolic panel.        Relevant Orders   CBC with Differential/Platelet (Completed)   TSH (Completed)   Basic metabolic panel (Completed)   Loose stools    Persistent intermittent loose stool and abdominal discomfort.  Weight loss.  She is eating.  No vomiting.  Will refer back to GI for further evaluation. Pt in agreement.        Relevant Orders   Ambulatory referral to Gastroenterology   Weight loss    Weight loss as outlined.  Check labs.  Refer to GI.  F/u weight check.        Other Visit Diagnoses    Routine general medical examination at a health care facility    -  Primary   Need for immunization against influenza       Relevant Orders   Flu Vaccine QUAD 36+ mos IM (Completed)       Einar Pheasant, MD

## 2017-07-21 NOTE — Assessment & Plan Note (Signed)
Physical today 07/21/17.  PAP 07/13/15 - negative with negative HPV.  Colonoscopy 09/2014 - recommended f/u colonoscopy in five years.

## 2017-07-23 ENCOUNTER — Encounter: Payer: Self-pay | Admitting: Internal Medicine

## 2017-07-23 DIAGNOSIS — R195 Other fecal abnormalities: Secondary | ICD-10-CM | POA: Insufficient documentation

## 2017-07-23 DIAGNOSIS — R634 Abnormal weight loss: Secondary | ICD-10-CM | POA: Insufficient documentation

## 2017-07-23 NOTE — Assessment & Plan Note (Signed)
Persistent intermittent loose stool and abdominal discomfort.  Weight loss.  She is eating.  No vomiting.  Will refer back to GI for further evaluation. Pt in agreement.

## 2017-07-23 NOTE — Assessment & Plan Note (Signed)
Blood pressure under good control.  Continue same medication regimen.  Follow pressures.  Follow metabolic panel.   

## 2017-07-23 NOTE — Assessment & Plan Note (Signed)
Controlled on omeprazole.   

## 2017-07-23 NOTE — Assessment & Plan Note (Signed)
Weight loss as outlined.  Check labs.  Refer to GI.  F/u weight check.

## 2017-07-23 NOTE — Assessment & Plan Note (Signed)
On pravastatin.  Low cholesterol diet and exercise.  Follow lipid panel and liver function tests.   

## 2017-07-24 ENCOUNTER — Ambulatory Visit
Admission: RE | Admit: 2017-07-24 | Discharge: 2017-07-24 | Disposition: A | Discharge status: Home / Self Care | Payer: BLUE CROSS/BLUE SHIELD | Source: Ambulatory Visit | Attending: Internal Medicine | Admitting: Internal Medicine

## 2017-07-24 ENCOUNTER — Telehealth: Payer: Self-pay | Admitting: *Deleted

## 2017-07-24 DIAGNOSIS — Z1239 Encounter for other screening for malignant neoplasm of breast: Secondary | ICD-10-CM

## 2017-07-24 DIAGNOSIS — Z1231 Encounter for screening mammogram for malignant neoplasm of breast: Secondary | ICD-10-CM | POA: Insufficient documentation

## 2017-07-24 NOTE — Telephone Encounter (Signed)
Called patient l/m to call office see lab results for more documentation.

## 2017-07-24 NOTE — Telephone Encounter (Signed)
Patient will return your call in the morning.

## 2017-07-24 NOTE — Telephone Encounter (Signed)
Pt requested lab results Pt contact [phone]

## 2017-07-25 NOTE — Telephone Encounter (Signed)
Called patient informed lab results no questions at this time.

## 2017-08-12 ENCOUNTER — Other Ambulatory Visit: Payer: Self-pay | Admitting: Internal Medicine

## 2017-08-30 ENCOUNTER — Other Ambulatory Visit: Payer: Self-pay | Admitting: Internal Medicine

## 2017-09-15 ENCOUNTER — Other Ambulatory Visit: Payer: Self-pay | Admitting: Internal Medicine

## 2017-09-24 ENCOUNTER — Other Ambulatory Visit: Payer: Self-pay | Admitting: Internal Medicine

## 2017-11-11 ENCOUNTER — Other Ambulatory Visit: Payer: Self-pay | Admitting: Internal Medicine

## 2017-11-13 NOTE — Telephone Encounter (Signed)
Okay to refill? Last written on: 09/26/17 for #30 with no refills.  LOV: 07/21/17 NOV: 01/22/18

## 2017-11-14 NOTE — Telephone Encounter (Signed)
Rx faxed to [facility].

## 2017-11-30 ENCOUNTER — Telehealth: Payer: Self-pay

## 2017-11-30 DIAGNOSIS — M25512 Pain in left shoulder: Secondary | ICD-10-CM

## 2017-11-30 NOTE — Telephone Encounter (Signed)
If having persistent pain and wanting xrays/second opinion, she will need to be seen.  I do not mind seeing her, but if she has been seeing ortho and wants a second opinion, I can get her scheduled to see a different orthopedist.  This may save her two appts.

## 2017-11-30 NOTE — Telephone Encounter (Signed)
Copied from [place]. Topic: Quick Communication - See Telephone Encounter >> Nov 30, 2017  3:17 PM Oneta Rack wrote: CRM for notification. See Telephone encounter for:   11/30/17.  Relation to pt: self  Call back number:[phone]   Reason for call:  Patient requesting imaging orders for left arm / shoulder pain. Patient states she has been seeing Dr. Sabra Heck orthopedic but would like a second opinion, patient states PCP is aware of ongoing her concerns. Patient also mentioned she would like an additional order placed for her right hand ring finger which she hit on her truck, please advise  >> Nov 30, 2017  3:33 PM Oneta Rack wrote: CRM for notification. See Telephone encounter for:   11/30/17.  Relation to pt: self  Call back number:[phone]   Reason for call:  Patient requesting imaging orders for left arm / shoulder pain. Patient states she has been seeing Dr. Sabra Heck orthopedic but would like a second opinion, patient states PCP is aware of ongoing her concerns. Patient also mentioned she would like an additional order placed for her right hand ring finger which she hit on her truck, please advise

## 2017-11-30 NOTE — Telephone Encounter (Signed)
Please advise 

## 2017-12-01 NOTE — Telephone Encounter (Signed)
Pt says she is okay with a referral for a second opinion. Okay to leave referral appt on home voicemail. She will be in town on March 6-9th.

## 2017-12-03 NOTE — Telephone Encounter (Signed)
Order placed for ortho referral.   

## 2018-01-09 ENCOUNTER — Encounter: Payer: Self-pay | Admitting: Internal Medicine

## 2018-01-09 ENCOUNTER — Ambulatory Visit (INDEPENDENT_AMBULATORY_CARE_PROVIDER_SITE_OTHER): Payer: BLUE CROSS/BLUE SHIELD | Admitting: Internal Medicine

## 2018-01-09 VITALS — BP 116/78 | HR 70 | Temp 98.2°F | Wt 144.0 lb

## 2018-01-09 DIAGNOSIS — R05 Cough: Secondary | ICD-10-CM

## 2018-01-09 DIAGNOSIS — J069 Acute upper respiratory infection, unspecified: Secondary | ICD-10-CM

## 2018-01-09 DIAGNOSIS — B9789 Other viral agents as the cause of diseases classified elsewhere: Secondary | ICD-10-CM | POA: Diagnosis not present

## 2018-01-09 MED ORDER — METHYLPREDNISOLONE ACETATE 80 MG/ML IJ SUSP
80.0000 mg | Freq: Once | INTRAMUSCULAR | Status: AC
  Administered 2018-01-09: 80 mg via INTRAMUSCULAR

## 2018-01-09 NOTE — Progress Notes (Signed)
HPI  Pt presents to the clinic today with c/o ear fullness, nasal congestion and cough. This started 2-3 days ago. She is blowing clear mucous out of her nose. She denies ear pain or decreased hearing. The cough is productive of clear mucous. She denies fever, chills or body aches. She has no history of seasonal allergies or breathing problems. She has not had sick contacts.  Review of Systems      Past Medical History:  Diagnosis Date  . GERD (gastroesophageal reflux disease)   . Hypercholesterolemia   . Hypertension   . Migraine headache   . Psoriasis     Family History  Problem Relation Age of Onset  . Asthma Father   . Diabetes Mother   . CVA Mother   . Psoriasis Mother   . Breast cancer Maternal Grandmother   . Breast cancer Paternal Grandmother   . Colon cancer Maternal Uncle   . Colon cancer Unknown        nephew  . Diabetes Unknown        multiple relatives (both sides)    Social History   Socioeconomic History  . Marital status: Single    Spouse name: Not on file  . Number of children: 1  . Years of education: Not on file  . Highest education level: Not on file  Social Needs  . Financial resource strain: Not on file  . Food insecurity - worry: Not on file  . Food insecurity - inability: Not on file  . Transportation needs - medical: Not on file  . Transportation needs - non-medical: Not on file  Occupational History  . Not on file  Tobacco Use  . Smoking status: Never Smoker  . Smokeless tobacco: Never Used  Substance and Sexual Activity  . Alcohol use: No    Alcohol/week: 0.0 oz  . Drug use: No  . Sexual activity: Not on file  Other Topics Concern  . Not on file  Social History Narrative  . Not on file    Allergies  Allergen Reactions  . Iodine      Constitutional: Denies headache, fatigue, fever or abrupt weight changes.  HEENT:  Positive ear fullness, nasal congestion. Denies eye redness, eye pain, pressure behind the eyes, facial pain,  ear pain, ringing in the ears, wax buildup, runny nose or bloody nose. Respiratory: Positive cough. Denies difficulty breathing or shortness of breath.  Cardiovascular: Denies chest pain, chest tightness, palpitations or swelling in the hands or feet.   No other specific complaints in a complete review of systems (except as listed in HPI above).  Objective:   BP 116/78   Pulse 70   Temp 98.2 F (36.8 C) (Oral)   Wt 144 lb (65.3 kg)   SpO2 97%   BMI 21.90 kg/m  Wt Readings from Last 3 Encounters:  01/09/18 144 lb (65.3 kg)  07/21/17 151 lb 12.8 oz (68.9 kg)  01/19/17 156 lb 12.8 oz (71.1 kg)     General: Appears her stated age, in NAD. HEENT: Head: normal shape and size, no sinus tenderness noted; Ears: Tm's gray and intact, normal light reflex, some wax buildup on the left; Nose: mucosa boggy and moist, turbinates swollen; Throat/Mouth: + PND. Teeth present, mucosa pink and moist, no exudate noted, no lesions or ulcerations noted.  Neck: No cervical lymphadenopathy.  Pulmonary/Chest: Normal effort and positive vesicular breath sounds. No respiratory distress. No wheezes, rales or ronchi noted.       Assessment & Plan:  Viral Upper Respiratory Infection with Cough:  Get some rest and drink plenty of water 80 mg Depo IM today Start Zyrtec and Flonase OTC Work note provided  RTC as needed or if symptoms persist.   Webb Silversmith, NP

## 2018-01-09 NOTE — Patient Instructions (Signed)
Upper Respiratory Infection, Adult Most upper respiratory infections (URIs) are caused by a virus. A URI affects the nose, throat, and upper air passages. The most common type of URI is often called "the common cold." Follow these instructions at home:  Take medicines only as told by your doctor.  Gargle warm saltwater or take cough drops to comfort your throat as told by your doctor.  Use a warm mist humidifier or inhale steam from a shower to increase air moisture. This may make it easier to breathe.  Drink enough fluid to keep your pee (urine) clear or pale yellow.  Eat soups and other clear broths.  Have a healthy diet.  Rest as needed.  Go back to work when your fever is gone or your doctor says it is okay. ? You may need to stay home longer to avoid giving your URI to others. ? You can also wear a face mask and wash your hands often to prevent spread of the virus.  Use your inhaler more if you have asthma.  Do not use any tobacco products, including cigarettes, chewing tobacco, or electronic cigarettes. If you need help quitting, ask your doctor. Contact a doctor if:  You are getting worse, not better.  Your symptoms are not helped by medicine.  You have chills.  You are getting more short of breath.  You have brown or red mucus.  You have yellow or brown discharge from your nose.  You have pain in your face, especially when you bend forward.  You have a fever.  You have puffy (swollen) neck glands.  You have pain while swallowing.  You have white areas in the back of your throat. Get help right away if:  You have very bad or constant: ? Headache. ? Ear pain. ? Pain in your forehead, behind your eyes, and over your cheekbones (sinus pain). ? Chest pain.  You have long-lasting (chronic) lung disease and any of the following: ? Wheezing. ? Long-lasting cough. ? Coughing up blood. ? A change in your usual mucus.  You have a stiff neck.  You have  changes in your: ? Vision. ? Hearing. ? Thinking. ? Mood. This information is not intended to replace advice given to you by your health care provider. Make sure you discuss any questions you have with your health care provider. Document Released: 03/28/2008 Document Revised: 06/12/2016 Document Reviewed: 01/15/2014 Elsevier Interactive Patient Education  2018 [employer]  

## 2018-01-22 ENCOUNTER — Ambulatory Visit (INDEPENDENT_AMBULATORY_CARE_PROVIDER_SITE_OTHER): Payer: BLUE CROSS/BLUE SHIELD | Admitting: Internal Medicine

## 2018-01-22 ENCOUNTER — Other Ambulatory Visit: Payer: Self-pay

## 2018-01-22 ENCOUNTER — Encounter: Payer: Self-pay | Admitting: Internal Medicine

## 2018-01-22 VITALS — BP 130/82 | HR 72 | Temp 97.8°F | Resp 18 | Wt 143.4 lb

## 2018-01-22 DIAGNOSIS — E7800 Pure hypercholesterolemia, unspecified: Secondary | ICD-10-CM | POA: Diagnosis not present

## 2018-01-22 DIAGNOSIS — K219 Gastro-esophageal reflux disease without esophagitis: Secondary | ICD-10-CM | POA: Diagnosis not present

## 2018-01-22 DIAGNOSIS — I10 Essential (primary) hypertension: Secondary | ICD-10-CM

## 2018-01-22 DIAGNOSIS — R634 Abnormal weight loss: Secondary | ICD-10-CM

## 2018-01-22 DIAGNOSIS — R739 Hyperglycemia, unspecified: Secondary | ICD-10-CM

## 2018-01-22 DIAGNOSIS — R0981 Nasal congestion: Secondary | ICD-10-CM

## 2018-01-22 DIAGNOSIS — G43809 Other migraine, not intractable, without status migrainosus: Secondary | ICD-10-CM | POA: Diagnosis not present

## 2018-01-22 MED ORDER — ALPRAZOLAM 0.25 MG PO TABS: 0.2500 mg | ORAL_TABLET | Freq: Every day | ORAL | 0 refills | Status: DC | PRN

## 2018-01-22 NOTE — Progress Notes (Signed)
Patient ID: Allison [name], female   DOB: 1951-08-18, 67 y.o.   MRN: [id number]   Subjective:    Patient ID: Allison [name], female    DOB: May 28, 1951, 67 y.o.   MRN: [id number]  HPI  Patient here for a scheduled follow up.  She has been having problems with shoulder pain.  Previously saw Dr Sabra Heck.  S/p injection x 2.  Recently saw Dr Candelaria Stagers.  Was planning for MRI.  Shoulder is better.  Desires no further intervention at this time.  Stays active.  No chest pain.  No sob.  No acid reflux.  Bowels moving.  Was also recently evaluated for cough and congestion.  See 01/09/18 note for details.  Diagnosed with viral URI.  Was treated with zyrtec and flonase.  Had some decreased appetite at this time.  Feeling better now.     Past Medical History:  Diagnosis Date  . GERD (gastroesophageal reflux disease)   . Hypercholesterolemia   . Hypertension   . Migraine headache   . Psoriasis    Past Surgical History:  Procedure Laterality Date  . COLONOSCOPY WITH PROPOFOL N/A 12/23/2016   Procedure: COLONOSCOPY WITH PROPOFOL;  Surgeon: Lollie Sails, MD;  Location: [facility] ENDOSCOPY;  Service: Endoscopy;  Laterality: N/A;  . DILATION AND CURETTAGE OF UTERUS     history of abnormal bleeding  . TUBAL LIGATION     Family History  Problem Relation Age of Onset  . Asthma Father   . Diabetes Mother   . CVA Mother   . Psoriasis Mother   . Breast cancer Maternal Grandmother   . Breast cancer Paternal Grandmother   . Colon cancer Maternal Uncle   . Colon cancer Unknown        nephew  . Diabetes Unknown        multiple relatives (both sides)   Social History   Socioeconomic History  . Marital status: Single    Spouse name: Not on file  . Number of children: 1  . Years of education: Not on file  . Highest education level: Not on file  Occupational History  . Not on file  Social Needs  . Financial resource strain: Not on file  . Food insecurity:    Worry: Not on file    Inability:  Not on file  . Transportation needs:    Medical: Not on file    Non-medical: Not on file  Tobacco Use  . Smoking status: Never Smoker  . Smokeless tobacco: Never Used  Substance and Sexual Activity  . Alcohol use: No    Alcohol/week: 0.0 oz  . Drug use: No  . Sexual activity: Not on file  Lifestyle  . Physical activity:    Days per week: Not on file    Minutes per session: Not on file  . Stress: Not on file  Relationships  . Social connections:    Talks on phone: Not on file    Gets together: Not on file    Attends religious service: Not on file    Active member of club or organization: Not on file    Attends meetings of clubs or organizations: Not on file    Relationship status: Not on file  Other Topics Concern  . Not on file  Social History Narrative  . Not on file    Outpatient Encounter Medications as of 01/22/2018  Medication Sig  . eletriptan (RELPAX) 20 MG tablet Take 1 tablet (20 mg total) by mouth as  needed. may repeat in 2 hours if necessary  . hydrochlorothiazide (MICROZIDE) 12.5 MG capsule TAKE 1 CAPSULE BY MOUTH ONCE DAILY  . hyoscyamine (LEVSIN, ANASPAZ) 0.125 MG tablet Take 0.125 mg by mouth every 4 (four) hours as needed.  . MELOXICAM PO Take by mouth.  Marland Kitchen omeprazole (PRILOSEC) 20 MG capsule Take 20 mg by mouth daily.  . pravastatin (PRAVACHOL) 10 MG tablet TAKE 1 TABLET BY MOUTH ONCE DAILY  . [DISCONTINUED] ALPRAZolam (XANAX) 0.25 MG tablet TAKE 1 TABLET BY MOUTH ONCE DAILY AS NEEDED   No facility-administered encounter medications on file as of 01/22/2018.     Review of Systems  Constitutional: Negative for appetite change and unexpected weight change.  HENT: Positive for congestion. Negative for sinus pressure.   Respiratory: Negative for cough, chest tightness and shortness of breath.   Cardiovascular: Negative for chest pain, palpitations and leg swelling.  Gastrointestinal: Negative for abdominal pain, diarrhea, nausea and vomiting.  Genitourinary:  Negative for difficulty urinating and dysuria.  Musculoskeletal: Negative for myalgias.       Shoulder pain - better.    Skin: Negative for color change and rash.  Neurological: Negative for dizziness, light-headedness and headaches.  Psychiatric/Behavioral: Negative for agitation and dysphoric mood.       Objective:    Physical Exam  Constitutional: She appears well-developed and well-nourished. No distress.  HENT:  Nose: Nose normal.  Mouth/Throat: Oropharynx is clear and moist.  Neck: Neck supple. No thyromegaly present.  Cardiovascular: Normal rate and regular rhythm.  Pulmonary/Chest: Breath sounds normal. No respiratory distress. She has no wheezes.  Abdominal: Soft. Bowel sounds are normal. There is no tenderness.  Musculoskeletal: She exhibits no edema or tenderness.  Lymphadenopathy:    She has no cervical adenopathy.  Skin: No rash noted. No erythema.  Psychiatric: She has a normal mood and affect. Her behavior is normal.    BP 130/82 (BP Location: Left Arm, Patient Position: Sitting, Cuff Size: Normal)   Pulse 72   Temp 97.8 F (36.6 C) (Oral)   Resp 18   Wt 143 lb 6.4 oz (65 kg)   SpO2 97%   BMI 21.80 kg/m  Wt Readings from Last 3 Encounters:  01/22/18 143 lb 6.4 oz (65 kg)  01/09/18 144 lb (65.3 kg)  07/21/17 151 lb 12.8 oz (68.9 kg)     Lab Results  Component Value Date   WBC 7.1 07/21/2017   HGB 14.7 07/21/2017   HCT 44.5 07/21/2017   PLT 261.0 07/21/2017   GLUCOSE 105 (H) 07/21/2017   CHOL 188 07/21/2017   TRIG 95.0 07/21/2017   HDL 57.30 07/21/2017   LDLCALC 112 (H) 07/21/2017   ALT 15 07/21/2017   AST 19 07/21/2017   NA 138 07/21/2017   K 4.1 07/21/2017   CL 99 07/21/2017   CREATININE 0.64 07/21/2017   BUN 17 07/21/2017   CO2 33 (H) 07/21/2017   TSH 1.10 07/21/2017    Mm Screening Breast Tomo Bilateral  Result Date: 07/24/2017 CLINICAL DATA:  Screening. EXAM: 2D DIGITAL SCREENING BILATERAL MAMMOGRAM WITH CAD AND ADJUNCT TOMO  COMPARISON:  Previous exam(s). ACR Breast Density Category b: There are scattered areas of fibroglandular density. FINDINGS: There are no findings suspicious for malignancy. Images were processed with CAD. IMPRESSION: No mammographic evidence of malignancy. A result letter of this screening mammogram will be mailed directly to the patient. RECOMMENDATION: Screening mammogram in one year. (Code:SM-B-01Y) BI-RADS CATEGORY  1: Negative. Electronically Signed   By: Dedra Skeens.D.  On: 07/24/2017 09:53       Assessment & Plan:   Problem List Items Addressed This Visit    GERD (gastroesophageal reflux disease)    Controlled on omeprazole.        Hypercholesterolemia    On pravastatin.  Low cholesterol diet and exercise.  Follow lipid panel and liver function tests.        Relevant Orders   Hepatic function panel   Lipid panel   Hypertension    Blood pressure under good control.  Continue same medication regimen.  Follow pressures.  Follow metabolic panel.        Relevant Orders   CBC with Differential/Platelet   Basic metabolic panel   Migraine headache    Not an issue now.        Weight loss    Weight down from 06/2017.  Stable from March visit.  States she had some decreased appetite when sick.  Feels she is eating better now.  Had colonoscopy 12/23/16.  Schedule labs.  Discussed further evaluation.  She wants to monitor.  Get her back in soon to reassess.         Other Visit Diagnoses    Nasal congestion    -  Primary   Better.  continue zyrtec.  flonase.     Hyperglycemia       Relevant Orders   Hemoglobin A1c       Einar Pheasant, MD

## 2018-01-29 ENCOUNTER — Encounter: Payer: Self-pay | Admitting: Internal Medicine

## 2018-01-29 NOTE — Assessment & Plan Note (Signed)
Controlled on omeprazole.   

## 2018-01-29 NOTE — Assessment & Plan Note (Signed)
Blood pressure under good control.  Continue same medication regimen.  Follow pressures.  Follow metabolic panel.   

## 2018-01-29 NOTE — Assessment & Plan Note (Signed)
Not an issue now

## 2018-01-29 NOTE — Assessment & Plan Note (Signed)
On pravastatin.  Low cholesterol diet and exercise.  Follow lipid panel and liver function tests.   

## 2018-01-29 NOTE — Assessment & Plan Note (Signed)
Weight down from 06/2017.  Stable from March visit.  States she had some decreased appetite when sick.  Feels she is eating better now.  Had colonoscopy 12/23/16.  Schedule labs.  Discussed further evaluation.  She wants to monitor.  Get her back in soon to reassess.

## 2018-02-05 ENCOUNTER — Other Ambulatory Visit: Payer: BLUE CROSS/BLUE SHIELD

## 2018-02-06 ENCOUNTER — Other Ambulatory Visit (INDEPENDENT_AMBULATORY_CARE_PROVIDER_SITE_OTHER): Payer: BLUE CROSS/BLUE SHIELD

## 2018-02-06 ENCOUNTER — Other Ambulatory Visit: Payer: Self-pay | Admitting: Internal Medicine

## 2018-02-06 DIAGNOSIS — E7800 Pure hypercholesterolemia, unspecified: Secondary | ICD-10-CM

## 2018-02-06 DIAGNOSIS — R739 Hyperglycemia, unspecified: Secondary | ICD-10-CM | POA: Diagnosis not present

## 2018-02-06 DIAGNOSIS — I10 Essential (primary) hypertension: Secondary | ICD-10-CM | POA: Diagnosis not present

## 2018-02-06 LAB — BASIC METABOLIC PANEL
BUN: 15 mg/dL (ref 6–23)
CO2: 33 mEq/L — ABNORMAL HIGH (ref 19–32)
Calcium: 9.3 mg/dL (ref 8.4–10.5)
Chloride: 100 mEq/L (ref 96–112)
Creatinine, Ser: 0.7 mg/dL (ref 0.40–1.20)
GFR: 89.14 mL/min (ref >60.00)
Glucose, Bld: 94 mg/dL (ref 70–99)
Potassium: 4.5 mEq/L (ref 3.5–5.1)
Sodium: 138 mEq/L (ref 135–145)

## 2018-02-06 LAB — HEPATIC FUNCTION PANEL
ALT: 14 U/L (ref 0–35)
AST: 17 U/L (ref 0–37)
Albumin: 4.1 g/dL (ref 3.5–5.2)
Alkaline Phosphatase: 54 U/L (ref 39–117)
Bilirubin, Direct: 0.1 mg/dL (ref 0.0–0.3)
Total Bilirubin: 0.6 mg/dL (ref 0.2–1.2)
Total Protein: 6.9 g/dL (ref 6.0–8.3)

## 2018-02-06 LAB — CBC WITH DIFFERENTIAL/PLATELET
Basophils Absolute: 0 10*3/uL (ref 0.0–0.1)
Basophils Relative: 1 % (ref 0.0–3.0)
Eosinophils Absolute: 0.1 10*3/uL (ref 0.0–0.7)
Eosinophils Relative: 3 % (ref 0.0–5.0)
HCT: 42.7 % (ref 36.0–46.0)
Hemoglobin: 14.5 g/dL (ref 12.0–15.0)
Lymphocytes Relative: 23.9 % (ref 12.0–46.0)
Lymphs Abs: 1.1 10*3/uL (ref 0.7–4.0)
MCHC: 34 g/dL (ref 30.0–36.0)
MCV: 92.9 fl (ref 78.0–100.0)
Monocytes Absolute: 0.4 10*3/uL (ref 0.1–1.0)
Monocytes Relative: 9.2 % (ref 3.0–12.0)
Neutro Abs: 2.9 10*3/uL (ref 1.4–7.7)
Neutrophils Relative %: 62.9 % (ref 43.0–77.0)
Platelets: 230 10*3/uL (ref 150.0–400.0)
RBC: 4.6 Mil/uL (ref 3.87–5.11)
RDW: 13.7 % (ref 11.5–15.5)
WBC: 4.5 10*3/uL (ref 4.0–10.5)

## 2018-02-06 LAB — LIPID PANEL
Cholesterol: 188 mg/dL (ref 0–200)
HDL: 64.9 mg/dL (ref >39.00)
LDL Cholesterol: 110 mg/dL — ABNORMAL HIGH (ref 0–99)
NonHDL: 123.23
Total CHOL/HDL Ratio: 3
Triglycerides: 65 mg/dL (ref 0.0–149.0)
VLDL: 13 mg/dL (ref 0.0–40.0)

## 2018-02-06 LAB — HEMOGLOBIN A1C: Hgb A1c MFr Bld: 5.6 % (ref 4.6–6.5)

## 2018-03-12 ENCOUNTER — Other Ambulatory Visit: Payer: Self-pay | Admitting: Internal Medicine

## 2018-03-15 NOTE — Telephone Encounter (Signed)
Last OV 01/22/18 Next OV 04/02/2018 Last refill 01/22/18

## 2018-03-22 ENCOUNTER — Encounter: Payer: Self-pay | Admitting: Internal Medicine

## 2018-03-26 ENCOUNTER — Telehealth: Payer: Self-pay

## 2018-03-26 NOTE — Telephone Encounter (Signed)
Copied from [place] [id number]. Topic: Appointment Scheduling - Scheduling Inquiry for Clinic >> Mar 26, 2018 12:53 PM Nils Flack, Marland Kitchen wrote: Reason for CRM: pt would like to have a copy of her appt reminder sent to her home address.  She was not able to write down the appt at time of call   Appointment reminder mailed

## 2018-03-26 NOTE — Telephone Encounter (Signed)
Lm to call back and schedule an appt

## 2018-03-26 NOTE — Telephone Encounter (Signed)
Copied from [place] [id number]. Topic: General - Other >> Mar 26, 2018  8:50 AM Carolyn Stare wrote:  Pt returned call to reschedule her appt and nothing is available and would like a call back today she has to schedule her transportion  [phone]

## 2018-04-02 ENCOUNTER — Ambulatory Visit: Payer: BLUE CROSS/BLUE SHIELD | Admitting: Internal Medicine

## 2018-04-09 ENCOUNTER — Other Ambulatory Visit: Payer: Self-pay | Admitting: Internal Medicine

## 2018-04-09 DIAGNOSIS — Z1231 Encounter for screening mammogram for malignant neoplasm of breast: Secondary | ICD-10-CM

## 2018-04-19 ENCOUNTER — Other Ambulatory Visit: Payer: Self-pay | Admitting: Internal Medicine

## 2018-04-20 NOTE — Telephone Encounter (Signed)
Last OV 01/22/2018 Next OV 07/27/18 Last refill 03/15/18

## 2018-05-24 ENCOUNTER — Other Ambulatory Visit: Payer: Self-pay | Admitting: Internal Medicine

## 2018-06-03 ENCOUNTER — Other Ambulatory Visit: Payer: Self-pay | Admitting: Internal Medicine

## 2018-06-04 NOTE — Telephone Encounter (Signed)
Last OV 01/22/2018 Next OV 07/27/2018 Last refill 04/20/2018

## 2018-06-07 NOTE — Telephone Encounter (Signed)
faxed

## 2018-07-12 ENCOUNTER — Other Ambulatory Visit: Payer: Self-pay | Admitting: Internal Medicine

## 2018-07-12 NOTE — Telephone Encounter (Signed)
Last OV 01/22/2018   Last refilled 06/05/2018 disp 30 with no refills   Sent to PCP for approval

## 2018-07-13 ENCOUNTER — Other Ambulatory Visit: Payer: Self-pay | Admitting: Sports Medicine

## 2018-07-13 DIAGNOSIS — M7542 Impingement syndrome of left shoulder: Secondary | ICD-10-CM

## 2018-07-13 DIAGNOSIS — G8929 Other chronic pain: Secondary | ICD-10-CM

## 2018-07-13 DIAGNOSIS — M25512 Pain in left shoulder: Principal | ICD-10-CM

## 2018-07-27 ENCOUNTER — Other Ambulatory Visit (HOSPITAL_COMMUNITY)
Admission: RE | Admit: 2018-07-27 | Discharge: 2018-07-27 | Disposition: A | Discharge status: Home / Self Care | Payer: BLUE CROSS/BLUE SHIELD | Source: Ambulatory Visit | Attending: Internal Medicine | Admitting: Internal Medicine

## 2018-07-27 ENCOUNTER — Ambulatory Visit
Admission: RE | Admit: 2018-07-27 | Discharge: 2018-07-27 | Disposition: A | Discharge status: Home / Self Care | Payer: BLUE CROSS/BLUE SHIELD | Source: Ambulatory Visit | Attending: Internal Medicine | Admitting: Internal Medicine

## 2018-07-27 ENCOUNTER — Encounter: Payer: Self-pay | Admitting: Internal Medicine

## 2018-07-27 ENCOUNTER — Ambulatory Visit (INDEPENDENT_AMBULATORY_CARE_PROVIDER_SITE_OTHER): Payer: BLUE CROSS/BLUE SHIELD | Admitting: Internal Medicine

## 2018-07-27 VITALS — BP 120/70 | HR 67 | Temp 97.6°F | Resp 17 | Ht 68.0 in | Wt 142.5 lb

## 2018-07-27 DIAGNOSIS — Z124 Encounter for screening for malignant neoplasm of cervix: Secondary | ICD-10-CM | POA: Insufficient documentation

## 2018-07-27 DIAGNOSIS — Z1231 Encounter for screening mammogram for malignant neoplasm of breast: Secondary | ICD-10-CM | POA: Diagnosis present

## 2018-07-27 DIAGNOSIS — K219 Gastro-esophageal reflux disease without esophagitis: Secondary | ICD-10-CM

## 2018-07-27 DIAGNOSIS — E7800 Pure hypercholesterolemia, unspecified: Secondary | ICD-10-CM | POA: Diagnosis not present

## 2018-07-27 DIAGNOSIS — I10 Essential (primary) hypertension: Secondary | ICD-10-CM | POA: Diagnosis not present

## 2018-07-27 DIAGNOSIS — R634 Abnormal weight loss: Secondary | ICD-10-CM

## 2018-07-27 DIAGNOSIS — Z0000 Encounter for general adult medical examination without abnormal findings: Secondary | ICD-10-CM | POA: Diagnosis not present

## 2018-07-27 DIAGNOSIS — Z23 Encounter for immunization: Secondary | ICD-10-CM

## 2018-07-27 NOTE — Progress Notes (Signed)
Patient ID: Allison [name], female   DOB: [birth year], 67 y.o.   MRN: [id number]   Subjective:    Patient ID: Allison [name], female    DOB: 12-04-1950, 67 y.o.   MRN: [id number]  HPI  Patient here for her physical exam.  She reports she is doing relatively well.  Has been seeing ortho for her shoulder.  Planning for MRI shoulder.  She is still driving.  Eating. No nausea or vomiting.  No abdominal pain.  Bowels moving.  No chest pain. No sob.  Takes levsin for her bowels.  Helps.     Past Medical History:  Diagnosis Date  . GERD (gastroesophageal reflux disease)   . Hypercholesterolemia   . Hypertension   . Migraine headache   . Psoriasis    Past Surgical History:  Procedure Laterality Date  . COLONOSCOPY WITH PROPOFOL N/A 12/23/2016   Procedure: COLONOSCOPY WITH PROPOFOL;  Surgeon: Lollie Sails, MD;  Location: [facility] ENDOSCOPY;  Service: Endoscopy;  Laterality: N/A;  . DILATION AND CURETTAGE OF UTERUS     history of abnormal bleeding  . TUBAL LIGATION     Family History  Problem Relation Age of Onset  . Asthma Father   . Diabetes Mother   . CVA Mother   . Psoriasis Mother   . Breast cancer Maternal Grandmother   . Breast cancer Paternal Grandmother   . Colon cancer Maternal Uncle   . Colon cancer Unknown        nephew  . Diabetes Unknown        multiple relatives (both sides)   Social History   Socioeconomic History  . Marital status: Single    Spouse name: Not on file  . Number of children: 1  . Years of education: Not on file  . Highest education level: Not on file  Occupational History  . Not on file  Social Needs  . Financial resource strain: Not on file  . Food insecurity:    Worry: Not on file    Inability: Not on file  . Transportation needs:    Medical: Not on file    Non-medical: Not on file  Tobacco Use  . Smoking status: Never Smoker  . Smokeless tobacco: Never Used  Substance and Sexual Activity  . Alcohol use: No    Alcohol/week:  0.0 standard drinks  . Drug use: No  . Sexual activity: Not on file  Lifestyle  . Physical activity:    Days per week: Not on file    Minutes per session: Not on file  . Stress: Not on file  Relationships  . Social connections:    Talks on phone: Not on file    Gets together: Not on file    Attends religious service: Not on file    Active member of club or organization: Not on file    Attends meetings of clubs or organizations: Not on file    Relationship status: Not on file  Other Topics Concern  . Not on file  Social History Narrative  . Not on file    Outpatient Encounter Medications as of 07/27/2018  Medication Sig  . ALPRAZolam (XANAX) 0.25 MG tablet TAKE 1 TABLET BY MOUTH ONCE DAILY AS NEEDED  . eletriptan (RELPAX) 20 MG tablet Take 1 tablet (20 mg total) by mouth as needed. may repeat in 2 hours if necessary  . hydrochlorothiazide (MICROZIDE) 12.5 MG capsule TAKE 1 CAPSULE BY MOUTH ONCE DAILY  . hyoscyamine (LEVSIN, ANASPAZ) 0.125  MG tablet Take 0.125 mg by mouth every 4 (four) hours as needed.  . MELOXICAM PO Take by mouth.  Marland Kitchen omeprazole (PRILOSEC) 20 MG capsule Take 20 mg by mouth daily.  . pravastatin (PRAVACHOL) 10 MG tablet TAKE 1 TABLET BY MOUTH ONCE DAILY   No facility-administered encounter medications on file as of 07/27/2018.     Review of Systems  Constitutional: Negative for appetite change and unexpected weight change.  HENT: Negative for congestion and sinus pressure.   Eyes: Negative for pain and visual disturbance.  Respiratory: Negative for cough, chest tightness and shortness of breath.   Cardiovascular: Negative for chest pain, palpitations and leg swelling.  Gastrointestinal: Negative for abdominal pain, diarrhea, nausea and vomiting.  Genitourinary: Negative for difficulty urinating and dysuria.  Musculoskeletal: Negative for joint swelling and myalgias.  Skin: Negative for color change and rash.  Neurological: Negative for dizziness,  light-headedness and headaches.  Hematological: Negative for adenopathy. Does not bruise/bleed easily.  Psychiatric/Behavioral: Negative for agitation and dysphoric mood.       Objective:    Physical Exam  Constitutional: She is oriented to person, place, and time. She appears well-developed and well-nourished. No distress.  HENT:  Nose: Nose normal.  Mouth/Throat: Oropharynx is clear and moist.  Eyes: Right eye exhibits no discharge. Left eye exhibits no discharge. No scleral icterus.  Neck: Neck supple. No thyromegaly present.  Cardiovascular: Normal rate and regular rhythm.  Pulmonary/Chest: Breath sounds normal. No accessory muscle usage. No tachypnea. No respiratory distress. She has no decreased breath sounds. She has no wheezes. She has no rhonchi. Right breast exhibits no inverted nipple, no mass, no nipple discharge and no tenderness (no axillary adenopathy). Left breast exhibits no inverted nipple, no mass, no nipple discharge and no tenderness (no axilarry adenopathy).  Abdominal: Soft. Bowel sounds are normal. There is no tenderness.  Genitourinary:  Genitourinary Comments: Normal external genitalia.  Vaginal vault without lesions.  Cervix identified.  Pap smear performed.  Could not appreciate any adnexal masses or tenderness.    Musculoskeletal: She exhibits no edema or tenderness.  Lymphadenopathy:    She has no cervical adenopathy.  Neurological: She is alert and oriented to person, place, and time.  Skin: No rash noted. No erythema.  Psychiatric: She has a normal mood and affect. Her behavior is normal.    BP 120/70   Pulse 67   Temp 97.6 F (36.4 C) (Oral)   Resp 17   Ht 5\' 8"  (1.727 m)   Wt 142 lb 8 oz (64.6 kg)   SpO2 98%   BMI 21.67 kg/m  Wt Readings from Last 3 Encounters:  07/27/18 142 lb 8 oz (64.6 kg)  01/22/18 143 lb 6.4 oz (65 kg)  01/09/18 144 lb (65.3 kg)     Lab Results  Component Value Date   WBC 6.2 07/27/2018   HGB 14.6 07/27/2018    HCT 42.0 07/27/2018   PLT 271 07/27/2018   GLUCOSE 85 07/27/2018   CHOL 181 07/27/2018   TRIG 59 07/27/2018   HDL 64 07/27/2018   LDLCALC 103 (H) 07/27/2018   ALT 11 07/27/2018   AST 19 07/27/2018   NA 140 07/27/2018   K 3.9 07/27/2018   CL 99 07/27/2018   CREATININE 0.75 07/27/2018   BUN 14 07/27/2018   CO2 28 07/27/2018   TSH 0.53 07/27/2018   HGBA1C 5.6 02/06/2018       Assessment & Plan:   Problem List Items Addressed This Visit  GERD (gastroesophageal reflux disease)    Controlled on current regimen.  Follow.        Health care maintenance - Primary    Physical today 07/27/18.  PAP 07/13/15 - negative with negative HPV.  Repeat PAP today.  Colonoscopy 09/2014 - recommended f/u colonoscopy in 5 years.  Had mammogram today.        Hypercholesterolemia    On pravastatin.  Low cholesterol diet and exercise.  Follow lipid panel and liver function tests.        Relevant Orders   Hepatic function panel (Completed)   Lipid panel (Completed)   Hypertension    Blood pressure under good control.  Continue same medication regimen.  Follow pressures.  Follow metabolic panel.        Relevant Orders   CBC with Differential/Platelet (Completed)   TSH (Completed)   Basic metabolic panel (Completed)   Weight loss    Eating well.  Good appetite.  No nausea or vomiting.  Follow weight.  Check routine labs.         Other Visit Diagnoses    Encounter for immunization       Relevant Orders   Flu vaccine HIGH DOSE PF (Completed)   Pap smear for cervical cancer screening       Relevant Orders   Cytology - PAP   Routine general medical examination at a health care facility           Einar Pheasant, MD

## 2018-07-27 NOTE — Assessment & Plan Note (Addendum)
Physical today 07/27/18.  PAP 07/13/15 - negative with negative HPV.  Repeat PAP today.  Colonoscopy 09/2014 - recommended f/u colonoscopy in 5 years.  Had mammogram today.

## 2018-07-28 LAB — BASIC METABOLIC PANEL
BUN: 14 mg/dL (ref 7–25)
CO2: 28 mmol/L (ref 20–32)
Calcium: 9.3 mg/dL (ref 8.6–10.4)
Chloride: 99 mmol/L (ref 98–110)
Creat: 0.75 mg/dL (ref 0.50–0.99)
Glucose, Bld: 85 mg/dL (ref 65–99)
Potassium: 3.9 mmol/L (ref 3.5–5.3)
Sodium: 140 mmol/L (ref 135–146)

## 2018-07-28 LAB — SPECIMEN COMPROMISED

## 2018-07-28 LAB — HEPATIC FUNCTION PANEL
AG Ratio: 1.7 (calc) (ref 1.0–2.5)
ALT: 11 U/L (ref 6–29)
AST: 19 U/L (ref 10–35)
Albumin: 4.2 g/dL (ref 3.6–5.1)
Alkaline phosphatase (APISO): 55 U/L (ref 33–130)
Bilirubin, Direct: 0.1 mg/dL (ref 0.0–0.2)
Globulin: 2.5 g/dL (calc) (ref 1.9–3.7)
Indirect Bilirubin: 0.6 mg/dL (calc) (ref 0.2–1.2)
Total Bilirubin: 0.7 mg/dL (ref 0.2–1.2)
Total Protein: 6.7 g/dL (ref 6.1–8.1)

## 2018-07-28 LAB — LIPID PANEL
Cholesterol: 181 mg/dL (ref <200)
HDL: 64 mg/dL (ref >50)
LDL Cholesterol (Calc): 103 mg/dL (calc) — ABNORMAL HIGH
Non-HDL Cholesterol (Calc): 117 mg/dL (calc) (ref <130)
Total CHOL/HDL Ratio: 2.8 (calc) (ref <5.0)
Triglycerides: 59 mg/dL (ref <150)

## 2018-07-28 LAB — CBC WITH DIFFERENTIAL/PLATELET
Basophils Absolute: 62 cells/uL (ref 0–200)
Basophils Relative: 1 %
Eosinophils Absolute: 130 cells/uL (ref 15–500)
Eosinophils Relative: 2.1 %
HCT: 42 % (ref 35.0–45.0)
Hemoglobin: 14.6 g/dL (ref 11.7–15.5)
Lymphs Abs: 1358 cells/uL (ref 850–3900)
MCH: 31.3 pg (ref 27.0–33.0)
MCHC: 34.8 g/dL (ref 32.0–36.0)
MCV: 90.1 fL (ref 80.0–100.0)
MPV: 11.4 fL (ref 7.5–12.5)
Monocytes Relative: 10.3 %
Neutro Abs: 4011 cells/uL (ref 1500–7800)
Neutrophils Relative %: 64.7 %
Platelets: 271 10*3/uL (ref 140–400)
RBC: 4.66 10*6/uL (ref 3.80–5.10)
RDW: 12.8 % (ref 11.0–15.0)
Total Lymphocyte: 21.9 %
WBC mixed population: 639 cells/uL (ref 200–950)
WBC: 6.2 10*3/uL (ref 3.8–10.8)

## 2018-07-28 LAB — TSH: TSH: 0.53 mIU/L (ref 0.40–4.50)

## 2018-07-30 ENCOUNTER — Encounter: Payer: Self-pay | Admitting: Internal Medicine

## 2018-07-30 NOTE — Assessment & Plan Note (Signed)
On pravastatin.  Low cholesterol diet and exercise.  Follow lipid panel and liver function tests.   

## 2018-07-30 NOTE — Assessment & Plan Note (Signed)
Blood pressure under good control.  Continue same medication regimen.  Follow pressures.  Follow metabolic panel.   

## 2018-07-30 NOTE — Assessment & Plan Note (Signed)
Eating well.  Good appetite.  No nausea or vomiting.  Follow weight.  Check routine labs.

## 2018-07-30 NOTE — Assessment & Plan Note (Signed)
Controlled on current regimen.  Follow.  

## 2018-07-31 ENCOUNTER — Other Ambulatory Visit: Payer: Self-pay | Admitting: Internal Medicine

## 2018-07-31 LAB — CYTOLOGY - PAP
Diagnosis: NEGATIVE
HPV: NOT DETECTED

## 2018-07-31 MED ORDER — OMEPRAZOLE 20 MG PO CPDR: 20.0000 mg | DELAYED_RELEASE_CAPSULE | Freq: Every day | ORAL | 1 refills | Status: DC

## 2018-07-31 NOTE — Progress Notes (Signed)
Refills sent in for omeprazole #30 with one refill.

## 2018-08-25 ENCOUNTER — Other Ambulatory Visit: Payer: Self-pay | Admitting: Internal Medicine

## 2018-08-27 NOTE — Telephone Encounter (Signed)
Last OV 07/27/2018   Last refilled 07/12/2018 disp 30 with no refills   Sent to PCP for approval

## 2018-09-12 ENCOUNTER — Telehealth: Payer: Self-pay | Admitting: Internal Medicine

## 2018-09-12 NOTE — Telephone Encounter (Signed)
This was sent in by a historical provider. Ok to refill?

## 2018-09-12 NOTE — Telephone Encounter (Signed)
Who has been refilling the medication.  If GI, does she plan to f/u with them.  If so, they will need to refill.  Also, confirm how she is taking the medication.

## 2018-09-12 NOTE — Telephone Encounter (Signed)
Copied from [place] [id number]. Topic: Quick Communication - Rx Refill/Question >> Sep 12, 2018  9:22 AM Reyne Dumas L wrote: Medication: hyoscyamine (LEVSIN, ANASPAZ) 0.125 MG tablet  Has the patient contacted their pharmacy? Yes - states they haven't heard from [place].  Pt states this will be the first time PCP is filling.  Pt states that she has enough for three days left. (Agent: If no, request that the patient contact the pharmacy for the refill.) (Agent: If yes, when and what did the pharmacy advise?)  Preferred Pharmacy (with phone number or street name): [facility] (N), [place] [phone] (Phone) [phone] (Fax)  Agent: Please be advised that RX refills may take up to 3 business days. We ask that you follow-up with your pharmacy.

## 2018-09-14 ENCOUNTER — Other Ambulatory Visit: Payer: Self-pay | Admitting: Internal Medicine

## 2018-09-14 MED ORDER — HYOSCYAMINE SULFATE 0.125 MG PO TABS: 0.1250 mg | ORAL_TABLET | Freq: Four times a day (QID) | ORAL | 0 refills | Status: DC | PRN

## 2018-09-14 NOTE — Telephone Encounter (Signed)
LMTCB

## 2018-09-14 NOTE — Telephone Encounter (Signed)
Left detailed message for patient.

## 2018-09-14 NOTE — Telephone Encounter (Signed)
ok'd rx for levsin #30 with no refills.

## 2018-09-14 NOTE — Telephone Encounter (Signed)
Patient says that Dr. Ellin Mayhew was refilling but she is not following up with them. The directions are 1 tablet q 6h prn for cramping or diarrhea

## 2018-09-25 ENCOUNTER — Ambulatory Visit
Admission: RE | Admit: 2018-09-25 | Discharge: 2018-09-25 | Disposition: A | Discharge status: Home / Self Care | Payer: BLUE CROSS/BLUE SHIELD | Source: Ambulatory Visit | Attending: Sports Medicine | Admitting: Sports Medicine

## 2018-09-25 DIAGNOSIS — M19012 Primary osteoarthritis, left shoulder: Secondary | ICD-10-CM | POA: Insufficient documentation

## 2018-09-25 DIAGNOSIS — M7552 Bursitis of left shoulder: Secondary | ICD-10-CM | POA: Insufficient documentation

## 2018-09-25 DIAGNOSIS — G8929 Other chronic pain: Secondary | ICD-10-CM | POA: Insufficient documentation

## 2018-09-25 DIAGNOSIS — M25512 Pain in left shoulder: Secondary | ICD-10-CM | POA: Diagnosis present

## 2018-09-25 DIAGNOSIS — M7542 Impingement syndrome of left shoulder: Secondary | ICD-10-CM

## 2018-12-30 ENCOUNTER — Other Ambulatory Visit: Payer: Self-pay | Admitting: Internal Medicine

## 2019-01-11 ENCOUNTER — Telehealth: Payer: Self-pay | Admitting: Internal Medicine

## 2019-01-11 ENCOUNTER — Other Ambulatory Visit: Payer: Self-pay | Admitting: Internal Medicine

## 2019-01-11 NOTE — Telephone Encounter (Signed)
Patient is not having any issues at the moment but says that this was discussed at her visit in October. It was previously prescribed by Dr. Ellin Mayhew but says that we were going to start prescribing so she didn't have to go back to them. She takes one every now and then if she has a flare up of gastritis. Advised that this is an abx and was not sure if we could prescribe. Advised I would send message over and let her know. She says that's fine.

## 2019-01-11 NOTE — Telephone Encounter (Signed)
Please notify pt that if GI prescribed to have as needed, I would recommend her calling them for refill.  That way they can prescribe correctly - what medication is needed.

## 2019-01-11 NOTE — Telephone Encounter (Signed)
Last OV 07/27/2018   Last refilled 08/27/2018 disp 30 with no refills   Next appt 02/04/2019  Sent to PCP for approval

## 2019-01-11 NOTE — Telephone Encounter (Signed)
Copied from [place] [phone] [phone]. Topic: General - Other >> Jan 11, 2019  7:28 AM Lennox Solders wrote: Reason for CRM: Pt leave voice mail and would like refill on amoxicillin 875 mg #28 for gi issue. [facility] [address]. Pt has an appt with dr Nicki Reaper in April 2020

## 2019-01-14 NOTE — Telephone Encounter (Signed)
Patient is aware 

## 2019-01-31 ENCOUNTER — Ambulatory Visit: Payer: Self-pay | Admitting: *Deleted

## 2019-01-31 ENCOUNTER — Emergency Department
Admission: EM | Admit: 2019-01-31 | Discharge: 2019-01-31 | Disposition: A | Discharge status: Home / Self Care | Payer: Medicare Other | Attending: Emergency Medicine | Admitting: Emergency Medicine

## 2019-01-31 ENCOUNTER — Telehealth: Payer: Self-pay

## 2019-01-31 ENCOUNTER — Other Ambulatory Visit: Payer: Self-pay

## 2019-01-31 ENCOUNTER — Emergency Department: Payer: Medicare Other

## 2019-01-31 DIAGNOSIS — Z79899 Other long term (current) drug therapy: Secondary | ICD-10-CM | POA: Insufficient documentation

## 2019-01-31 DIAGNOSIS — I471 Supraventricular tachycardia: Secondary | ICD-10-CM

## 2019-01-31 DIAGNOSIS — I10 Essential (primary) hypertension: Secondary | ICD-10-CM | POA: Diagnosis not present

## 2019-01-31 DIAGNOSIS — R000 Tachycardia, unspecified: Secondary | ICD-10-CM | POA: Diagnosis not present

## 2019-01-31 DIAGNOSIS — I4891 Unspecified atrial fibrillation: Secondary | ICD-10-CM | POA: Insufficient documentation

## 2019-01-31 LAB — BASIC METABOLIC PANEL
Anion gap: 12 (ref 5–15)
BUN: 14 mg/dL (ref 8–23)
CO2: 27 mmol/L (ref 22–32)
Calcium: 9.2 mg/dL (ref 8.9–10.3)
Chloride: 102 mmol/L (ref 98–111)
Creatinine, Ser: 0.76 mg/dL (ref 0.44–1.00)
GFR calc Af Amer: >60 mL/min (ref >60)
GFR calc non Af Amer: >60 mL/min (ref >60)
Glucose, Bld: 143 mg/dL — ABNORMAL HIGH (ref 70–99)
Potassium: 3.2 mmol/L — ABNORMAL LOW (ref 3.5–5.1)
Sodium: 141 mmol/L (ref 135–145)

## 2019-01-31 LAB — CBC
HCT: 45.3 % (ref 36.0–46.0)
Hemoglobin: 15.4 g/dL — ABNORMAL HIGH (ref 12.0–15.0)
MCH: 31.2 pg (ref 26.0–34.0)
MCHC: 34 g/dL (ref 30.0–36.0)
MCV: 91.7 fL (ref 80.0–100.0)
Platelets: 285 10*3/uL (ref 150–400)
RBC: 4.94 MIL/uL (ref 3.87–5.11)
RDW: 12.4 % (ref 11.5–15.5)
WBC: 7.9 10*3/uL (ref 4.0–10.5)
nRBC: 0 % (ref 0.0–0.2)

## 2019-01-31 LAB — TSH: TSH: 1.149 u[IU]/mL (ref 0.350–4.500)

## 2019-01-31 LAB — TROPONIN I: Troponin I: <0.03 ng/mL (ref <0.03)

## 2019-01-31 MED ORDER — DILTIAZEM HCL 25 MG/5ML IV SOLN
10.0000 mg | Freq: Once | INTRAVENOUS | Status: AC
  Administered 2019-01-31: 10 mg via INTRAVENOUS

## 2019-01-31 MED ORDER — SODIUM CHLORIDE 0.9 % IV SOLN
Freq: Once | INTRAVENOUS | Status: AC
  Administered 2019-01-31: 15:00:00 via INTRAVENOUS

## 2019-01-31 MED ORDER — DILTIAZEM HCL ER COATED BEADS 120 MG PO CP24
120.0000 mg | ORAL_CAPSULE | Freq: Once | ORAL | Status: AC
  Administered 2019-01-31: 120 mg via ORAL
  Filled 2019-01-31: qty 1

## 2019-01-31 MED ORDER — DILTIAZEM HCL 25 MG/5ML IV SOLN
5.0000 mg | Freq: Once | INTRAVENOUS | Status: AC
  Administered 2019-01-31: 5 mg via INTRAVENOUS

## 2019-01-31 MED ORDER — DILTIAZEM HCL 25 MG/5ML IV SOLN
INTRAVENOUS | Status: AC
  Administered 2019-01-31: 5 mg via INTRAVENOUS
  Filled 2019-01-31: qty 5

## 2019-01-31 MED ORDER — DILTIAZEM HCL ER COATED BEADS 120 MG PO CP24: 120.0000 mg | ORAL_CAPSULE | Freq: Every day | ORAL | 11 refills | Status: DC

## 2019-01-31 MED ORDER — SODIUM CHLORIDE 0.9 % IV BOLUS
500.0000 mL | Freq: Once | INTRAVENOUS | Status: AC
  Administered 2019-01-31: 500 mL via INTRAVENOUS

## 2019-01-31 MED ORDER — SODIUM CHLORIDE 0.9% FLUSH
3.0000 mL | Freq: Once | INTRAVENOUS | Status: AC
  Administered 2019-01-31: 15:00:00 3 mL via INTRAVENOUS

## 2019-01-31 NOTE — Telephone Encounter (Signed)
Patient says she was lifting light weights and and started to feel like her heart was racing checked her pulse was 164 BP 129/80. Patient was not a stressful or strenuous workout, patient also felt while mowing earlier in the week.NO dizziness, No SOB , No chest pain per patient. Patient checked BP manually per nurse pulse 80 patient checked with machine recorded 152, and checked manually again 86 per minute . Patient has a virtual visit on Monday with PCP.

## 2019-01-31 NOTE — ED Notes (Signed)
Pt assisted to the bathroom by this RN at this time.

## 2019-01-31 NOTE — Telephone Encounter (Signed)
-----   Message from Minna Merritts, MD sent at 01/31/2019  3:45 PM EDT ----- Regarding: er Seen in the emergency room January 31, 2019 for atrial fibrillation with RVR rate 190 Given medications in the ER and broke to normal sinus rhythm Needs cardiology follow-up with web visit Thx TG

## 2019-01-31 NOTE — ED Notes (Signed)
Pt given phone to call her daughter for a ride. EDP at bedside to update patient. Will continue to monitor for further patient needs.

## 2019-01-31 NOTE — ED Notes (Signed)
NAD noted at time of D/C. Pt taken to lobby via wheelchair by this RN at this time. Pt denies comments/concerns regarding D/C instructions.

## 2019-01-31 NOTE — Telephone Encounter (Signed)
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

## 2019-01-31 NOTE — ED Provider Notes (Signed)
[facility] Emergency Department Provider Note   ____________________________________________   First MD Initiated Contact with Patient 01/31/19 1406     (approximate)  I have reviewed the triage vital signs and the nursing notes.   HISTORY  Chief Complaint Tachycardia   HPI Allison [name] is a 68 y.o. female who reports she had heart racing since about 11:00 this morning.  She is never had this before.  She does not have a history of irregular heartbeat she says.  She is not having any chest pain or shortness of breath just the heart racing.  Patient had 2 cups of coffee today instead of her usual 1.  She does not drink or do drugs.  She has no other medical problems except for as listed below in [place].         Past Medical History:  Diagnosis Date  . GERD (gastroesophageal reflux disease)   . Hypercholesterolemia   . Hypertension   . Migraine headache   . Psoriasis     Patient Active Problem List   Diagnosis Date Noted  . Loose stools 07/23/2017  . Weight loss 07/23/2017  . 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 LLQ abdominal pain 09/14/2016  . Diverticulosis 04/18/2016  . Rib pain on right side 02/17/2016  . Right shoulder pain 01/24/2016  . Health care maintenance 05/04/2015  . Leg cramps 05/01/2015  . Unspecified constipation 07/13/2014  . Eye irritation 11/14/2013  . Hypertension 10/15/2012  . Hypercholesterolemia 10/15/2012  . GERD (gastroesophageal reflux disease) 10/15/2012  . Migraine headache 10/15/2012    Past Surgical History:  Procedure Laterality Date  . COLONOSCOPY WITH PROPOFOL N/A 12/23/2016   Procedure: COLONOSCOPY WITH PROPOFOL;  Surgeon: Lollie Sails, MD;  Location: [facility] ENDOSCOPY;  Service: Endoscopy;  Laterality: N/A;  . DILATION AND CURETTAGE OF UTERUS     history of abnormal bleeding  . TUBAL LIGATION      Prior to Admission medications   Medication Sig Start Date End Date Taking? Authorizing Provider   ALPRAZolam Duanne Moron) 0.25 MG tablet TAKE 1 TABLET BY MOUTH ONCE DAILY AS NEEDED 01/11/19   Einar Pheasant, MD  diltiazem (CARDIZEM CD) 120 MG 24 hr capsule Take 1 capsule (120 mg total) by mouth daily. 01/31/19 01/31/20  Nena Polio, MD  eletriptan (RELPAX) 20 MG tablet Take 1 tablet (20 mg total) by mouth as needed. may repeat in 2 hours if necessary 01/22/16   Einar Pheasant, MD  hydrochlorothiazide (MICROZIDE) 12.5 MG capsule TAKE 1 CAPSULE BY MOUTH ONCE DAILY 05/25/18   Einar Pheasant, MD  hyoscyamine (LEVSIN, ANASPAZ) 0.125 MG tablet TAKE 1 TABLET BY MOUTH EVERY 6 HOURS AS NEEDED 12/31/18   Einar Pheasant, MD  MELOXICAM PO Take by mouth.    [provider]  omeprazole (PRILOSEC) 20 MG capsule Take 1 capsule (20 mg total) by mouth daily. 07/31/18   Einar Pheasant, MD  pravastatin (PRAVACHOL) 10 MG tablet TAKE 1 TABLET BY MOUTH ONCE DAILY 09/14/18   Einar Pheasant, MD    Allergies Iodine  Family History  Problem Relation Age of Onset  . Asthma Father   . Diabetes Mother   . CVA Mother   . Psoriasis Mother   . Breast cancer Maternal Grandmother   . Breast cancer Paternal Grandmother   . Colon cancer Maternal Uncle   . Colon cancer Unknown        nephew  . Diabetes Unknown        multiple relatives (both sides)  Social History Social History   Tobacco Use  . Smoking status: Never Smoker  . Smokeless tobacco: Never Used  Substance Use Topics  . Alcohol use: No    Alcohol/week: 0.0 standard drinks  . Drug use: No    Review of Systems  Constitutional: No fever/chills Eyes: No visual changes. ENT: No sore throat. Cardiovascular: Denies chest pain. Respiratory: Denies shortness of breath. Gastrointestinal: No abdominal pain.  No nausea, no vomiting.  No diarrhea.  No constipation. Genitourinary: Negative for dysuria. Musculoskeletal: Negative for back pain. Skin: Negative for rash. Neurological: Negative for headaches, focal weakness   ____________________________________________   PHYSICAL EXAM:  VITAL SIGNS: ED Triage Vitals  Enc Vitals Group     BP 01/31/19 1403 114/85     Pulse Rate 01/31/19 1403 (!) 198     Resp 01/31/19 1403 18     Temp 01/31/19 1403 98.6 F (37 C)     Temp Source 01/31/19 1403 Oral     SpO2 01/31/19 1403 98 %     Weight 01/31/19 1422 130 lb (59 kg)     Height 01/31/19 1422 5\' 8"  (1.727 m)     Head Circumference --      Peak Flow --      Pain Score 01/31/19 1356 0     Pain Loc --      Pain Edu? --      Excl. in [place]? --     Constitutional: Alert and oriented. Well appearing and in no acute distress. Eyes: Conjunctivae are normal. PERRL. EOMI. Head: Atraumatic. Nose: No congestion/rhinnorhea. Mouth/Throat: Mucous membranes are moist.  Oropharynx non-erythematous. Neck: No stridor. Cardiovascular: Normal rate, regular rhythm. Grossly normal heart sounds.  Good peripheral circulation. Respiratory: Normal respiratory effort.  No retractions. Lungs CTAB. Gastrointestinal: Soft and nontender. No distention. No abdominal bruits. No CVA tenderness. Musculoskeletal: No lower extremity tenderness nor edema.  Neurologic:  Normal speech and language. No gross focal neurologic deficits are appreciated. No gait instability. Skin:  Skin is warm, dry and intact. No rash noted. Psychiatric: Mood and affect are normal. Speech and behavior are normal.  ____________________________________________   LABS (all labs ordered are listed, but only abnormal results are displayed)  Labs Reviewed  BASIC METABOLIC PANEL - Abnormal; Notable for the following components:      Result Value   Potassium 3.2 (*)    Glucose, Bld 143 (*)    All other components within normal limits  CBC - Abnormal; Notable for the following components:   Hemoglobin 15.4 (*)    All other components within normal limits  TSH  TROPONIN I   ____________________________________________  EKG  EKG shows A. fib with RVR at a  rate of 193.  Normal axis rate related changes only. EKG #2 shows a flutter at a rate of 98 normal axis no acute ST-T changes ____________________________________________  RADIOLOGY  ED MD interpretation: Chest x-ray read by radiology reviewed by me shows only changes.  Official radiology report(s): Dg Chest 1 View  Result Date: 01/31/2019 CLINICAL DATA:  68 year old female with a history of tachycardia EXAM: CHEST  1 VIEW COMPARISON:  02/17/2016 FINDINGS: Cardiomediastinal silhouette unchanged in size and contour. No evidence of central vascular congestion. No pneumothorax or pleural effusion. No confluent airspace disease. Coarsened interstitial markings. No displaced fracture. Mild scoliotic curvature is unchanged. IMPRESSION: Chronic lung changes without evidence of superimposed acute cardiopulmonary disease Electronically Signed   By: Corrie Mckusick D.O.   On: 01/31/2019 14:49    ____________________________________________  PROCEDURES  Procedure(s) performed (including Critical Care):  Procedures   ____________________________________________   INITIAL IMPRESSION / ASSESSMENT AND PLAN / ED COURSE  Patient given 5 of diltiazem with her heart rate slowing down to 130.  Another 10 of diltiazem and she converted to normal sinus rhythm.  I discussed the patient with Dr. Rockey Situ.  We will take her off her HCTZ for now and switch her to [place] and was in CD 120 a day.  This should keep her blood pressure down without running and risk of having it go too low.  It should also keep her from having another episode of A. fib flutter with RVR at a rate of 190 which is what she came in with.              ____________________________________________   FINAL CLINICAL IMPRESSION(S) / ED DIAGNOSES  Final diagnoses:  Atrial fibrillation with rapid ventricular response [facility])     ED Discharge Orders         Ordered    diltiazem (CARDIZEM CD) 120 MG 24 hr capsule  Daily     01/31/19  1547           Note:  This document was prepared using Dragon voice recognition software and may include unintentional dictation errors.    Nena Polio, MD 01/31/19 1622

## 2019-01-31 NOTE — Telephone Encounter (Signed)
Not sure if she is getting an accurate reading or if her machine.  May not be conducting - for her to check pulse.  Would have to be evaluated to know rhythm, rate, etc.

## 2019-01-31 NOTE — Telephone Encounter (Signed)
Pt reports "Working out with [employer] and felt my heart racing", checked with home monitor, BP 129/80 HRR 164. States not an unusual or more strenuous workout. States also felt heart race when mowing lawn earlier in week. Unsure if increased rate was sudden. Denies dizziness,no SOB, no CP or chest tightness. States is staying hydrated. TN had pt check HR manually during call, 80 after 15 sec.  and 86 full minute. Pt then checked with monitor, 152. TN called practice per protocol to advise regarding consideration for virtual appt. Pt has appt scheduled for Monday, 6 month F/U. Spoke with Tye [place], will route triage note for Dr. Bary Leriche review. CAre advise given to patient, directed to ED/UC if dizziness, CP,SOB, palpitations occur. Pt verbalizes understanding. Pt's email and phone # verified.  CB [phone]  Reason for Disposition . Age > 60 years (Exception: brief heart beat symptoms that went away and now feels well)  Answer Assessment - Initial Assessment Questions 1. DESCRIPTION: "Please describe your heart rate or heart beat that you are having" (e.g., fast/slow, regular/irregular, skipped or extra beats, "palpitations")     164 per monitor, 80 and 86 pulse check during call 2. ONSET: "When did it start?" (Minutes, hours or days)      Hour ago 3. DURATION: "How long does it last" (e.g., seconds, minutes, hours)     unsure 4. PATTERN "Does it come and go, or has it been constant since it started?"  "Does it get worse with exertion?"   "Are you feeling it now?"     During workout 5. TAP: "Using your hand, can you tap out what you are feeling on a chair or table in front of you, so that I can hear?" (Note: not all patients can do this)       no 6. HEART RATE: "Can you tell me your heart rate?" "How many beats in 15 seconds?"  (Note: not all patients can do this)       80 at 15 seconds   86 full minute 7. RECURRENT SYMPTOM: "Have you ever had this before?" If so, ask: "When was the last time?"  and "What happened that time?"     no 8. CAUSE: "What do you think is causing the palpitations?"     No palpitations 9. CARDIAC HISTORY: "Do you have any history of heart disease?" (e.g., heart attack, angina, bypass surgery, angioplasty, arrhythmia)     HTN 10. OTHER SYMPTOMS: "Do you have any other symptoms?" (e.g., dizziness, chest pain, sweating, difficulty breathing)       no  Protocols used: HEART RATE AND HEARTBEAT QUESTIONS-A-AH

## 2019-01-31 NOTE — ED Triage Notes (Signed)
Heart racing on and off since 11-1130am

## 2019-01-31 NOTE — ED Notes (Signed)
Pt assisted to the bathroom at this time, upon arrival back to bed, pt's hr noted to be 120. However, while patient in the bed HR noted to decrease, remains in A-fib. Will continue to monitor.

## 2019-01-31 NOTE — Telephone Encounter (Signed)
lmov to schedule ED fu for RVR - New patient Evisit   Referral placed

## 2019-01-31 NOTE — ED Notes (Signed)
Pt called daughter to notify her of pending D/C. Pt states her daughter will call when she has arrived to pick her up so patient can be discharged.

## 2019-01-31 NOTE — ED Notes (Signed)
5mg  Cardizem administered by Gwynn Burly, RN. Pt hr temporarily decreased from 160-170 to 116. Pt's HR now back to 130's. Pt noted to be in A-fib. Pt tolerated well. Pt continues to be in A-fib at this time. Pt is noted to be alert and oriented at this time, skin, warm, dry, and intact.

## 2019-01-31 NOTE — ED Notes (Signed)
EDP at bedside at this time to assess patient.

## 2019-01-31 NOTE — Discharge Instructions (Signed)
Take diltiazem 120 mg CD 1 pill a day.  Stop the HCTZ for now.  Your blood pressure will not take both of the antihypertensive medications.  The diltiazem should help keep your heart rate normal.  Dr. Rockey Situ the cardiologist will give you a call in the next few days and set up a follow-up appointment.  Likely will be on the phone or online.  These return here if this happens again.

## 2019-02-01 ENCOUNTER — Telehealth: Payer: Self-pay

## 2019-02-01 NOTE — Telephone Encounter (Signed)
Virtual Visit Pre-Appointment Phone Call  Steps For Call:  1. Confirm consent - "In the setting of the current Covid19 crisis, you are scheduled for a VIDEO  visit with your provider on 02/04/2019 at 11:20AM.  Just as we do with many in-office visits, in order for you to participate in this visit, we must obtain consent.  If you'd like, I can send this to your mychart (if signed up) or email for you to review.  Otherwise, I can obtain your verbal consent now.  All virtual visits are billed to your insurance company just like a normal visit would be.  By agreeing to a virtual visit, we'd like you to understand that the technology does not allow for your provider to perform an examination, and thus may limit your provider's ability to fully assess your condition.  Finally, though the technology is pretty good, we cannot assure that it will always work on either your or our end, and in the setting of a video visit, we may have to convert it to a phone-only visit.  In either situation, we cannot ensure that we have a secure connection.  Are you willing to proceed?"  2. Give patient instructions for WebEx download to smartphone as below if video visit  3. Advise patient to be prepared with any vital sign or heart rhythm information, their current medicines, and a piece of paper and pen handy for any instructions they may receive the day of their visit  4. Inform patient they will receive a phone call 15 minutes prior to their appointment time (may be from unknown caller ID) so they should be prepared to answer  5. Confirm that appointment type is correct in Epic appointment notes (video vs telephone)    TELEPHONE CALL NOTE  Allison [name] has been deemed a candidate for a follow-up tele-health visit to limit community exposure during the Covid-19 pandemic. I spoke with the patient via phone to ensure availability of phone/video source, confirm preferred email & phone number, and discuss  instructions and expectations.  I reminded Allison [name] to be prepared with any vital sign and/or heart rhythm information that could potentially be obtained via home monitoring, at the time of her visit. I reminded Allison [name] to expect a phone call at the time of her visit if her visit.  Did the patient verbally acknowledge consent to treatment? YES  Janan Ridge, [place] 02/01/2019 8:47 AM  CONSENT FOR TELE-HEALTH VISIT - PLEASE REVIEW  I hereby voluntarily request, consent and authorize [facility] and its employed or contracted physicians, physician assistants, nurse practitioners or other licensed health care professionals (the Practitioner), to provide me with telemedicine health care services (the Services") as deemed necessary by the treating Practitioner. I acknowledge and consent to receive the Services by the Practitioner via telemedicine. I understand that the telemedicine visit will involve communicating with the Practitioner through live audiovisual communication technology and the disclosure of certain medical information by electronic transmission. I acknowledge that I have been given the opportunity to request an in-person assessment or other available alternative prior to the telemedicine visit and am voluntarily participating in the telemedicine visit.  I understand that I have the right to withhold or withdraw my consent to the use of telemedicine in the course of my care at any time, without affecting my right to future care or treatment, and that the Practitioner or I may terminate the telemedicine visit at any time. I understand that I  have the right to inspect all information obtained and/or recorded in the course of the telemedicine visit and may receive copies of available information for a reasonable fee.  I understand that some of the potential risks of receiving the Services via telemedicine include:   Delay or interruption in medical evaluation due to  technological equipment failure or disruption;  Information transmitted may not be sufficient (e.g. poor resolution of images) to allow for appropriate medical decision making by the Practitioner; and/or   In rare instances, security protocols could fail, causing a breach of personal health information.  Furthermore, I acknowledge that it is my responsibility to provide information about my medical history, conditions and care that is complete and accurate to the best of my ability. I acknowledge that Practitioner's advice, recommendations, and/or decision may be based on factors not within their control, such as incomplete or inaccurate data provided by me or distortions of diagnostic images or specimens that may result from electronic transmissions. I understand that the practice of medicine is not an exact science and that Practitioner makes no warranties or guarantees regarding treatment outcomes. I acknowledge that I will receive a copy of this consent concurrently upon execution via email to the email address I last provided but may also request a printed copy by calling the office of [facility].    I understand that my insurance will be billed for this visit.   I have read or had this consent read to me.  I understand the contents of this consent, which adequately explains the benefits and risks of the Services being provided via telemedicine.   I have been provided ample opportunity to ask questions regarding this consent and the Services and have had my questions answered to my satisfaction.  I give my informed consent for the services to be provided through the use of telemedicine in my medical care  By participating in this telemedicine visit I agree to the above.

## 2019-02-01 NOTE — Telephone Encounter (Signed)
Spoke with patient.  Patient was agreeable to a video visit.  Made her aware of what to look for at the time of her visit.  Will place consent in a separate encounter.

## 2019-02-03 DIAGNOSIS — I480 Paroxysmal atrial fibrillation: Secondary | ICD-10-CM | POA: Insufficient documentation

## 2019-02-03 NOTE — Progress Notes (Addendum)
Virtual Visit via Video Note   This visit type was conducted due to national recommendations for restrictions regarding the COVID-19 Pandemic (e.g. social distancing) in an effort to limit this patient's exposure and mitigate transmission in our community.  Due to her co-morbid illnesses, this patient is at least at moderate risk for complications without adequate follow up.  This format is felt to be most appropriate for this patient at this time.  All issues noted in this document were discussed and addressed.  A limited physical exam was performed with this format.  Please refer to the patient's chart for her consent to telehealth for [facility].    Date:  02/04/2019   ID:  Allison [name], DOB 08/05/1951, MRN [id number]  Patient Location:  [address] [place] [address]   Provider location:   [facility], [place] office  PCP:  Einar Pheasant, MD  Cardiologist:  Arvid Right [facility]  Chief Complaint: Paroxysmal tachycardia, atrial fibrillation  New Patient  History of Present Illness:    Allison [name] is a 68 y.o. female who presents via audio/video conferencing for a telehealth visit today.   The patient does not symptoms concerning for COVID-19 infection (fever, chills, cough, or new SHORTNESS OF BREATH).   Patient has a past medical history of  paroxysmal atrial fibrillation Essential hypertension Seen in the emergency room January 31, 2019 for Atrial fibrillation Who presents for new patient evaluation for paroxysmal atrial fibrillation  Reports that she was in her usual state of health, 01/31/2019, Was sitting, did some arm weights, went to bathroom,  Shortly after 11 Am developed tachycardia Went to the emergency room, Hospital records reviewed with the patient in detail EKG showing atrial fibrillation 190 bpm  given diltiazem IV bolus repeat dosing, improved rate down to 130 bpm Repeat EKG prior to discharge atrial fibrillation rate 98  bpm Converting to normal sinus rhythm in the emergency room as documented on EKG rate 89 bpm rare PVC -EKGs reviewed personally by myself  Discharged on Cardizem CD 120 mg daily  In follow-up today she is taking the medication,  Feels ok Blood pressure stable as detailed below Denies any further episodes of tachycardia  Lab work reviewed from the hospital showing potassium was low at 3.2 Denies any recent diarrhea She does take HCTZ but this was stopped on recent hospital evaluation Previously not on potassium supplement on a regular basis  Vitals from today BP: 128- 139/79 Pulse: 80  Concerned that she is losing some weight  Reports a good diet  EKG is reviewed personally by myself from hospital visit showing atrial fibrillation rate 190 bpm, subsequent EKG showing conversion to normal sinus rhythm, rare PVC  In terms of her family history Mother with atrial fib   Prior CV studies:   The following studies were reviewed today:    Past Medical History:  Diagnosis Date  . GERD (gastroesophageal reflux disease)   . Hypercholesterolemia   . Hypertension   . Migraine headache   . Psoriasis    Past Surgical History:  Procedure Laterality Date  . COLONOSCOPY WITH PROPOFOL N/A 12/23/2016   Procedure: COLONOSCOPY WITH PROPOFOL;  Surgeon: Lollie Sails, MD;  Location: [facility] ENDOSCOPY;  Service: Endoscopy;  Laterality: N/A;  . DILATION AND CURETTAGE OF UTERUS     history of abnormal bleeding  . TUBAL LIGATION       No outpatient medications have been marked as taking for the 02/04/19 encounter (Appointment) with Rockey Situ,  Kathlene November, MD.     Allergies:   Iodine   Social History   Tobacco Use  . Smoking status: Never Smoker  . Smokeless tobacco: Never Used  Substance Use Topics  . Alcohol use: No    Alcohol/week: 0.0 standard drinks  . Drug use: No     Current Outpatient Medications on File Prior to Visit  Medication Sig Dispense Refill  . ALPRAZolam (XANAX) 0.25  MG tablet TAKE 1 TABLET BY MOUTH ONCE DAILY AS NEEDED 30 tablet 0  . diltiazem (CARDIZEM CD) 120 MG 24 hr capsule Take 1 capsule (120 mg total) by mouth daily. 30 capsule 11  . eletriptan (RELPAX) 20 MG tablet Take 1 tablet (20 mg total) by mouth as needed. may repeat in 2 hours if necessary 10 tablet 0  . hydrochlorothiazide (MICROZIDE) 12.5 MG capsule TAKE 1 CAPSULE BY MOUTH ONCE DAILY 90 capsule 1  . hyoscyamine (LEVSIN, ANASPAZ) 0.125 MG tablet TAKE 1 TABLET BY MOUTH EVERY 6 HOURS AS NEEDED 30 tablet 0  . MELOXICAM PO Take by mouth.    Marland Kitchen omeprazole (PRILOSEC) 20 MG capsule Take 1 capsule (20 mg total) by mouth daily. 30 capsule 1  . pravastatin (PRAVACHOL) 10 MG tablet TAKE 1 TABLET BY MOUTH ONCE DAILY 90 tablet 1   No current facility-administered medications on file prior to visit.      Family Hx: The patient's family history includes Asthma in her father; Breast cancer in her maternal grandmother and paternal grandmother; CVA in her mother; Colon cancer in her maternal uncle and unknown relative; Diabetes in her mother and unknown relative; Psoriasis in her mother.  ROS:   Please see the history of present illness.    Review of Systems  Constitutional: Negative.   Respiratory: Negative.   Cardiovascular: Negative.        Tachycardia  Gastrointestinal: Negative.   Musculoskeletal: Negative.   Neurological: Negative.   Psychiatric/Behavioral: Negative.   All other systems reviewed and are negative.     Labs/Other Tests and Data Reviewed:    Recent Labs: 07/27/2018: ALT 11 01/31/2019: BUN 14; Creatinine, Ser 0.76; Hemoglobin 15.4; Platelets 285; Potassium 3.2; Sodium 141; TSH 1.149   Recent Lipid Panel Lab Results  Component Value Date/Time   CHOL 181 07/27/2018 03:52 PM   TRIG 59 07/27/2018 03:52 PM   HDL 64 07/27/2018 03:52 PM   CHOLHDL 2.8 07/27/2018 03:52 PM   LDLCALC 103 (H) 07/27/2018 03:52 PM    Wt Readings from Last 3 Encounters:  01/31/19 130 lb (59 kg)   07/27/18 142 lb 8 oz (64.6 kg)  01/22/18 143 lb 6.4 oz (65 kg)     Exam:    Vital Signs: Vital signs may also be detailed in the HPI There were no vitals taken for this visit.  Wt Readings from Last 3 Encounters:  01/31/19 130 lb (59 kg)  07/27/18 142 lb 8 oz (64.6 kg)  01/22/18 143 lb 6.4 oz (65 kg)   Temp Readings from Last 3 Encounters:  01/31/19 98.6 F (37 C) (Oral)  07/27/18 97.6 F (36.4 C) (Oral)  01/22/18 97.8 F (36.6 C) (Oral)   BP Readings from Last 3 Encounters:  01/31/19 105/75  07/27/18 120/70  01/22/18 130/82   Pulse Readings from Last 3 Encounters:  01/31/19 73  07/27/18 67  01/22/18 72     Vital BP: 128- 139/79, lowest 485 systolic Pulse: 70 to 63  Well nourished, well developed female in no acute distress. Constitutional:  oriented  to person, place, and time. No distress.  Head: Normocephalic and atraumatic.  Eyes:  no discharge. No scleral icterus.  Neck: Normal range of motion. Neck supple.  Pulmonary/Chest: No audible wheezing, no distress, appears comfortable Musculoskeletal: Normal range of motion.  no  tenderness or deformity.  Neurological:   Coordination normal. Full exam not performed Skin:  No rash Psychiatric:  normal mood and affect. behavior is normal. Thought content normal.    ASSESSMENT & PLAN:    Paroxysmal atrial fibrillation ([place]) - Plan: ECHOCARDIOGRAM COMPLETE Lone atrial fibrillation this past week Etiology unclear, possibly exacerbated by low potassium from HCTZ She is now no longer taking HCTZ She does report family history of atrial fibrillation She is tolerating diltiazem CD 120 mg daily We have recommended that she stay on the diltiazem for now and we have sent in a prescription for diltiazem 30 mg pills that she can take as needed for breakthrough tachycardia -Suggested monitoring heart rate closely at home.  If there is concern of additional arrhythmia or paroxysmal tachycardia recommend she call our office for  a Zio monitor -We did talk about anticoagulation options.  Given single event/lone atrial fibrillation, no prior history of TIA or strokes, recommend we monitor for now  Essential hypertension Blood pressure running somewhat low at times Recommend she continue to monitor blood pressure and call our office if this runs low  Hypercholesterolemia LDL around 100 No prior history of coronary disease   COVID-19 Education: The signs and symptoms of COVID-19 were discussed with the patient and how to seek care for testing (follow up with PCP or arrange E-visit).  The importance of social distancing was discussed today.  Patient Risk:   After full review of this patients clinical status, I feel that they are at least moderate risk at this time.  Time:   Today, I have spent 45 minutes with the patient with telehealth technology discussing the cardiac and medical problems/diagnoses detailed above     Medication Adjustments/Labs and Tests Ordered: Current medicines are reviewed at length with the patient today.  Concerns regarding medicines are outlined above.   Tests Ordered: Echocardiogram in 3 months time   Medication Changes: No changes made   Disposition: Follow-up in 3 months   Signed, Ida Rogue, MD  02/04/2019 11:46 AM    [place] Office [address], [place], [place] [address]

## 2019-02-04 ENCOUNTER — Ambulatory Visit (INDEPENDENT_AMBULATORY_CARE_PROVIDER_SITE_OTHER): Payer: Medicare Other | Admitting: Internal Medicine

## 2019-02-04 ENCOUNTER — Telehealth (INDEPENDENT_AMBULATORY_CARE_PROVIDER_SITE_OTHER): Payer: Medicare Other | Admitting: Cardiovascular Disease

## 2019-02-04 ENCOUNTER — Telehealth: Payer: Self-pay | Admitting: Cardiovascular Disease

## 2019-02-04 ENCOUNTER — Telehealth: Payer: Self-pay

## 2019-02-04 ENCOUNTER — Other Ambulatory Visit: Payer: Self-pay

## 2019-02-04 ENCOUNTER — Encounter: Payer: Self-pay | Admitting: Internal Medicine

## 2019-02-04 DIAGNOSIS — K219 Gastro-esophageal reflux disease without esophagitis: Secondary | ICD-10-CM

## 2019-02-04 DIAGNOSIS — I480 Paroxysmal atrial fibrillation: Secondary | ICD-10-CM

## 2019-02-04 DIAGNOSIS — I10 Essential (primary) hypertension: Secondary | ICD-10-CM

## 2019-02-04 DIAGNOSIS — R634 Abnormal weight loss: Secondary | ICD-10-CM | POA: Diagnosis not present

## 2019-02-04 DIAGNOSIS — E7800 Pure hypercholesterolemia, unspecified: Secondary | ICD-10-CM

## 2019-02-04 MED ORDER — DILTIAZEM HCL 30 MG PO TABS: 30.0000 mg | ORAL_TABLET | Freq: Three times a day (TID) | ORAL | 3 refills | Status: DC | PRN

## 2019-02-04 NOTE — Progress Notes (Addendum)
Patient ID: Allison [name], female   DOB: 02/01/1951, 68 y.o.   MRN: [id number] Virtual Visit via Video: Note  This visit type was conducted due to national recommendations for restrictions regarding the COVID-19 pandemic (e.g. social distancing).  This format is felt to be most appropriate for this patient at this time.  All issues noted in this document were discussed and addressed.  No physical exam was performed (except for noted visual exam findings with Video Visits).   I connected with [name] on 02/04/19 at  3:00 PM EDT by a video enabled telemedicine application and verified that I am speaking with the correct person using two identifiers. Location patient: home Location provider: work  Persons participating in the virtual visit: patient, provider  I discussed the limitations, risks, security and privacy concerns of performing an evaluation and management service by video and the availability of in person appointments.  The patient expressed understanding and agreed to proceed.   Reason for visit:  Scheduled follow up and ER follow up.  HPI: Was recently evaluated in ER for increased heart rate.  ER note and Dr Donivan Scull note reviewed.  Was found to be in afib.  Given IV diltiazem bolus.  Hear rate improved.  Subsequently converted to NSR.  Discharged on cardizem 120mg  q day.  HCTZ was stopped.  States since her discharge, she has done well.  No chest pain.  No increased heart rate or palpitations.  No sob.  Trying to stay active, but having to stay in with COVID concerns.  Denies any fever, sob, cough or congestion.  Saw Dr Rockey Situ earlier today.  Note reviewed.  Tolerating cardizem 120mg  q day. rx given for 30mg  diltiazem to have if needed.  No other changes made.  Per note, holding on anticoagulation given single event/lone afib.  Blood pressure 117/74.  Other pressures reviewed via Dr Donivan Scull note.  She is eating.  Has a good appetite. States her weight is 130 pounds - some  decrease.  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  Is retired now.  Eating more regular.  Overall she feels she is doing well.  Mother passed away recently.  Overall coping relatively well.     ROS: See pertinent positives and negatives per HPI.  Past Medical History:  Diagnosis Date  . GERD (gastroesophageal reflux disease)   . Hypercholesterolemia   . Hypertension   . Migraine headache   . Psoriasis     Past Surgical History:  Procedure Laterality Date  . COLONOSCOPY WITH PROPOFOL N/A 12/23/2016   Procedure: COLONOSCOPY WITH PROPOFOL;  Surgeon: Lollie Sails, MD;  Location: [facility] ENDOSCOPY;  Service: Endoscopy;  Laterality: N/A;  . DILATION AND CURETTAGE OF UTERUS     history of abnormal bleeding  . TUBAL LIGATION      Family History  Problem Relation Age of Onset  . Asthma Father   . Diabetes Mother   . CVA Mother   . Psoriasis Mother   . Breast cancer Maternal Grandmother   . Breast cancer Paternal Grandmother   . Colon cancer Maternal Uncle   . Colon cancer Other        nephew  . Diabetes Other        multiple relatives (both sides)    SOCIAL HX: reviewed.    Current Outpatient Medications:  .  ALPRAZolam (XANAX) 0.25 MG tablet, TAKE 1 TABLET BY MOUTH ONCE  DAILY AS NEEDED, Disp: 30 tablet, Rfl: 0 .  diltiazem (CARDIZEM CD) 120 MG 24 hr capsule, Take 1 capsule (120 mg total) by mouth daily., Disp: 30 capsule, Rfl: 11 .  diltiazem (CARDIZEM) 30 MG tablet, Take 1 tablet (30 mg total) by mouth 3 (three) times daily as needed (As needed for tachycardia/atrial fib)., Disp: 90 tablet, Rfl: 3 .  eletriptan (RELPAX) 20 MG tablet, Take 1 tablet (20 mg total) by mouth as needed. may repeat in 2 hours if necessary, Disp: 10 tablet, Rfl: 0 .  hyoscyamine (LEVSIN, ANASPAZ) 0.125 MG tablet, TAKE 1 TABLET BY MOUTH EVERY 6 HOURS AS NEEDED, Disp: 30 tablet, Rfl: 0 .  omeprazole (PRILOSEC)  20 MG capsule, Take 1 capsule (20 mg total) by mouth daily., Disp: 30 capsule, Rfl: 1 .  pravastatin (PRAVACHOL) 10 MG tablet, TAKE 1 TABLET BY MOUTH ONCE DAILY, Disp: 90 tablet, Rfl: 1  EXAM:  VITALS per patient if applicable:  119/41 (blood pressure taken by pt).    GENERAL: alert, oriented, appears well and in no acute distress  HEENT: atraumatic, conjunttiva clear, no obvious abnormalities on inspection of external nose and ears  NECK: normal movements of the head and neck  LUNGS: on inspection no signs of respiratory distress, breathing rate appears normal, no obvious gross SOB, gasping or wheezing  CV: no obvious cyanosis  PSYCH/NEURO: pleasant and cooperative, no obvious depression or anxiety, speech and thought processing grossly intact  ASSESSMENT AND PLAN:  Discussed the following assessment and plan:  Gastroesophageal reflux disease, esophagitis presence not specified  Hypercholesterolemia  Essential hypertension  Paroxysmal atrial fibrillation (HCC)  Weight loss  GERD (gastroesophageal reflux disease) No reported symptoms.    Hypercholesterolemia On pravastatin.  Follow lipid panel and liver function tests.    Hypertension Blood pressure as outlined.  On cardizem now. Off hctz.  Follow.    Paroxysmal atrial fibrillation (HCC) With new diagnosis of afib as outlined.  Converted back to SR in ER.  On cardizem.  Denies any increased heart rate, palpitations, etc - now.  Saw Dr Rockey Situ earlier today.  Has cardizem 30mg  to take prn.  Given lone episode, it was decided to hold on anticoagulation at this time.  Will plan for f/u with cardiology and echo, etc - in a few months.   Weight loss States weight now is 130 pounds.  Discussed with her today.  She is eating well and eating better now.  No nausea, vomiting, abdominal ain or bowel change.  Will monitor.      I discussed the assessment and treatment plan with the patient. The patient was provided an opportunity  to ask questions and all were answered. The patient agreed with the plan and demonstrated an understanding of the instructions.   The patient was advised to call back or seek an in-person evaluation if the symptoms worsen or if the condition fails to improve as anticipated.  I provided 20 minutes of non-face-to-face time during this encounter.   Einar Pheasant, MD

## 2019-02-04 NOTE — Telephone Encounter (Signed)
Patient is scheduled for a Video visit 02/04/2019

## 2019-02-04 NOTE — Telephone Encounter (Signed)
Spoke with patient and let her know that Dr. Rockey Situ would be with her shortly that he was running a little behind. She verbalized understanding with no further questions at this time.

## 2019-02-04 NOTE — Telephone Encounter (Signed)
-----   Message from Minna Merritts, MD sent at 01/31/2019  9:56 PM EDT ----- Regarding: RE: er Next week or so, Evisit with web ok Thx TG ----- Message ----- From: Janan Ridge, CMA Sent: 01/31/2019   3:52 PM EDT To: Minna Merritts, MD Subject: RE: er                                         How soon would you like patient seen? ----- Message ----- From: Minna Merritts, MD Sent: 01/31/2019   3:45 PM EDT To: Janan Ridge, CMA, # Subject: er                                             Seen in the emergency room January 31, 2019 for atrial fibrillation with RVR rate 190 Given medications in the ER and broke to normal sinus rhythm Needs cardiology follow-up with web visit Thx TG

## 2019-02-04 NOTE — Patient Instructions (Addendum)
Medication Instructions:  Your physician has recommended you make the following change in your medication:  1. TAKE Ditltizem 30 mg Three times daily as needed for tachycardia/atrial fib  If you need a refill on your cardiac medications before your next appointment, please call your pharmacy.    Lab work: No new labs needed   If you have labs (blood work) drawn today and your tests are completely normal, you will receive your results only by: Marland Kitchen MyChart Message (if you have MyChart) OR . A paper copy in the mail If you have any lab test that is abnormal or we need to change your treatment, we will call you to review the results.   Testing/Procedures: Echocardiogram in 3 months   Follow-Up: At [facility], you and your health needs are our priority.  As part of our continuing mission to provide you with exceptional heart care, we have created designated Provider Care Teams.  These Care Teams include your primary Cardiologist (physician) and Advanced Practice Providers (APPs -  Physician Assistants and Nurse Practitioners) who all work together to provide you with the care you need, when you need it.  . You will need a follow up appointment in 3 months after echo  . Providers on your designated Care Team:   . Murray Hodgkins, NP . Christell Faith, PA- . Marrianne Mood, PA-C  Any Other Special Instructions Will Be Listed Below (If Applicable).  For educational health videos Log in to : [email or web address] Or : [email or web address], password : [email or web address]

## 2019-02-04 NOTE — Progress Notes (Signed)
LMOV to schedule 3 month ECHO

## 2019-02-07 ENCOUNTER — Encounter: Payer: Self-pay | Admitting: Internal Medicine

## 2019-02-07 NOTE — Assessment & Plan Note (Signed)
With new diagnosis of afib as outlined.  Converted back to SR in ER.  On cardizem.  Denies any increased heart rate, palpitations, etc - now.  Saw Dr Rockey Situ earlier today.  Has cardizem 30mg  to take prn.  Given lone episode, it was decided to hold on anticoagulation at this time.  Will plan for f/u with cardiology and echo, etc - in a few months.

## 2019-02-07 NOTE — Assessment & Plan Note (Signed)
On pravastatin.  Follow lipid panel and liver function tests.   

## 2019-02-07 NOTE — Assessment & Plan Note (Signed)
No reported symptoms.

## 2019-02-07 NOTE — Assessment & Plan Note (Signed)
Blood pressure as outlined.  On cardizem now. Off hctz.  Follow.

## 2019-02-07 NOTE — Assessment & Plan Note (Signed)
States weight now is 130 pounds.  Discussed with her today.  She is eating well and eating better now.  No nausea, vomiting, abdominal ain or bowel change.  Will monitor.

## 2019-02-11 ENCOUNTER — Telehealth: Payer: Self-pay | Admitting: Cardiovascular Disease

## 2019-02-11 NOTE — Telephone Encounter (Signed)
  Pt c/o medication issue:  1. Name of Medication: diltiazem (CARDIZEM) 30 MG tablet  2. How are you currently taking this medication (dosage and times per day)? As directed  3. Are you having a reaction (difficulty breathing--STAT)?  No  4. What is your medication issue? What can she take for her arthritis? Ibuprofen or Tylenol?

## 2019-02-11 NOTE — Telephone Encounter (Signed)
Spoke with the pt. Adv her to avoid NSAIDs ok to use Tylenol prn as needed for pain. Adv her to follow the instruction on dosing on the bottle. Pt verbalized understanding anfd voiced appreciation for the call back.

## 2019-02-12 ENCOUNTER — Telehealth: Payer: Self-pay

## 2019-02-12 NOTE — Telephone Encounter (Signed)
Copied from Melissa [phone]. Topic: General - Other >> Feb 12, 2019 10:58 AM Carolyn Stare wrote:  Pt lvm on [facility] appt line requesting a 3 month fup in July

## 2019-02-16 ENCOUNTER — Other Ambulatory Visit: Payer: Self-pay

## 2019-02-16 ENCOUNTER — Emergency Department
Admission: EM | Admit: 2019-02-16 | Discharge: 2019-02-16 | Disposition: A | Discharge status: Home / Self Care | Payer: Medicare Other | Attending: Emergency Medicine | Admitting: Emergency Medicine

## 2019-02-16 DIAGNOSIS — Z79899 Other long term (current) drug therapy: Secondary | ICD-10-CM | POA: Diagnosis not present

## 2019-02-16 DIAGNOSIS — R000 Tachycardia, unspecified: Secondary | ICD-10-CM | POA: Diagnosis present

## 2019-02-16 DIAGNOSIS — I480 Paroxysmal atrial fibrillation: Secondary | ICD-10-CM

## 2019-02-16 DIAGNOSIS — I10 Essential (primary) hypertension: Secondary | ICD-10-CM | POA: Insufficient documentation

## 2019-02-16 LAB — BASIC METABOLIC PANEL
Anion gap: 11 (ref 5–15)
BUN: 15 mg/dL (ref 8–23)
CO2: 26 mmol/L (ref 22–32)
Calcium: 9.3 mg/dL (ref 8.9–10.3)
Chloride: 103 mmol/L (ref 98–111)
Creatinine, Ser: 0.52 mg/dL (ref 0.44–1.00)
GFR calc Af Amer: >60 mL/min (ref >60)
GFR calc non Af Amer: >60 mL/min (ref >60)
Glucose, Bld: 115 mg/dL — ABNORMAL HIGH (ref 70–99)
Potassium: 3.9 mmol/L (ref 3.5–5.1)
Sodium: 140 mmol/L (ref 135–145)

## 2019-02-16 LAB — CBC
HCT: 40.7 % (ref 36.0–46.0)
Hemoglobin: 13.8 g/dL (ref 12.0–15.0)
MCH: 31.3 pg (ref 26.0–34.0)
MCHC: 33.9 g/dL (ref 30.0–36.0)
MCV: 92.3 fL (ref 80.0–100.0)
Platelets: 247 10*3/uL (ref 150–400)
RBC: 4.41 MIL/uL (ref 3.87–5.11)
RDW: 12.4 % (ref 11.5–15.5)
WBC: 6 10*3/uL (ref 4.0–10.5)
nRBC: 0 % (ref 0.0–0.2)

## 2019-02-16 NOTE — ED Triage Notes (Signed)
Pt states has a history of a fib . Pt states was at rest around 1900 when she felt like her heart was racing. Pt states "I took an extra white pill like he told me too when I felt it." pt denies other symptoms. Skin pwd.

## 2019-02-16 NOTE — ED Provider Notes (Signed)
[facility] Emergency Department Provider Note   ____________________________________________    I have reviewed the triage vital signs and the nursing notes.   HISTORY  Chief Complaint Tachycardia     HPI Allison [name] is a 68 y.o. female who presents after having an episode of palpitations.  Patient reports that approximately 715 she started having heart palpitations.  She does have a history of paroxysmal atrial fibrillation.  She does take long-acting Cardizem 120 mg daily but also has 30 mg short acting Cardizem which she was instructed to take if she develops palpitations by her cardiologist.  She did this today.  By the time she got to the emergency department her palpitations had improved.  She had no chest pain no shortness of breath.  No fevers chills nausea vomiting or diaphoresis.  Currently she feels quite well and has no complaints.  Past Medical History:  Diagnosis Date  . GERD (gastroesophageal reflux disease)   . Hypercholesterolemia   . Hypertension   . Migraine headache   . Psoriasis     Patient Active Problem List   Diagnosis Date Noted  . Paroxysmal atrial fibrillation ([place]) 02/03/2019  . Loose stools 07/23/2017  . Weight loss 07/23/2017  . 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 LLQ abdominal pain 09/14/2016  . Diverticulosis 04/18/2016  . Rib pain on right side 02/17/2016  . Right shoulder pain 01/24/2016  . Health care maintenance 05/04/2015  . Leg cramps 05/01/2015  . Unspecified constipation 07/13/2014  . Eye irritation 11/14/2013  . Hypertension 10/15/2012  . Hypercholesterolemia 10/15/2012  . GERD (gastroesophageal reflux disease) 10/15/2012  . Migraine headache 10/15/2012    Past Surgical History:  Procedure Laterality Date  . COLONOSCOPY WITH PROPOFOL N/A 12/23/2016   Procedure: COLONOSCOPY WITH PROPOFOL;  Surgeon: Lollie Sails, MD;  Location: [facility] ENDOSCOPY;  Service: Endoscopy;  Laterality: N/A;  . DILATION  AND CURETTAGE OF UTERUS     history of abnormal bleeding  . TUBAL LIGATION      Prior to Admission medications   Medication Sig Start Date End Date Taking? Authorizing Provider  ALPRAZolam Duanne Moron) 0.25 MG tablet TAKE 1 TABLET BY MOUTH ONCE DAILY AS NEEDED 01/11/19   Einar Pheasant, MD  diltiazem (CARDIZEM CD) 120 MG 24 hr capsule Take 1 capsule (120 mg total) by mouth daily. 01/31/19 01/31/20  Nena Polio, MD  diltiazem (CARDIZEM) 30 MG tablet Take 1 tablet (30 mg total) by mouth 3 (three) times daily as needed (As needed for tachycardia/atrial fib). 02/04/19   Minna Merritts, MD  eletriptan (RELPAX) 20 MG tablet Take 1 tablet (20 mg total) by mouth as needed. may repeat in 2 hours if necessary 01/22/16   Einar Pheasant, MD  hyoscyamine (LEVSIN, ANASPAZ) 0.125 MG tablet TAKE 1 TABLET BY MOUTH EVERY 6 HOURS AS NEEDED 12/31/18   Einar Pheasant, MD  omeprazole (PRILOSEC) 20 MG capsule Take 1 capsule (20 mg total) by mouth daily. 07/31/18   Einar Pheasant, MD  pravastatin (PRAVACHOL) 10 MG tablet TAKE 1 TABLET BY MOUTH ONCE DAILY 09/14/18   Einar Pheasant, MD     Allergies Iodine  Family History  Problem Relation Age of Onset  . Asthma Father   . Diabetes Mother   . CVA Mother   . Psoriasis Mother   . Breast cancer Maternal Grandmother   . Breast cancer Paternal Grandmother   . Colon cancer Maternal Uncle   . Colon cancer Other  nephew  . Diabetes Other        multiple relatives (both sides)    Social History Social History   Tobacco Use  . Smoking status: Never Smoker  . Smokeless tobacco: Never Used  Substance Use Topics  . Alcohol use: No    Alcohol/week: 0.0 standard drinks  . Drug use: No    Review of Systems  Constitutional: No fever/chills Eyes: No visual changes.  ENT: No sore throat. Cardiovascular: As above Respiratory: Denies shortness of breath. Gastrointestinal: No abdominal pain.   Genitourinary: Negative for dysuria. Musculoskeletal: Negative  for back pain. Skin: Negative for rash. Neurological: Negative for headaches or weakness   ____________________________________________   PHYSICAL EXAM:  VITAL SIGNS: ED Triage Vitals  Enc Vitals Group     BP 02/16/19 1951 127/84     Pulse Rate 02/16/19 1951 75     Resp 02/16/19 1951 16     Temp 02/16/19 1951 98.2 F (36.8 C)     Temp Source 02/16/19 1951 Oral     SpO2 02/16/19 1951 98 %     Weight 02/16/19 1955 59 kg (130 lb)     Height 02/16/19 1955 1.727 m (5\' 8" )     Head Circumference --      Peak Flow --      Pain Score 02/16/19 1955 0     Pain Loc --      Pain Edu? --      Excl. in [place]? --     Constitutional: Alert and oriented. No acute distress. Eyes: Conjunctivae are normal.  .Nose: No congestion/rhinnorhea. Mouth/Throat: Mucous membranes are moist.    Cardiovascular: Normal rate, regular rhythm.  Good peripheral circulation. Respiratory: Normal respiratory effort.  No retractions. Lungs CTAB. Gastrointestinal: Soft and nontender. No distention.  Musculoskeletal:   Warm and well perfused Neurologic:  Normal speech and language. No gross focal neurologic deficits are appreciated.  Skin:  Skin is warm, dry and intact. No rash noted. Psychiatric: Mood and affect are normal. Speech and behavior are normal.  ____________________________________________   LABS (all labs ordered are listed, but only abnormal results are displayed)  Labs Reviewed  BASIC METABOLIC PANEL - Abnormal; Notable for the following components:      Result Value   Glucose, Bld 115 (*)    All other components within normal limits  CBC   ____________________________________________  EKG  ED ECG REPORT I, Lavonia Drafts, the attending physician, personally viewed and interpreted this ECG.  Date: 02/16/2019  Rhythm: normal sinus rhythm QRS Axis: normal Intervals: normal ST/T Wave abnormalities: normal Narrative Interpretation: no evidence of acute ischemia   ____________________________________________  RADIOLOGY  None ____________________________________________   PROCEDURES  Procedure(s) performed: No  Procedures   Critical Care performed: No ____________________________________________   INITIAL IMPRESSION / ASSESSMENT AND PLAN / ED COURSE  Pertinent labs & imaging results that were available during my care of the patient were reviewed by me and considered in my medical decision making (see chart for details).  Patient well-appearing in no acute distress.  Seems quite likely that the patient had an episode of atrial fibrillation with RVR given her history.  She did the right thing by taking her 30 mg of Cardizem which seems to have resolved her atrial fibrillation as she is in normal sinus rhythm right now.  She does have close follow-up with cardiology who was actually discussed that she may need to wear a Holter monitor.  EKG here is quite reassuring.  Lab work unremarkable,  potassium is normalized.  Appropriate discharge at this time with outpatient follow-up.    ____________________________________________   FINAL CLINICAL IMPRESSION(S) / ED DIAGNOSES  Final diagnoses:  Paroxysmal atrial fibrillation ([place])        Note:  This document was prepared using Dragon voice recognition software and may include unintentional dictation errors.   Lavonia Drafts, MD 02/16/19 2115

## 2019-02-16 NOTE — ED Notes (Signed)
Pt verbalized understanding of discharge instructions. NAD at this time. 

## 2019-02-21 NOTE — Progress Notes (Signed)
Virtual Visit via Video Note   This visit type was conducted due to national recommendations for restrictions regarding the COVID-19 Pandemic (e.g. social distancing) in an effort to limit this patient's exposure and mitigate transmission in our community.  Due to her co-morbid illnesses, this patient is at least at moderate risk for complications without adequate follow up.  This format is felt to be most appropriate for this patient at this time.  All issues noted in this document were discussed and addressed.  A limited physical exam was performed with this format.  Please refer to the patient's chart for her consent to telehealth for [facility].    Date:  02/21/2019   ID:  [name], DOB 17-Oct-1951, MRN [id number]  Patient Location:  [address] [place] [address]   Provider location:   [facility], [place] office  PCP:  Einar Pheasant, MD  Cardiologist:  [facility]  Chief Complaint: Paroxysmal tachycardia, atrial fibrillation   History of Present Illness:    NADIE FIUMARA is a 68 y.o. female who presents via audio/video conferencing for a telehealth visit today.   The patient does not symptoms concerning for COVID-19 infection (fever, chills, cough, or new SHORTNESS OF BREATH).   Patient has a past medical history of  paroxysmal atrial fibrillation Essential hypertension Seen in the emergency room January 31, 2019 for Atrial fibrillation Who presents for new patient evaluation for paroxysmal atrial fibrillation  On follow-up today reports having additional episode of atrial fibrillation February 16, 2019 Started at 7pm, racing, took extra diltiazem Echocardiogram has been ordered but not completed She went to the emergency room, palpitations had resolved EKG showed normal sinus rhythm   Back to baseline No SOB, no edema  Mother with a CVA, age 63 Brother with TIA/CVA Strong family hx of atrial fib  145/80, before pill  126/69 Pulse 70 resp 16  Other PMH Atrial fib 01/31/2019, 11 Am developed tachycardia Went to the emergency room, EKG showing atrial fibrillation 190 bpm  given diltiazem IV bolus repeat dosing, improved rate down to 130 bpm Discharged on Cardizem CD 120 mg daily  potassium was low at 3.2, on HCTZ   Prior CV studies:   The following studies were reviewed today:    Past Medical History:  Diagnosis Date  . GERD (gastroesophageal reflux disease)   . Hypercholesterolemia   . Hypertension   . Migraine headache   . Psoriasis    Past Surgical History:  Procedure Laterality Date  . COLONOSCOPY WITH PROPOFOL N/A 12/23/2016   Procedure: COLONOSCOPY WITH PROPOFOL;  Surgeon: Lollie Sails, MD;  Location: [facility] ENDOSCOPY;  Service: Endoscopy;  Laterality: N/A;  . DILATION AND CURETTAGE OF UTERUS     history of abnormal bleeding  . TUBAL LIGATION       No outpatient medications have been marked as taking for the 02/22/19 encounter (Appointment) with Minna Merritts, MD.     Allergies:   Iodine   Social History   Tobacco Use  . Smoking status: Never Smoker  . Smokeless tobacco: Never Used  Substance Use Topics  . Alcohol use: No    Alcohol/week: 0.0 standard drinks  . Drug use: No     Current Outpatient Medications on File Prior to Visit  Medication Sig Dispense Refill  . ALPRAZolam (XANAX) 0.25 MG tablet TAKE 1 TABLET BY MOUTH ONCE DAILY AS NEEDED 30 tablet 0  . diltiazem (CARDIZEM CD) 120 MG 24 hr capsule Take  1 capsule (120 mg total) by mouth daily. 30 capsule 11  . diltiazem (CARDIZEM) 30 MG tablet Take 1 tablet (30 mg total) by mouth 3 (three) times daily as needed (As needed for tachycardia/atrial fib). 90 tablet 3  . eletriptan (RELPAX) 20 MG tablet Take 1 tablet (20 mg total) by mouth as needed. may repeat in 2 hours if necessary 10 tablet 0  . hyoscyamine (LEVSIN, ANASPAZ) 0.125 MG tablet TAKE 1 TABLET BY MOUTH EVERY 6 HOURS AS NEEDED 30 tablet 0  . omeprazole  (PRILOSEC) 20 MG capsule Take 1 capsule (20 mg total) by mouth daily. 30 capsule 1  . pravastatin (PRAVACHOL) 10 MG tablet TAKE 1 TABLET BY MOUTH ONCE DAILY 90 tablet 1   No current facility-administered medications on file prior to visit.      Family Hx: The patient's family history includes Asthma in her father; Breast cancer in her maternal grandmother and paternal grandmother; CVA in her mother; Colon cancer in her maternal uncle and another family member; Diabetes in her mother and another family member; Psoriasis in her mother.  ROS:   Please see the history of present illness.    Review of Systems  Constitutional: Negative.   Respiratory: Negative.   Cardiovascular: Negative.        Tachycardia  Gastrointestinal: Negative.   Musculoskeletal: Negative.   Neurological: Negative.   Psychiatric/Behavioral: Negative.   All other systems reviewed and are negative.    Labs/Other Tests and Data Reviewed:    Recent Labs: 07/27/2018: ALT 11 01/31/2019: TSH 1.149 02/16/2019: BUN 15; Creatinine, Ser 0.52; Hemoglobin 13.8; Platelets 247; Potassium 3.9; Sodium 140   Recent Lipid Panel Lab Results  Component Value Date/Time   CHOL 181 07/27/2018 03:52 PM   TRIG 59 07/27/2018 03:52 PM   HDL 64 07/27/2018 03:52 PM   CHOLHDL 2.8 07/27/2018 03:52 PM   LDLCALC 103 (H) 07/27/2018 03:52 PM    Wt Readings from Last 3 Encounters:  02/16/19 130 lb (59 kg)  01/31/19 130 lb (59 kg)  07/27/18 142 lb 8 oz (64.6 kg)     Exam:    Vital Signs: Vital signs may also be detailed in the HPI There were no vitals taken for this visit.  Wt Readings from Last 3 Encounters:  02/16/19 130 lb (59 kg)  01/31/19 130 lb (59 kg)  07/27/18 142 lb 8 oz (64.6 kg)   Temp Readings from Last 3 Encounters:  02/16/19 98.2 F (36.8 C) (Oral)  01/31/19 98.6 F (37 C) (Oral)  07/27/18 97.6 F (36.4 C) (Oral)   BP Readings from Last 3 Encounters:  02/16/19 121/75  01/31/19 105/75  07/27/18 120/70   Pulse  Readings from Last 3 Encounters:  02/16/19 (!) 59  01/31/19 73  07/27/18 67     Vital 145/80, before pill 126/69 Pulse 70 resp 16  Well nourished, well developed female in no acute distress. Constitutional:  oriented to person, place, and time. No distress.  Head: Normocephalic and atraumatic.  Eyes:  no discharge. No scleral icterus.  Neck: Normal range of motion. Neck supple.  Pulmonary/Chest: No audible wheezing, no distress, appears comfortable Musculoskeletal: Normal range of motion.  no  tenderness or deformity.  Neurological:   Coordination normal. Full exam not performed Skin:  No rash Psychiatric:  normal mood and affect. behavior is normal. Thought content normal.    ASSESSMENT & PLAN:    Paroxysmal atrial fibrillation (HCC) -   diltiazem CD 120 mg daily Echo pending [facility] VASC  2, will start xarelto 20 mg daily In an effort to suppress her arrhythmia, we will start metoprolol succinate 25 with titration up to 50 mg if tolerated, asked that she take this at dinnertime Echocardiogram pending, we do not have ejection fraction, unable to exclude cardiomyopathy or structural heart disease -Hesitant to start antiarrhythmics without echocardiogram data -Long discussion concerning significant family history of atrial fibrillation and strokes She will continue to take diltiazem 30 mg pills as needed for breakthrough arrhythmia Suggested she does not need to rush to the emergency room as in general she is relatively stable  Essential hypertension Continue diltiazem extended release 120 mg in the morning Metoprolol succinate 25 up to 50 mg in the evening for rate and rhythm control  Hypercholesterolemia LDL around 100 No prior history of coronary disease Stable   COVID-19 Education: The signs and symptoms of COVID-19 were discussed with the patient and how to seek care for testing (follow up with PCP or arrange E-visit).  The importance of social distancing was  discussed today.  Patient Risk:   After full review of this patients clinical status, I feel that they are at least moderate risk at this time.  Time:   Today, I have spent 25 minutes with the patient with telehealth technology discussing the cardiac and medical problems/diagnoses detailed above     Medication Adjustments/Labs and Tests Ordered: Current medicines are reviewed at length with the patient today.  Concerns regarding medicines are outlined above.   Tests Ordered: Echocardiogram in July   Medication Changes: No changes made   Disposition: Follow-up in 3 months after echocardiogram   Signed, Ida Rogue, MD  02/21/2019 1:16 PM    [place] [facility] [address] [place] [address], [place], [place] [address]

## 2019-02-22 ENCOUNTER — Telehealth (INDEPENDENT_AMBULATORY_CARE_PROVIDER_SITE_OTHER): Payer: Medicare Other | Admitting: Cardiovascular Disease

## 2019-02-22 ENCOUNTER — Other Ambulatory Visit: Payer: Self-pay

## 2019-02-22 DIAGNOSIS — R634 Abnormal weight loss: Secondary | ICD-10-CM | POA: Diagnosis not present

## 2019-02-22 DIAGNOSIS — E7800 Pure hypercholesterolemia, unspecified: Secondary | ICD-10-CM | POA: Diagnosis not present

## 2019-02-22 DIAGNOSIS — I10 Essential (primary) hypertension: Secondary | ICD-10-CM | POA: Diagnosis not present

## 2019-02-22 DIAGNOSIS — I480 Paroxysmal atrial fibrillation: Secondary | ICD-10-CM | POA: Diagnosis not present

## 2019-02-22 DIAGNOSIS — K219 Gastro-esophageal reflux disease without esophagitis: Secondary | ICD-10-CM

## 2019-02-22 MED ORDER — RIVAROXABAN 20 MG PO TABS: 20.0000 mg | ORAL_TABLET | Freq: Every day | ORAL | 11 refills | Status: DC

## 2019-02-22 MED ORDER — METOPROLOL SUCCINATE ER 50 MG PO TB24: 50.0000 mg | ORAL_TABLET | Freq: Every day | ORAL | 3 refills | Status: DC

## 2019-02-22 NOTE — Progress Notes (Signed)
Spoke with patient and reviewed that I sent her a link to her email to sign up for mychart. She confirmed receipt of that email link and is hoping to get it set up today. Reviewed all instructions with her and confirmed upcoming appointment for echocardiogram. Also instructed her to please call when and if she wants to wear a zio. She was appreciative for the call with no further questions at this time.

## 2019-02-22 NOTE — Patient Instructions (Addendum)
Mychart link emailed to patient to assist with her setting that up.  Medication Instructions:  Your physician has recommended you make the following change in your medication:  1. START Xarelto 20 mg daily for your atrial fibrillation 2. START Metoprolol succinate 50 mg once daily at dinner 3. STAY on Diltiazem 120 mg in the morning.   If you need a refill on your cardiac medications before your next appointment, please call your pharmacy.    Lab work: No new labs needed   If you have labs (blood work) drawn today and your tests are completely normal, you will receive your results only by: Marland Kitchen MyChart Message (if you have MyChart) OR . A paper copy in the mail If you have any lab test that is abnormal or we need to change your treatment, we will call you to review the results.   Testing/Procedures:  She will call if she would like a ZIO monitor in the future, for atrial fib  Your physician has recommended that you wear a Zio monitor. This monitor is a medical device that records the heart's electrical activity. Doctors most often use these monitors to diagnose arrhythmias. Arrhythmias are problems with the speed or rhythm of the heartbeat. The monitor is a small device applied to your chest. You can wear one while you do your normal daily activities. While wearing this monitor if you have any symptoms to push the button and record what you felt. Once you have worn this monitor for the period of time provider prescribed (Usually 14 days), you will return the monitor device in the postage paid box. Once it is returned they will download the data collected and provide [place] with a report which the provider will then review and we will call you with those results. Important tips:  1. Avoid showering during the first 24 hours of wearing the monitor. 2. Avoid excessive sweating to help maximize wear time. 3. Do not submerge the device, no hot tubs, and no swimming pools. 4. Keep any lotions or  oils away from the patch. 5. After 24 hours you may shower with the patch on. Take brief showers with your back facing the shower head.  6. Do not remove patch once it has been placed because that will interrupt data and decrease adhesive wear time. 7. Push the button when you have any symptoms and write down what you were feeling. 8. Once you have completed wearing your monitor, remove and place into box which has postage paid and place in your outgoing mailbox.  9. If for some reason you have misplaced your box then call our office and we can provide another box and/or mail it off for you.        Follow-Up: At [facility], you and your health needs are our priority.  As part of our continuing mission to provide you with exceptional heart care, we have created designated Provider Care Teams.  These Care Teams include your primary Cardiologist (physician) and Advanced Practice Providers (APPs -  Physician Assistants and Nurse Practitioners) who all work together to provide you with the care you need, when you need it.  . You will need a follow up appointment in 3 months after echo in July   . Providers on your designated Care Team:   . Murray Hodgkins, NP . Christell Faith, PA-C . Marrianne Mood, PA-C  Any Other Special Instructions Will Be Listed Below (If Applicable).  For educational health videos Log in to : [email or web address]  Or : [email or web address], password : [email or web address]

## 2019-02-25 ENCOUNTER — Telehealth: Payer: Self-pay | Admitting: Cardiovascular Disease

## 2019-02-25 NOTE — Telephone Encounter (Signed)
Patient calling States that she went to get her Xarelto on Friday and it was $1500 Patient will not be able to afford this Patient states her prescription part for Medicare is not active yet She was able to get 5 pills for the moment but would like to know what other options she may have  Please call to discuss

## 2019-02-25 NOTE — Telephone Encounter (Signed)
I spoke with the patient. She states that she is waiting on her Medicare Part D to become active. She is going to check with her insurance company to see when this should take effect.   She went to pick up her Xarelto and it was $1500 for #90 day supply. She tried [facility] and this did not take off but about $5.  I advised her that [facility] is not very helpful with the NOAC drugs. She did get 5 tablets from the pharmacy. She wlll take her last one tomorrow.  I tried to look up a 30 day free trial card for her on-line, but was unable to locate this. I advised the patient that I will send a message to Dr. Donivan Scull nurse as she is currently in the office and ask her if she can locate a free 30 day card and some samples for the patient.  Again, she will check on the status of her Part D with her insurance.  She is aware she will receive a call back later today.   The patient voices understanding of the above and is agreeable.

## 2019-02-26 NOTE — Telephone Encounter (Signed)
Samples placed up front for patient along with Co-pay card.  Please make patient aware when you call her.    Drug name: XARELTO    Strength: 20mg         Qty: 4 bottles  LOT: [id number]  Exp.Date: Jun/2022  Allison [name] 4:37 PM 02/26/2019

## 2019-02-26 NOTE — Patient Instructions (Signed)

## 2019-02-26 NOTE — Telephone Encounter (Signed)
Called and spoke with patient and she states that she is going to reach out to the person who assisted her with setting up medicare services to see what she has. She is not aware of part D pharmacy coverage. Advised that samples are up front for her to pick up and provided her number to Xarelto patient assistance foundation for them to send her the paperwork. Reviewed that she would need to complete page 1 and bring her income information, and copy of her medication coverage. Physician portion of patient assistance application form completed and signed by provider. Instructed her to please call if she has any further questions or if we can assist with anything further.   Physician form completed and placed in Allison [name] above [place] desk.

## 2019-03-06 ENCOUNTER — Other Ambulatory Visit: Payer: Self-pay | Admitting: Internal Medicine

## 2019-03-07 NOTE — Telephone Encounter (Signed)
Refilled: 01/11/2019 Last OV: 02/04/2019 Next OV: 05/16/2019

## 2019-03-11 ENCOUNTER — Other Ambulatory Visit: Payer: Self-pay | Admitting: Internal Medicine

## 2019-03-12 NOTE — Telephone Encounter (Signed)
Refilled: 03/07/2019 Last OV:  02/04/2019 Next OV: 05/16/2019

## 2019-03-13 NOTE — Telephone Encounter (Signed)
A prescription for xanax was just sent in on 03/07/19.  Please confirm with pt/pharmacy. She should not need rx now.

## 2019-03-26 ENCOUNTER — Other Ambulatory Visit: Payer: Self-pay | Admitting: Internal Medicine

## 2019-04-16 ENCOUNTER — Other Ambulatory Visit: Payer: Self-pay | Admitting: Cardiovascular Disease

## 2019-04-16 MED ORDER — DILTIAZEM HCL ER COATED BEADS 120 MG PO CP24: 120.0000 mg | ORAL_CAPSULE | Freq: Every day | ORAL | 5 refills | Status: DC

## 2019-04-16 NOTE — Telephone Encounter (Signed)
°*  STAT* If patient is at the pharmacy, call can be transferred to refill team.   1. Which medications need to be refilled? (please list name of each medication and dose if known)   cardizem 120 mg po q d    2. Which pharmacy/location (including street and city if local pharmacy) is medication to be sent to? [facility] [address] [place]   3. Do they need a 30 day or 90 day supply? [place]

## 2019-05-02 ENCOUNTER — Telehealth: Payer: Self-pay

## 2019-05-02 NOTE — Telephone Encounter (Signed)

## 2019-05-03 ENCOUNTER — Other Ambulatory Visit: Payer: Self-pay

## 2019-05-03 ENCOUNTER — Ambulatory Visit (INDEPENDENT_AMBULATORY_CARE_PROVIDER_SITE_OTHER): Payer: Medicare Other

## 2019-05-03 DIAGNOSIS — I480 Paroxysmal atrial fibrillation: Secondary | ICD-10-CM

## 2019-05-10 ENCOUNTER — Telehealth: Payer: Self-pay | Admitting: Cardiovascular Disease

## 2019-05-10 NOTE — Telephone Encounter (Signed)
Patient calling to hear results from Echo on 7/10

## 2019-05-13 ENCOUNTER — Telehealth: Payer: Self-pay | Admitting: *Deleted

## 2019-05-13 DIAGNOSIS — E7800 Pure hypercholesterolemia, unspecified: Secondary | ICD-10-CM

## 2019-05-13 DIAGNOSIS — R739 Hyperglycemia, unspecified: Secondary | ICD-10-CM | POA: Insufficient documentation

## 2019-05-13 DIAGNOSIS — I10 Essential (primary) hypertension: Secondary | ICD-10-CM

## 2019-05-13 NOTE — Telephone Encounter (Signed)
Labs ordered.

## 2019-05-13 NOTE — Telephone Encounter (Signed)
Please place future orders for lab appt.  

## 2019-05-14 ENCOUNTER — Other Ambulatory Visit: Payer: Self-pay

## 2019-05-14 ENCOUNTER — Other Ambulatory Visit (INDEPENDENT_AMBULATORY_CARE_PROVIDER_SITE_OTHER): Payer: Medicare Other

## 2019-05-14 DIAGNOSIS — R739 Hyperglycemia, unspecified: Secondary | ICD-10-CM | POA: Diagnosis not present

## 2019-05-14 DIAGNOSIS — E7800 Pure hypercholesterolemia, unspecified: Secondary | ICD-10-CM | POA: Diagnosis not present

## 2019-05-14 DIAGNOSIS — I10 Essential (primary) hypertension: Secondary | ICD-10-CM

## 2019-05-14 LAB — HEPATIC FUNCTION PANEL
ALT: 14 U/L (ref 0–35)
AST: 16 U/L (ref 0–37)
Albumin: 4.3 g/dL (ref 3.5–5.2)
Alkaline Phosphatase: 57 U/L (ref 39–117)
Bilirubin, Direct: 0.1 mg/dL (ref 0.0–0.3)
Total Bilirubin: 0.6 mg/dL (ref 0.2–1.2)
Total Protein: 6.5 g/dL (ref 6.0–8.3)

## 2019-05-14 LAB — LIPID PANEL
Cholesterol: 215 mg/dL — ABNORMAL HIGH (ref 0–200)
HDL: 62.2 mg/dL (ref >39.00)
LDL Cholesterol: 139 mg/dL — ABNORMAL HIGH (ref 0–99)
NonHDL: 152.39
Total CHOL/HDL Ratio: 3
Triglycerides: 69 mg/dL (ref 0.0–149.0)
VLDL: 13.8 mg/dL (ref 0.0–40.0)

## 2019-05-14 LAB — BASIC METABOLIC PANEL
BUN: 14 mg/dL (ref 6–23)
CO2: 32 mEq/L (ref 19–32)
Calcium: 9.3 mg/dL (ref 8.4–10.5)
Chloride: 103 mEq/L (ref 96–112)
Creatinine, Ser: 0.68 mg/dL (ref 0.40–1.20)
GFR: 86.38 mL/min (ref >60.00)
Glucose, Bld: 93 mg/dL (ref 70–99)
Potassium: 4 mEq/L (ref 3.5–5.1)
Sodium: 141 mEq/L (ref 135–145)

## 2019-05-14 LAB — HEMOGLOBIN A1C: Hgb A1c MFr Bld: 5.7 % (ref 4.6–6.5)

## 2019-05-14 NOTE — Telephone Encounter (Signed)
Spoke with patient and reviewed her echocardiogram results and answered all of her questions. She states that her insurance did go through and her price for xarelto was $145ish. Reviewed that we could do patient assistance application but she reports that last year she was working full time and now is no longer working. Advised that I would review application and give her a call back. She does have a part time job but has been laid off due to recent virus outbreak. Advised that I would reach out to company to see what options are available and would also reach out to pharmacy. I do have a portion of the Xarelto application completed so will check on this and then reach back out to patient to review.

## 2019-05-16 ENCOUNTER — Encounter: Payer: Self-pay | Admitting: Internal Medicine

## 2019-05-16 ENCOUNTER — Ambulatory Visit (INDEPENDENT_AMBULATORY_CARE_PROVIDER_SITE_OTHER): Payer: Medicare Other | Admitting: Internal Medicine

## 2019-05-16 ENCOUNTER — Other Ambulatory Visit: Payer: Self-pay

## 2019-05-16 VITALS — BP 130/82 | HR 60 | Ht 68.0 in | Wt 130.0 lb

## 2019-05-16 DIAGNOSIS — I10 Essential (primary) hypertension: Secondary | ICD-10-CM | POA: Diagnosis not present

## 2019-05-16 DIAGNOSIS — R634 Abnormal weight loss: Secondary | ICD-10-CM

## 2019-05-16 DIAGNOSIS — K219 Gastro-esophageal reflux disease without esophagitis: Secondary | ICD-10-CM

## 2019-05-16 DIAGNOSIS — R739 Hyperglycemia, unspecified: Secondary | ICD-10-CM

## 2019-05-16 DIAGNOSIS — E7800 Pure hypercholesterolemia, unspecified: Secondary | ICD-10-CM

## 2019-05-16 DIAGNOSIS — Z1231 Encounter for screening mammogram for malignant neoplasm of breast: Secondary | ICD-10-CM

## 2019-05-16 DIAGNOSIS — I480 Paroxysmal atrial fibrillation: Secondary | ICD-10-CM | POA: Diagnosis not present

## 2019-05-16 MED ORDER — ROSUVASTATIN CALCIUM 10 MG PO TABS: 10.0000 mg | ORAL_TABLET | Freq: Every day | ORAL | 0 refills | Status: DC

## 2019-05-16 NOTE — Progress Notes (Signed)
Patient ID: Allison [name], female   DOB: Jun 13, 1951, 68 y.o.   MRN: [id number]   Virtual Visit via telephone Note  This visit type was conducted due to national recommendations for restrictions regarding the COVID-19 pandemic (e.g. social distancing).  This format is felt to be most appropriate for this patient at this time.  All issues noted in this document were discussed and addressed.  No physical exam was performed (except for noted visual exam findings with Video Visits).   I connected with Allison [name] by telephone and verified that I am speaking with the correct person using two identifiers. Location patient: home Location provider: work Persons participating in the telephone visit: patient, provider  I discussed the limitations, risks, security and privacy concerns of performing an evaluation and management service by telephone and the availability of in person appointments.  The patient expressed understanding and agreed to proceed.   Reason for visit: scheduled follow up  HPI: She reports she is doing relatively well.  Saw cardiology 02/22/19.  Metoprolol added.  Takes in the evening.  Reports no increased heart rate or palpitations.  No chest pain with increased activity or exertion.  No sob.  No acid reflux.  No abdominal pain. Bowels moving.  Blood pressure doing well.  On pravastatin.  Discussed changing to crestor.  Recently started on xarelto.  Weight is stable.  Eating and drinking well.  Retired from driving a truck.  Had a job as a Mudlogger.  Out of work now secondary to covid. Discussed recent labs.     ROS: See pertinent positives and negatives per HPI.  Past Medical History:  Diagnosis Date  . GERD (gastroesophageal reflux disease)   . Hypercholesterolemia   . Hypertension   . Migraine headache   . Psoriasis     Past Surgical History:  Procedure Laterality Date  . COLONOSCOPY WITH PROPOFOL N/A 12/23/2016   Procedure: COLONOSCOPY WITH PROPOFOL;   Surgeon: Lollie Sails, MD;  Location: [facility] ENDOSCOPY;  Service: Endoscopy;  Laterality: N/A;  . DILATION AND CURETTAGE OF UTERUS     history of abnormal bleeding  . TUBAL LIGATION      Family History  Problem Relation Age of Onset  . Asthma Father   . Diabetes Mother   . CVA Mother   . Psoriasis Mother   . Breast cancer Maternal Grandmother   . Breast cancer Paternal Grandmother   . Colon cancer Maternal Uncle   . Colon cancer Other        nephew  . Diabetes Other        multiple relatives (both sides)    SOCIAL HX: reviewed.    Current Outpatient Medications:  .  ALPRAZolam (XANAX) 0.25 MG tablet, TAKE 1 TABLET BY MOUTH ONCE DAILY AS NEEDED, Disp: 30 tablet, Rfl: 0 .  diltiazem (CARDIZEM CD) 120 MG 24 hr capsule, Take 1 capsule (120 mg total) by mouth daily., Disp: 30 capsule, Rfl: 5 .  diltiazem (CARDIZEM) 30 MG tablet, Take 1 tablet (30 mg total) by mouth 3 (three) times daily as needed (As needed for tachycardia/atrial fib)., Disp: 90 tablet, Rfl: 3 .  hyoscyamine (LEVSIN, ANASPAZ) 0.125 MG tablet, TAKE 1 TABLET BY MOUTH EVERY 6 HOURS AS NEEDED, Disp: 30 tablet, Rfl: 0 .  metoprolol succinate (TOPROL-XL) 50 MG 24 hr tablet, Take 1 tablet (50 mg total) by mouth daily after supper. Take with or immediately following a meal., Disp: 90 tablet, Rfl: 3 .  omeprazole (PRILOSEC) 20 MG  capsule, Take 1 capsule by mouth once daily, Disp: 90 capsule, Rfl: 1 .  rivaroxaban (XARELTO) 20 MG TABS tablet, Take 1 tablet (20 mg total) by mouth daily with supper., Disp: 30 tablet, Rfl: 11 .  rosuvastatin (CRESTOR) 10 MG tablet, Take 1 tablet (10 mg total) by mouth daily., Disp: 90 tablet, Rfl: 0  EXAM:  VITALS per patient if applicable:  Weight 546 pounds, 130/82, 60  GENERAL: alert. Sounds to be in no acute distress.  Answering questions appropriately.    PSYCH/NEURO: pleasant and cooperative, no obvious depression or anxiety, speech and thought processing grossly intact  ASSESSMENT  AND PLAN:  Discussed the following assessment and plan:  GERD (gastroesophageal reflux disease) On prilosec.  Controlled.    Hypercholesterolemia On pravastatin.  Cholesterol results reviewed.  Change to crestor.  Follow lipid panel.    Hyperglycemia Follow met b and a1c.    Hypertension Blood pressure doing well.  Follow pressures.  Follow metabolic panel.    Paroxysmal atrial fibrillation ([place]) Followed by cardiology.  Doing well on diltiazem and metoprolol.  On xarelto now.  Overall doing well.  Follow.    Weight loss Weight stable per pt.  Eating well.  Follow.      I discussed the assessment and treatment plan with the patient. The patient was provided an opportunity to ask questions and all were answered. The patient agreed with the plan and demonstrated an understanding of the instructions.   The patient was advised to call back or seek an in-person evaluation if the symptoms worsen or if the condition fails to improve as anticipated.  I provided 32 minutes of non-face-to-face time during this encounter.   Einar Pheasant, MD

## 2019-05-17 ENCOUNTER — Other Ambulatory Visit: Payer: Self-pay | Admitting: Internal Medicine

## 2019-05-19 ENCOUNTER — Encounter: Payer: Self-pay | Admitting: Internal Medicine

## 2019-05-19 NOTE — Assessment & Plan Note (Signed)
Weight stable per pt.  Eating well.  Follow.

## 2019-05-19 NOTE — Assessment & Plan Note (Signed)
On prilosec.  Controlled.   

## 2019-05-19 NOTE — Assessment & Plan Note (Signed)
On pravastatin.  Cholesterol results reviewed.  Change to crestor.  Follow lipid panel.

## 2019-05-19 NOTE — Assessment & Plan Note (Signed)
Follow met b and a1c.

## 2019-05-19 NOTE — Assessment & Plan Note (Signed)
Blood pressure doing well.  Follow pressures.  Follow metabolic panel.  

## 2019-05-19 NOTE — Assessment & Plan Note (Signed)
Followed by cardiology.  Doing well on diltiazem and metoprolol.  On xarelto now.  Overall doing well.  Follow.

## 2019-05-20 NOTE — Telephone Encounter (Signed)
rx ok'd for alprazolam #30 with no refills.   

## 2019-05-20 NOTE — Telephone Encounter (Signed)
Last OV 05/16/2019  Next OV none sch Last refill 03/07/2019

## 2019-05-21 NOTE — Telephone Encounter (Signed)
Spoke with Xarelto rep and she will email information to me regarding patient assistance program along with change in patient income.

## 2019-06-10 DIAGNOSIS — M5442 Lumbago with sciatica, left side: Secondary | ICD-10-CM | POA: Diagnosis not present

## 2019-06-10 DIAGNOSIS — M9902 Segmental and somatic dysfunction of thoracic region: Secondary | ICD-10-CM | POA: Diagnosis not present

## 2019-06-10 DIAGNOSIS — M955 Acquired deformity of pelvis: Secondary | ICD-10-CM | POA: Diagnosis not present

## 2019-06-10 DIAGNOSIS — M9905 Segmental and somatic dysfunction of pelvic region: Secondary | ICD-10-CM | POA: Diagnosis not present

## 2019-06-10 DIAGNOSIS — M9903 Segmental and somatic dysfunction of lumbar region: Secondary | ICD-10-CM | POA: Diagnosis not present

## 2019-06-10 DIAGNOSIS — M546 Pain in thoracic spine: Secondary | ICD-10-CM | POA: Diagnosis not present

## 2019-06-11 ENCOUNTER — Telehealth: Payer: Self-pay | Admitting: *Deleted

## 2019-06-11 NOTE — Telephone Encounter (Signed)
Copied from [place] [id number]. Topic: General - Inquiry >> Jun 11, 2019 12:05 PM Allison [name], NT wrote: Reason for CRM: Patient called in and is wanting current medication list printed out for her to pick up at her next lab appointment. Please advise.

## 2019-06-12 DIAGNOSIS — M5442 Lumbago with sciatica, left side: Secondary | ICD-10-CM | POA: Diagnosis not present

## 2019-06-12 DIAGNOSIS — M9902 Segmental and somatic dysfunction of thoracic region: Secondary | ICD-10-CM | POA: Diagnosis not present

## 2019-06-12 DIAGNOSIS — M9903 Segmental and somatic dysfunction of lumbar region: Secondary | ICD-10-CM | POA: Diagnosis not present

## 2019-06-12 DIAGNOSIS — M546 Pain in thoracic spine: Secondary | ICD-10-CM | POA: Diagnosis not present

## 2019-06-12 DIAGNOSIS — M9905 Segmental and somatic dysfunction of pelvic region: Secondary | ICD-10-CM | POA: Diagnosis not present

## 2019-06-12 DIAGNOSIS — M955 Acquired deformity of pelvis: Secondary | ICD-10-CM | POA: Diagnosis not present

## 2019-06-12 NOTE — Telephone Encounter (Signed)
Pt aware.

## 2019-06-12 NOTE — Telephone Encounter (Signed)
Printed and placed up front for pt

## 2019-06-19 ENCOUNTER — Other Ambulatory Visit: Payer: Medicare Other

## 2019-06-20 ENCOUNTER — Other Ambulatory Visit: Payer: Self-pay

## 2019-06-20 DIAGNOSIS — R6889 Other general symptoms and signs: Secondary | ICD-10-CM | POA: Diagnosis not present

## 2019-06-20 DIAGNOSIS — Z20822 Contact with and (suspected) exposure to covid-19: Secondary | ICD-10-CM

## 2019-06-21 LAB — NOVEL CORONAVIRUS, NAA: SARS-CoV-2, NAA: NOT DETECTED

## 2019-06-27 DIAGNOSIS — M546 Pain in thoracic spine: Secondary | ICD-10-CM | POA: Diagnosis not present

## 2019-06-27 DIAGNOSIS — M9903 Segmental and somatic dysfunction of lumbar region: Secondary | ICD-10-CM | POA: Diagnosis not present

## 2019-06-27 DIAGNOSIS — M9902 Segmental and somatic dysfunction of thoracic region: Secondary | ICD-10-CM | POA: Diagnosis not present

## 2019-06-27 DIAGNOSIS — M9905 Segmental and somatic dysfunction of pelvic region: Secondary | ICD-10-CM | POA: Diagnosis not present

## 2019-06-27 DIAGNOSIS — M5442 Lumbago with sciatica, left side: Secondary | ICD-10-CM | POA: Diagnosis not present

## 2019-06-27 DIAGNOSIS — M955 Acquired deformity of pelvis: Secondary | ICD-10-CM | POA: Diagnosis not present

## 2019-06-28 ENCOUNTER — Other Ambulatory Visit: Payer: Medicare Other

## 2019-06-28 ENCOUNTER — Other Ambulatory Visit: Payer: Self-pay

## 2019-07-04 NOTE — Telephone Encounter (Signed)
Left voicemail message for her to call back. Per rep patient should be able to fill out assistance form and provide necessary information and then call their number to update on income changes.

## 2019-07-05 ENCOUNTER — Telehealth: Payer: Self-pay | Admitting: Cardiovascular Disease

## 2019-07-05 NOTE — Telephone Encounter (Signed)
Please call regarding Xarelto rx. Patient states she was speaking with someone regarding filling out some paperwork for this medication.

## 2019-07-08 ENCOUNTER — Other Ambulatory Visit: Payer: Self-pay | Admitting: Internal Medicine

## 2019-07-09 NOTE — Telephone Encounter (Signed)
See other telephone encounter. Pt no longer needs at this time.

## 2019-07-09 NOTE — Telephone Encounter (Signed)
Spoke with patient and she is now paying $45.00 per month for her prescription and she now has medicare and some other insurance so it covers more. Reviewed that if we need to revisit patient assistance that we can certainly do that and to just let us know. She did confirm upcoming appointment and had no further questions at this time.

## 2019-07-10 NOTE — Telephone Encounter (Signed)
Refilled: 05/20/2019 Last OV: 05/16/2019 Next OV: 08/23/2019

## 2019-07-11 DIAGNOSIS — L570 Actinic keratosis: Secondary | ICD-10-CM | POA: Diagnosis not present

## 2019-07-11 DIAGNOSIS — L400 Psoriasis vulgaris: Secondary | ICD-10-CM | POA: Diagnosis not present

## 2019-07-16 DIAGNOSIS — K219 Gastro-esophageal reflux disease without esophagitis: Secondary | ICD-10-CM | POA: Diagnosis not present

## 2019-07-16 DIAGNOSIS — K5790 Diverticulosis of intestine, part unspecified, without perforation or abscess without bleeding: Secondary | ICD-10-CM | POA: Diagnosis not present

## 2019-07-18 ENCOUNTER — Other Ambulatory Visit (INDEPENDENT_AMBULATORY_CARE_PROVIDER_SITE_OTHER): Payer: Medicare Other

## 2019-07-18 ENCOUNTER — Other Ambulatory Visit: Payer: Self-pay

## 2019-07-18 DIAGNOSIS — E7800 Pure hypercholesterolemia, unspecified: Secondary | ICD-10-CM

## 2019-07-18 LAB — HEPATIC FUNCTION PANEL
ALT: 12 U/L (ref 0–35)
AST: 16 U/L (ref 0–37)
Albumin: 4.2 g/dL (ref 3.5–5.2)
Alkaline Phosphatase: 63 U/L (ref 39–117)
Bilirubin, Direct: 0.1 mg/dL (ref 0.0–0.3)
Total Bilirubin: 0.5 mg/dL (ref 0.2–1.2)
Total Protein: 6.4 g/dL (ref 6.0–8.3)

## 2019-07-19 ENCOUNTER — Encounter: Payer: Self-pay | Admitting: Internal Medicine

## 2019-07-25 ENCOUNTER — Ambulatory Visit: Payer: Medicare Other | Admitting: Physician Assistant

## 2019-07-25 DIAGNOSIS — M955 Acquired deformity of pelvis: Secondary | ICD-10-CM | POA: Diagnosis not present

## 2019-07-25 DIAGNOSIS — M546 Pain in thoracic spine: Secondary | ICD-10-CM | POA: Diagnosis not present

## 2019-07-25 DIAGNOSIS — M9903 Segmental and somatic dysfunction of lumbar region: Secondary | ICD-10-CM | POA: Diagnosis not present

## 2019-07-25 DIAGNOSIS — M9902 Segmental and somatic dysfunction of thoracic region: Secondary | ICD-10-CM | POA: Diagnosis not present

## 2019-07-25 DIAGNOSIS — M9905 Segmental and somatic dysfunction of pelvic region: Secondary | ICD-10-CM | POA: Diagnosis not present

## 2019-07-25 DIAGNOSIS — M5442 Lumbago with sciatica, left side: Secondary | ICD-10-CM | POA: Diagnosis not present

## 2019-07-26 NOTE — Progress Notes (Signed)
Cardiology Office Note  Date:  07/29/2019   ID:  REDA PIWOWARSKI, DOB 07-23-1951, MRN [id number]  PCP:  Einar Pheasant, MD   Chief Complaint  Patient presents with  . other    3 month follow up. Meds reviewed by the pt. verbally. "doing well."     HPI:  [name] is a 68 year old woman with past medical history of paroxysmal atrial fibrillation several episodes 01/2019 Essential hypertension Seen in the emergency room January 31, 2019 for Atrial fibrillation Who presents for follow-up of her paroxysmal atrial fibrillation  Tele visit Feb 22, 2019 Started on metoprolol, continued on diltiazem, Xarelto Denies having recent episodes of atrial fibrillation in the past several months CHADS VASC 2, tolerating Xarelto No bleeding  Echocardiogram performed May 03, 2019 Discussed with her 1. The left ventricle has normal systolic function, with an ejection fraction of 55-60%. The cavity size was normal. Left ventricular diastolic parameters were normal.  2. The right ventricle has normal systolic function. The cavity was normal. There is no increase in right ventricular wall thickness.  Active, no complaints  CT of ABD 2017 Images pulled up today and discussed with her Minimal descending aorta atherosclerosis, was described as moderate but there is minimal plaque Images reviewed  EKG personally reviewed by myself on todays visit Shows normal sinus rhythm rate 56 bpm baseline artifact, no significant ST-T wave changes  Mother with a CVA, age 33 Brother with TIA/CVA Strong family hx of atrial fib  Other PMH Atrial fib 01/31/2019, 11 Am developed tachycardia Went to the emergency room, EKG showing atrial fibrillation 190 bpm  given diltiazem IV bolus repeat dosing, improved rate down to 130 bpm Discharged on Cardizem CD 120 mg daily  potassium was low at 3.2, on HCTZ   PMH:   has a past medical history of GERD (gastroesophageal reflux disease), Hypercholesterolemia,  Hypertension, Migraine headache, and Psoriasis.  PSH:    Past Surgical History:  Procedure Laterality Date  . COLONOSCOPY WITH PROPOFOL N/A 12/23/2016   Procedure: COLONOSCOPY WITH PROPOFOL;  Surgeon: Lollie Sails, MD;  Location: [facility] ENDOSCOPY;  Service: Endoscopy;  Laterality: N/A;  . DILATION AND CURETTAGE OF UTERUS     history of abnormal bleeding  . TUBAL LIGATION      Current Outpatient Medications  Medication Sig Dispense Refill  . ALPRAZolam (XANAX) 0.25 MG tablet TAKE 1 TABLET BY MOUTH ONCE DAILY AS NEEDED 30 tablet 0  . diltiazem (CARDIZEM CD) 120 MG 24 hr capsule Take 1 capsule (120 mg total) by mouth daily. 30 capsule 5  . diltiazem (CARDIZEM) 30 MG tablet Take 1 tablet (30 mg total) by mouth 3 (three) times daily as needed (As needed for tachycardia/atrial fib). 90 tablet 3  . hyoscyamine (LEVSIN, ANASPAZ) 0.125 MG tablet TAKE 1 TABLET BY MOUTH EVERY 6 HOURS AS NEEDED 30 tablet 0  . metoprolol succinate (TOPROL-XL) 50 MG 24 hr tablet Take 1 tablet (50 mg total) by mouth daily after supper. Take with or immediately following a meal. 90 tablet 3  . omeprazole (PRILOSEC) 20 MG capsule Take 1 capsule by mouth once daily 90 capsule 1  . rivaroxaban (XARELTO) 20 MG TABS tablet Take 1 tablet (20 mg total) by mouth daily with supper. 30 tablet 11  . rosuvastatin (CRESTOR) 10 MG tablet Take 1 tablet (10 mg total) by mouth daily. 90 tablet 0   No current facility-administered medications for this visit.      Allergies:   Iodine   Social  History:  The patient  reports that she has never smoked. She has never used smokeless tobacco. She reports that she does not drink alcohol or use drugs.   Family History:   family history includes Asthma in her father; Breast cancer in her maternal grandmother and paternal grandmother; CVA in her mother; Colon cancer in her maternal uncle and another family member; Diabetes in her mother and another family member; Psoriasis in her mother.     Review of Systems: Review of Systems  Constitutional: Negative.   HENT: Negative.   Respiratory: Negative.   Cardiovascular: Negative.   Gastrointestinal: Negative.   Musculoskeletal: Negative.   Neurological: Negative.   Psychiatric/Behavioral: Negative.   All other systems reviewed and are negative.   PHYSICAL EXAM: VS:  BP 118/80 (BP Location: Left Arm, Patient Position: Sitting, Cuff Size: Normal)   Pulse 60   Ht 5\' 8"  (1.727 m)   Wt 139 lb (63 kg)   BMI 21.13 kg/m  , BMI Body mass index is 21.13 kg/m. GEN: Well nourished, well developed, in no acute distress HEENT: normal Neck: no JVD, carotid bruits, or masses Cardiac: RRR; no murmurs, rubs, or gallops,no edema  Respiratory:  clear to auscultation bilaterally, normal work of breathing GI: soft, nontender, nondistended, + BS MS: no deformity or atrophy Skin: warm and dry, no rash Neuro:  Strength and sensation are intact Psych: euthymic mood, full affect  Recent Labs: 01/31/2019: TSH 1.149 02/16/2019: Hemoglobin 13.8; Platelets 247 05/14/2019: BUN 14; Creatinine, Ser 0.68; Potassium 4.0; Sodium 141 07/18/2019: ALT 12    Lipid Panel Lab Results  Component Value Date   CHOL 215 (H) 05/14/2019   HDL 62.20 05/14/2019   LDLCALC 139 (H) 05/14/2019   TRIG 69.0 05/14/2019      Wt Readings from Last 3 Encounters:  07/29/19 139 lb (63 kg)  05/16/19 130 lb (59 kg)  02/16/19 130 lb (59 kg)       ASSESSMENT AND PLAN:  Problem List Items Addressed This Visit      Cardiology Problems   Hypertension   Hypercholesterolemia   Paroxysmal atrial fibrillation (HCC) - Primary   Relevant Orders   EKG 12-Lead     Paroxysmal atrial fibrillation ([facility]) -   Continue diltiazem CD 120 mg daily and metoprolol CHADS VASC 2, continue xarelto 20 mg daily Not having very much by way of symptoms of atrial fibrillation  Essential hypertension Blood pressure is well controlled on today's visit. No changes made to the  medications.  Hypercholesterolemia LDL climbed 139 We did review images of CT abdomen 2017 with her in the office today and there is minimal descending aorta atherosclerosis She will stay on the Crestor  Aortic atherosclerosis She will stay on Crestor  Disposition:   F/U  12 months   Total encounter time more than 25 minutes  Greater than 50% was spent in counseling and coordination of care with the patient    Signed, Esmond Plants, M.D., Ph.D. [place], [place]

## 2019-07-29 ENCOUNTER — Other Ambulatory Visit: Payer: Self-pay

## 2019-07-29 ENCOUNTER — Ambulatory Visit (INDEPENDENT_AMBULATORY_CARE_PROVIDER_SITE_OTHER): Payer: Medicare Other | Admitting: Cardiovascular Disease

## 2019-07-29 ENCOUNTER — Encounter: Payer: Self-pay | Admitting: Cardiovascular Disease

## 2019-07-29 VITALS — BP 118/80 | HR 60 | Ht 68.0 in | Wt 139.0 lb

## 2019-07-29 DIAGNOSIS — I10 Essential (primary) hypertension: Secondary | ICD-10-CM

## 2019-07-29 DIAGNOSIS — E7800 Pure hypercholesterolemia, unspecified: Secondary | ICD-10-CM | POA: Diagnosis not present

## 2019-07-29 DIAGNOSIS — I480 Paroxysmal atrial fibrillation: Secondary | ICD-10-CM | POA: Diagnosis not present

## 2019-07-29 NOTE — Patient Instructions (Signed)

## 2019-07-31 ENCOUNTER — Ambulatory Visit
Admission: RE | Admit: 2019-07-31 | Discharge: 2019-07-31 | Disposition: A | Discharge status: Home / Self Care | Payer: Medicare Other | Source: Ambulatory Visit | Attending: Internal Medicine | Admitting: Internal Medicine

## 2019-07-31 DIAGNOSIS — Z1231 Encounter for screening mammogram for malignant neoplasm of breast: Secondary | ICD-10-CM | POA: Insufficient documentation

## 2019-08-09 ENCOUNTER — Other Ambulatory Visit: Payer: Self-pay | Admitting: Internal Medicine

## 2019-08-23 ENCOUNTER — Other Ambulatory Visit: Payer: Self-pay

## 2019-08-23 ENCOUNTER — Ambulatory Visit (INDEPENDENT_AMBULATORY_CARE_PROVIDER_SITE_OTHER): Payer: Medicare Other | Admitting: Internal Medicine

## 2019-08-23 VITALS — BP 116/66 | HR 54 | Temp 96.6°F | Resp 16 | Ht 68.0 in | Wt 145.8 lb

## 2019-08-23 DIAGNOSIS — I10 Essential (primary) hypertension: Secondary | ICD-10-CM

## 2019-08-23 DIAGNOSIS — E7800 Pure hypercholesterolemia, unspecified: Secondary | ICD-10-CM | POA: Diagnosis not present

## 2019-08-23 DIAGNOSIS — I480 Paroxysmal atrial fibrillation: Secondary | ICD-10-CM

## 2019-08-23 DIAGNOSIS — K219 Gastro-esophageal reflux disease without esophagitis: Secondary | ICD-10-CM | POA: Diagnosis not present

## 2019-08-23 DIAGNOSIS — Z0000 Encounter for general adult medical examination without abnormal findings: Secondary | ICD-10-CM

## 2019-08-23 DIAGNOSIS — Z23 Encounter for immunization: Secondary | ICD-10-CM | POA: Diagnosis not present

## 2019-08-23 DIAGNOSIS — R739 Hyperglycemia, unspecified: Secondary | ICD-10-CM

## 2019-08-23 DIAGNOSIS — K5790 Diverticulosis of intestine, part unspecified, without perforation or abscess without bleeding: Secondary | ICD-10-CM

## 2019-08-23 NOTE — Progress Notes (Signed)
Patient ID: Allison [name], female   DOB: Mar 03, 1951, 68 y.o.   MRN: [id number]   Subjective:    Patient ID: Allison [name], female    DOB: May 23, 1951, 68 y.o.   MRN: [id number]  HPI  Patient here for her physical exam.  She reports she is doing relatively well.  Saw cardiology 07/29/19 - f/u afib.  Stable on xarelto.  Recommended f/u in one year.  No chest pain.  No sob.  No acid reflux.  No abdominal pain.  Bowels stable - using miralax.  Blood pressure doing well.  Handling stress.  Not working currently.  Was let go - with covid.  Some increased stress related to this, but overall she feels she is doing well.     Past Medical History:  Diagnosis Date  . GERD (gastroesophageal reflux disease)   . Hypercholesterolemia   . Hypertension   . Migraine headache   . Psoriasis    Past Surgical History:  Procedure Laterality Date  . COLONOSCOPY WITH PROPOFOL N/A 12/23/2016   Procedure: COLONOSCOPY WITH PROPOFOL;  Surgeon: Lollie Sails, MD;  Location: [facility] ENDOSCOPY;  Service: Endoscopy;  Laterality: N/A;  . DILATION AND CURETTAGE OF UTERUS     history of abnormal bleeding  . TUBAL LIGATION     Family History  Problem Relation Age of Onset  . Asthma Father   . Diabetes Mother   . CVA Mother   . Psoriasis Mother   . Breast cancer Maternal Grandmother   . Breast cancer Paternal Grandmother   . Colon cancer Maternal Uncle   . Colon cancer Other        nephew  . Diabetes Other        multiple relatives (both sides)   Social History   Socioeconomic History  . Marital status: Single    Spouse name: Not on file  . Number of children: 1  . Years of education: Not on file  . Highest education level: Not on file  Occupational History  . Not on file  Social Needs  . Financial resource strain: Not on file  . Food insecurity    Worry: Not on file    Inability: Not on file  . Transportation needs    Medical: Not on file    Non-medical: Not on file  Tobacco Use  .  Smoking status: Never Smoker  . Smokeless tobacco: Never Used  Substance and Sexual Activity  . Alcohol use: No    Alcohol/week: 0.0 standard drinks  . Drug use: No  . Sexual activity: Not on file  Lifestyle  . Physical activity    Days per week: Not on file    Minutes per session: Not on file  . Stress: Not on file  Relationships  . Social Herbalist on phone: Not on file    Gets together: Not on file    Attends religious service: Not on file    Active member of club or organization: Not on file    Attends meetings of clubs or organizations: Not on file    Relationship status: Not on file  Other Topics Concern  . Not on file  Social History Narrative  . Not on file    Outpatient Encounter Medications as of 08/23/2019  Medication Sig  . ALPRAZolam (XANAX) 0.25 MG tablet TAKE 1 TABLET BY MOUTH ONCE DAILY AS NEEDED  . diltiazem (CARDIZEM CD) 120 MG 24 hr capsule Take 1 capsule (120 mg total) by  mouth daily.  Marland Kitchen diltiazem (CARDIZEM) 30 MG tablet Take 1 tablet (30 mg total) by mouth 3 (three) times daily as needed (As needed for tachycardia/atrial fib).  . hyoscyamine (LEVSIN, ANASPAZ) 0.125 MG tablet TAKE 1 TABLET BY MOUTH EVERY 6 HOURS AS NEEDED  . metoprolol succinate (TOPROL-XL) 50 MG 24 hr tablet Take 1 tablet (50 mg total) by mouth daily after supper. Take with or immediately following a meal.  . rivaroxaban (XARELTO) 20 MG TABS tablet Take 1 tablet (20 mg total) by mouth daily with supper.  . rosuvastatin (CRESTOR) 10 MG tablet Take 1 tablet by mouth once daily  . [DISCONTINUED] omeprazole (PRILOSEC) 20 MG capsule Take 1 capsule by mouth once daily   No facility-administered encounter medications on file as of 08/23/2019.    Review of Systems  Constitutional: Negative for appetite change and unexpected weight change.  HENT: Negative for congestion and sinus pressure.   Eyes: Negative for pain and visual disturbance.  Respiratory: Negative for cough, chest  tightness and shortness of breath.   Cardiovascular: Negative for chest pain, palpitations and leg swelling.  Gastrointestinal: Negative for abdominal pain, diarrhea, nausea and vomiting.  Genitourinary: Negative for difficulty urinating and dysuria.  Musculoskeletal: Negative for joint swelling and myalgias.  Skin: Negative for color change and rash.  Neurological: Negative for dizziness, light-headedness and headaches.  Hematological: Negative for adenopathy. Does not bruise/bleed easily.  Psychiatric/Behavioral: Negative for agitation and dysphoric mood.       Objective:    Physical Exam Constitutional:      General: She is not in acute distress.    Appearance: Normal appearance. She is well-developed.  HENT:     Head: Normocephalic and atraumatic.     Right Ear: External ear normal.     Left Ear: External ear normal.  Eyes:     General: No scleral icterus.       Right eye: No discharge.        Left eye: No discharge.     Conjunctiva/sclera: Conjunctivae normal.  Neck:     Musculoskeletal: Neck supple. No muscular tenderness.     Thyroid: No thyromegaly.  Cardiovascular:     Rate and Rhythm: Normal rate and regular rhythm.  Pulmonary:     Effort: No tachypnea, accessory muscle usage or respiratory distress.     Breath sounds: Normal breath sounds. No decreased breath sounds or wheezing.  Chest:     Breasts:        Right: No inverted nipple, mass, nipple discharge or tenderness (no axillary adenopathy).        Left: No inverted nipple, mass, nipple discharge or tenderness (no axilarry adenopathy).  Abdominal:     General: Bowel sounds are normal.     Palpations: Abdomen is soft.     Tenderness: There is no abdominal tenderness.  Musculoskeletal:        General: No swelling or tenderness.  Lymphadenopathy:     Cervical: No cervical adenopathy.  Skin:    Findings: No erythema or rash.  Neurological:     Mental Status: She is alert and oriented to person, place, and  time.  Psychiatric:        Mood and Affect: Mood normal.        Behavior: Behavior normal.     BP 116/66   Pulse (!) 54   Temp (!) 96.6 F (35.9 C)   Resp 16   Ht _0  (1.727 m)   Wt 145 lb 12.8 oz (66.1  kg)   SpO2 98%   BMI 22.17 kg/m  Wt Readings from Last 3 Encounters:  08/23/19 145 lb 12.8 oz (66.1 kg)  07/29/19 139 lb (63 kg)  05/16/19 130 lb (59 kg)     Lab Results  Component Value Date   WBC 6.0 02/16/2019   HGB 13.8 02/16/2019   HCT 40.7 02/16/2019   PLT 247 02/16/2019   GLUCOSE 93 05/14/2019   CHOL 215 (H) 05/14/2019   TRIG 69.0 05/14/2019   HDL 62.20 05/14/2019   LDLCALC 139 (H) 05/14/2019   ALT 12 07/18/2019   AST 16 07/18/2019   NA 141 05/14/2019   K 4.0 05/14/2019   CL 103 05/14/2019   CREATININE 0.68 05/14/2019   BUN 14 05/14/2019   CO2 32 05/14/2019   TSH 1.149 01/31/2019   HGBA1C 5.7 05/14/2019    Mm 3d Screen Breast Bilateral  Result Date: 07/31/2019 CLINICAL DATA:  Screening. EXAM: DIGITAL SCREENING BILATERAL MAMMOGRAM WITH TOMO AND CAD COMPARISON:  Previous exam(s). ACR Breast Density Category b: There are scattered areas of fibroglandular density. FINDINGS: There are no findings suspicious for malignancy. Images were processed with CAD. IMPRESSION: No mammographic evidence of malignancy. A result letter of this screening mammogram will be mailed directly to the patient. RECOMMENDATION: Screening mammogram in one year. (Code:SM-B-01Y) BI-RADS CATEGORY  1: Negative. Electronically Signed   By: Ammie Ferrier M.D.   On: 07/31/2019 15:26       Assessment & Plan:   Problem List Items Addressed This Visit    Diverticulosis    Had colonoscopy 12/2016.  Stable.        GERD (gastroesophageal reflux disease)    Controlled.        Health care maintenance    Physical today 08/23/19.  PAP 07/27/18 - negative with negative HPV.  Mammogram 07/31/19 - birads I.  colonscopy 12/2016 - colitis/proctitis.  Pathology -- mild active colitis.         Hypercholesterolemia    On pravastatin.  Low cholesterol diet and exercise. Follow lipid panel and liver function tests.        Relevant Orders   Hepatic function panel   Lipid panel   Hyperglycemia    Low carb diet and exercise.  Follow met b and a1c.       Relevant Orders   Hemoglobin A1c   Hypertension    Blood pressure under good control.  Continue same medication regimen.  Follow pressures.  Follow metabolic panel.        Relevant Orders   Basic metabolic panel (future)   Paroxysmal atrial fibrillation ([place])    Followed by cardiology.  On xarelto.  Stable.         Other Visit Diagnoses    Need for immunization against influenza       Relevant Orders   Flu Vaccine QUAD High Dose(Fluad) (Completed)       Einar Pheasant, MD

## 2019-08-23 NOTE — Assessment & Plan Note (Addendum)
Physical today 08/23/19.  PAP 07/27/18 - negative with negative HPV.  Mammogram 07/31/19 - birads I.  colonscopy 12/2016 - colitis/proctitis.  Pathology -- mild active colitis.

## 2019-08-23 NOTE — Patient Instructions (Signed)
prevnar - name of pneumonia vaccine that you need.

## 2019-08-27 ENCOUNTER — Other Ambulatory Visit: Payer: Self-pay | Admitting: Internal Medicine

## 2019-08-29 DIAGNOSIS — M9905 Segmental and somatic dysfunction of pelvic region: Secondary | ICD-10-CM | POA: Diagnosis not present

## 2019-08-29 DIAGNOSIS — M9902 Segmental and somatic dysfunction of thoracic region: Secondary | ICD-10-CM | POA: Diagnosis not present

## 2019-08-29 DIAGNOSIS — M9903 Segmental and somatic dysfunction of lumbar region: Secondary | ICD-10-CM | POA: Diagnosis not present

## 2019-08-29 DIAGNOSIS — M5442 Lumbago with sciatica, left side: Secondary | ICD-10-CM | POA: Diagnosis not present

## 2019-08-29 DIAGNOSIS — M955 Acquired deformity of pelvis: Secondary | ICD-10-CM | POA: Diagnosis not present

## 2019-08-31 ENCOUNTER — Encounter: Payer: Self-pay | Admitting: Internal Medicine

## 2019-08-31 NOTE — Assessment & Plan Note (Signed)
Blood pressure under good control.  Continue same medication regimen.  Follow pressures.  Follow metabolic panel.   

## 2019-08-31 NOTE — Assessment & Plan Note (Signed)
On pravastatin.  Low cholesterol diet and exercise.  Follow lipid panel and liver function tests.   

## 2019-08-31 NOTE — Assessment & Plan Note (Signed)
Had colonoscopy 12/2016.  Stable.   

## 2019-08-31 NOTE — Assessment & Plan Note (Signed)
Followed by cardiology.  On xarelto.  Stable.   

## 2019-08-31 NOTE — Assessment & Plan Note (Signed)
Controlled.  

## 2019-08-31 NOTE — Assessment & Plan Note (Signed)
Low carb diet and exercise.  Follow met b and a1c.  

## 2019-09-12 ENCOUNTER — Other Ambulatory Visit: Payer: Self-pay

## 2019-09-13 ENCOUNTER — Other Ambulatory Visit (INDEPENDENT_AMBULATORY_CARE_PROVIDER_SITE_OTHER): Payer: Medicare Other

## 2019-09-13 DIAGNOSIS — E7800 Pure hypercholesterolemia, unspecified: Secondary | ICD-10-CM

## 2019-09-13 DIAGNOSIS — I10 Essential (primary) hypertension: Secondary | ICD-10-CM

## 2019-09-13 DIAGNOSIS — R739 Hyperglycemia, unspecified: Secondary | ICD-10-CM | POA: Diagnosis not present

## 2019-09-13 LAB — LIPID PANEL
Cholesterol: 189 mg/dL (ref 0–200)
HDL: 63.7 mg/dL (ref >39.00)
LDL Cholesterol: 110 mg/dL — ABNORMAL HIGH (ref 0–99)
NonHDL: 124.98
Total CHOL/HDL Ratio: 3
Triglycerides: 73 mg/dL (ref 0.0–149.0)
VLDL: 14.6 mg/dL (ref 0.0–40.0)

## 2019-09-13 LAB — HEPATIC FUNCTION PANEL
ALT: 12 U/L (ref 0–35)
AST: 17 U/L (ref 0–37)
Albumin: 4.3 g/dL (ref 3.5–5.2)
Alkaline Phosphatase: 65 U/L (ref 39–117)
Bilirubin, Direct: 0.1 mg/dL (ref 0.0–0.3)
Total Bilirubin: 0.6 mg/dL (ref 0.2–1.2)
Total Protein: 6.7 g/dL (ref 6.0–8.3)

## 2019-09-13 LAB — BASIC METABOLIC PANEL
BUN: 17 mg/dL (ref 6–23)
CO2: 32 mEq/L (ref 19–32)
Calcium: 9.5 mg/dL (ref 8.4–10.5)
Chloride: 103 mEq/L (ref 96–112)
Creatinine, Ser: 0.71 mg/dL (ref 0.40–1.20)
GFR: 82.1 mL/min (ref >60.00)
Glucose, Bld: 91 mg/dL (ref 70–99)
Potassium: 4.7 mEq/L (ref 3.5–5.1)
Sodium: 142 mEq/L (ref 135–145)

## 2019-09-13 LAB — HEMOGLOBIN A1C: Hgb A1c MFr Bld: 5.6 % (ref 4.6–6.5)

## 2019-09-14 ENCOUNTER — Encounter: Payer: Self-pay | Admitting: Internal Medicine

## 2019-09-14 ENCOUNTER — Telehealth: Payer: Self-pay | Admitting: Internal Medicine

## 2019-09-14 NOTE — Telephone Encounter (Signed)
Pt left her previous appt without scheduling her 6 month f/u.  Needs a f/u appt scheduled at end of 01/2020.

## 2019-09-16 NOTE — Telephone Encounter (Signed)
Pt scheduled  

## 2019-09-26 DIAGNOSIS — M9903 Segmental and somatic dysfunction of lumbar region: Secondary | ICD-10-CM | POA: Diagnosis not present

## 2019-09-26 DIAGNOSIS — M955 Acquired deformity of pelvis: Secondary | ICD-10-CM | POA: Diagnosis not present

## 2019-09-26 DIAGNOSIS — M5442 Lumbago with sciatica, left side: Secondary | ICD-10-CM | POA: Diagnosis not present

## 2019-09-26 DIAGNOSIS — M9902 Segmental and somatic dysfunction of thoracic region: Secondary | ICD-10-CM | POA: Diagnosis not present

## 2019-09-26 DIAGNOSIS — M9905 Segmental and somatic dysfunction of pelvic region: Secondary | ICD-10-CM | POA: Diagnosis not present

## 2019-10-09 ENCOUNTER — Other Ambulatory Visit: Payer: Self-pay

## 2019-10-09 MED ORDER — DILTIAZEM HCL ER COATED BEADS 120 MG PO CP24: 120.0000 mg | ORAL_CAPSULE | Freq: Every day | ORAL | 3 refills | Status: DC

## 2019-10-09 NOTE — Telephone Encounter (Signed)
*  STAT* If patient is at the pharmacy, call can be transferred to refill team.   1. Which medications need to be refilled? (please list name of each medication and dose if known) Cardizem  2. Which pharmacy/location (including street and city if local pharmacy) is medication to be sent to? [facility] [place]  3. Do they need a 30 day or 90 day supply? [place]

## 2019-10-15 ENCOUNTER — Emergency Department: Payer: Medicare Other

## 2019-10-15 ENCOUNTER — Emergency Department
Admission: EM | Admit: 2019-10-15 | Discharge: 2019-10-15 | Disposition: A | Discharge status: Home / Self Care | Payer: Medicare Other | Attending: Emergency Medicine | Admitting: Emergency Medicine

## 2019-10-15 ENCOUNTER — Other Ambulatory Visit: Payer: Self-pay

## 2019-10-15 DIAGNOSIS — I4891 Unspecified atrial fibrillation: Secondary | ICD-10-CM

## 2019-10-15 DIAGNOSIS — R42 Dizziness and giddiness: Secondary | ICD-10-CM | POA: Insufficient documentation

## 2019-10-15 DIAGNOSIS — Z7901 Long term (current) use of anticoagulants: Secondary | ICD-10-CM | POA: Diagnosis not present

## 2019-10-15 DIAGNOSIS — I10 Essential (primary) hypertension: Secondary | ICD-10-CM | POA: Insufficient documentation

## 2019-10-15 DIAGNOSIS — Z79899 Other long term (current) drug therapy: Secondary | ICD-10-CM | POA: Diagnosis not present

## 2019-10-15 DIAGNOSIS — R002 Palpitations: Secondary | ICD-10-CM | POA: Diagnosis not present

## 2019-10-15 HISTORY — DX: Unspecified atrial fibrillation: I48.91

## 2019-10-15 LAB — URINALYSIS, COMPLETE (UACMP) WITH MICROSCOPIC
Bilirubin Urine: NEGATIVE
Glucose, UA: NEGATIVE mg/dL
Ketones, ur: NEGATIVE mg/dL
Leukocytes,Ua: NEGATIVE
Nitrite: NEGATIVE
Protein, ur: NEGATIVE mg/dL
Specific Gravity, Urine: 1.005 (ref 1.005–1.030)
Squamous Epithelial / HPF: NONE SEEN (ref 0–5)
pH: 7 (ref 5.0–8.0)

## 2019-10-15 LAB — CBC
HCT: 42.3 % (ref 36.0–46.0)
Hemoglobin: 14.2 g/dL (ref 12.0–15.0)
MCH: 30.8 pg (ref 26.0–34.0)
MCHC: 33.6 g/dL (ref 30.0–36.0)
MCV: 91.8 fL (ref 80.0–100.0)
Platelets: 257 10*3/uL (ref 150–400)
RBC: 4.61 MIL/uL (ref 3.87–5.11)
RDW: 12.2 % (ref 11.5–15.5)
WBC: 5.2 10*3/uL (ref 4.0–10.5)
nRBC: 0 % (ref 0.0–0.2)

## 2019-10-15 LAB — T4, FREE: Free T4: 0.95 ng/dL (ref 0.61–1.12)

## 2019-10-15 LAB — BASIC METABOLIC PANEL
Anion gap: 7 (ref 5–15)
BUN: 14 mg/dL (ref 8–23)
CO2: 30 mmol/L (ref 22–32)
Calcium: 9.2 mg/dL (ref 8.9–10.3)
Chloride: 103 mmol/L (ref 98–111)
Creatinine, Ser: 0.61 mg/dL (ref 0.44–1.00)
GFR calc Af Amer: >60 mL/min (ref >60)
GFR calc non Af Amer: >60 mL/min (ref >60)
Glucose, Bld: 123 mg/dL — ABNORMAL HIGH (ref 70–99)
Potassium: 3.4 mmol/L — ABNORMAL LOW (ref 3.5–5.1)
Sodium: 140 mmol/L (ref 135–145)

## 2019-10-15 LAB — URINE DRUG SCREEN, QUALITATIVE (ARMC ONLY)
Amphetamines, Ur Screen: NOT DETECTED
Barbiturates, Ur Screen: NOT DETECTED
Benzodiazepine, Ur Scrn: NOT DETECTED
Cannabinoid 50 Ng, Ur ~~LOC~~: NOT DETECTED
Cocaine Metabolite,Ur ~~LOC~~: NOT DETECTED
MDMA (Ecstasy)Ur Screen: NOT DETECTED
Methadone Scn, Ur: NOT DETECTED
Opiate, Ur Screen: NOT DETECTED
Phencyclidine (PCP) Ur S: NOT DETECTED
Tricyclic, Ur Screen: NOT DETECTED

## 2019-10-15 LAB — TROPONIN I (HIGH SENSITIVITY)
Troponin I (High Sensitivity): 5 ng/L (ref <18)
Troponin I (High Sensitivity): 6 ng/L (ref <18)

## 2019-10-15 LAB — TSH: TSH: 1.363 u[IU]/mL (ref 0.350–4.500)

## 2019-10-15 MED ORDER — SODIUM CHLORIDE 0.9 % IV BOLUS
1000.0000 mL | Freq: Once | INTRAVENOUS | Status: AC
  Administered 2019-10-15: 1000 mL via INTRAVENOUS

## 2019-10-15 MED ORDER — DILTIAZEM HCL ER COATED BEADS 120 MG PO CP24
120.0000 mg | ORAL_CAPSULE | Freq: Once | ORAL | Status: AC
  Administered 2019-10-15: 120 mg via ORAL
  Filled 2019-10-15: qty 1

## 2019-10-15 MED ORDER — DILTIAZEM HCL 25 MG/5ML IV SOLN
10.0000 mg | Freq: Once | INTRAVENOUS | Status: AC
  Administered 2019-10-15: 10 mg via INTRAVENOUS
  Filled 2019-10-15: qty 5

## 2019-10-15 NOTE — ED Provider Notes (Signed)
Vitals:   10/15/19 0600 10/15/19 0630  BP: 133/90 118/72  Pulse: (!) 119 80  Resp: 17 (!) 21  Temp:    SpO2: 99% 98%    Repeat troponin testing reassuring.  Patient asymptomatic.  Has appointment tomorrow with cardiology at 3:20 PM for follow-up.  Return precautions and treatment recommendations and follow-up discussed with the patient who is agreeable with the plan.    Delman Kitten, MD 10/15/19 [phone]

## 2019-10-15 NOTE — ED Notes (Signed)
AAOx3.  Skin warm and dry.  NAD 

## 2019-10-15 NOTE — ED Triage Notes (Signed)
Patient c/o palpitations when waking this am. Patient reports lightheadedness, denies chest pain/discomfort.

## 2019-10-15 NOTE — ED Provider Notes (Signed)
[facility] Emergency Department Provider Note  ____________________________________________   First MD Initiated Contact with Patient 10/15/19 [phone]     (approximate)  I have reviewed the triage vital signs and the nursing notes.  History  Chief Complaint Palpitations    HPI Allison [name] is a 68 y.o. female with history of paroxysmal atrial fibrillation, who presents to the emergency department for palpitations, AF with RVR.  Patient states she woke up this morning around 5 AM to use the restroom.  Shortly after waking up she noticed palpitations, knew she was in atrial fibrillation given her history.  She was otherwise feeling well yesterday prior to going to bed.  She has some associated lightheadedness, but no loss of consciousness.  She denies any chest pain, shortness of breath, difficulty breathing.  She denies any recent illnesses, infections, fevers, nausea, vomiting, or sick contacts.  She denies any heavy alcohol use.  She reports compliance with her medications.  She did take a dose of her PRN PO diltiazem 30 mg prior to arrival, prescribed as needed for episodes of palpitations.  She reports compliance with her Xarelto and metoprolol, took both last night. Has not taken her extended release diltiazem yet this AM.   Past Medical Hx Past Medical History:  Diagnosis Date  . A-fib ([place])   . GERD (gastroesophageal reflux disease)   . Hypercholesterolemia   . Hypertension   . Migraine headache   . Psoriasis     Problem List Patient Active Problem List   Diagnosis Date Noted  . Hyperglycemia 05/13/2019  . Paroxysmal atrial fibrillation ([place]) 02/03/2019  . Loose stools 07/23/2017  . Weight loss 07/23/2017  . 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 LLQ abdominal pain 09/14/2016  . Diverticulosis 04/18/2016  . Right shoulder pain 01/24/2016  . Health care maintenance 05/04/2015  . Leg cramps 05/01/2015  . Unspecified constipation 07/13/2014  . Eye  irritation 11/14/2013  . Hypertension 10/15/2012  . Hypercholesterolemia 10/15/2012  . GERD (gastroesophageal reflux disease) 10/15/2012  . Migraine headache 10/15/2012    Past Surgical Hx Past Surgical History:  Procedure Laterality Date  . COLONOSCOPY WITH PROPOFOL N/A 12/23/2016   Procedure: COLONOSCOPY WITH PROPOFOL;  Surgeon: Lollie Sails, MD;  Location: [facility] ENDOSCOPY;  Service: Endoscopy;  Laterality: N/A;  . DILATION AND CURETTAGE OF UTERUS     history of abnormal bleeding  . TUBAL LIGATION      Medications Prior to Admission medications   Medication Sig Start Date End Date Taking? Authorizing Provider  ALPRAZolam Duanne Moron) 0.25 MG tablet TAKE 1 TABLET BY MOUTH ONCE DAILY AS NEEDED 07/10/19   Einar Pheasant, MD  diltiazem (CARDIZEM CD) 120 MG 24 hr capsule Take 1 capsule (120 mg total) by mouth daily. 10/09/19   Minna Merritts, MD  diltiazem (CARDIZEM) 30 MG tablet Take 1 tablet (30 mg total) by mouth 3 (three) times daily as needed (As needed for tachycardia/atrial fib). 02/04/19   Minna Merritts, MD  hyoscyamine (LEVSIN, ANASPAZ) 0.125 MG tablet TAKE 1 TABLET BY MOUTH EVERY 6 HOURS AS NEEDED 12/31/18   Einar Pheasant, MD  metoprolol succinate (TOPROL-XL) 50 MG 24 hr tablet Take 1 tablet (50 mg total) by mouth daily after supper. Take with or immediately following a meal. 02/22/19 07/29/19  Minna Merritts, MD  omeprazole (PRILOSEC) 20 MG capsule Take 1 capsule by mouth once daily 08/27/19   Einar Pheasant, MD  rivaroxaban (XARELTO) 20 MG TABS tablet Take 1 tablet (20 mg total) by  mouth daily with supper. 02/22/19   Minna Merritts, MD  rosuvastatin (CRESTOR) 10 MG tablet Take 1 tablet by mouth once daily 08/09/19   Einar Pheasant, MD    Allergies Iodine  Family Hx Family History  Problem Relation Age of Onset  . Asthma Father   . Diabetes Mother   . CVA Mother   . Psoriasis Mother   . Breast cancer Maternal Grandmother   . Breast cancer Paternal Grandmother     . Colon cancer Maternal Uncle   . Colon cancer Other        nephew  . Diabetes Other        multiple relatives (both sides)    Social Hx Social History   Tobacco Use  . Smoking status: Never Smoker  . Smokeless tobacco: Never Used  Substance Use Topics  . Alcohol use: No    Alcohol/week: 0.0 standard drinks  . Drug use: No     Review of Systems  Constitutional: Negative for fever, chills. Eyes: Negative for visual changes. ENT: Negative for sore throat. Cardiovascular: Negative for chest pain. + palpitations Respiratory: Negative for shortness of breath. Gastrointestinal: Negative for nausea, vomiting.  Genitourinary: Negative for dysuria. Musculoskeletal: Negative for leg swelling. Skin: Negative for rash. Neurological: Negative for for headaches.   Physical Exam  Vital Signs: ED Triage Vitals  Enc Vitals Group     BP 10/15/19 0538 (!) 146/99     Pulse Rate 10/15/19 0538 (!) 122     Resp 10/15/19 0538 17     Temp 10/15/19 0538 98.1 F (36.7 C)     Temp src --      SpO2 10/15/19 0538 99 %     Weight 10/15/19 0537 140 lb (63.5 kg)     Height 10/15/19 0537 5\' 8"  (1.727 m)     Head Circumference --      Peak Flow --      Pain Score 10/15/19 0537 0     Pain Loc --      Pain Edu? --      Excl. in [place]? --     Constitutional: Alert and oriented.  Head: Normocephalic. Atraumatic. Eyes: Conjunctivae clear. Sclera anicteric. Nose: No congestion. No rhinorrhea. Mouth/Throat: Wearing mask.  Neck: No stridor.   Cardiovascular: Tachycardic, irregularly irregular. Extremities well perfused. Respiratory: Normal respiratory effort.  Lungs CTAB. Gastrointestinal: Soft. Non-tender. Non-distended.  Musculoskeletal: No lower extremity edema. No deformities. Neurologic:  Normal speech and language. No gross focal neurologic deficits are appreciated.  Skin: Skin is warm, dry and intact. No rash noted. Psychiatric: Mood and affect are appropriate for  situation.  EKG  Personally reviewed.   Rate: 120s Rhythm: atrial fibrillation Axis: leftward Intervals: WNL AF with RVR Diffuse, minimal ST depressions, likely rate related No STEMI  Repeat at 7:48 AM Rate: within normal limits Rhythm: sinus Axis: leftward Intervals: within normal limits No acute ischemic changes Resolution of ST depressions No STEMI     Radiology  CXR: IMPRESSION:  No acute cardiopulmonary findings.    Procedures  Procedure(s) performed (including critical care):  .Critical Care Performed by: Lilia Pro., MD Authorized by: Lilia Pro., MD   Critical care provider statement:    Critical care time (minutes):  45   Critical care was necessary to treat or prevent imminent or life-threatening deterioration of the following conditions: AF w/ RVR requiring IV rate control.   Critical care was time spent personally by me on the following activities:  Discussions  with consultants, evaluation of patient's response to treatment, examination of patient, ordering and performing treatments and interventions, ordering and review of laboratory studies, ordering and review of radiographic studies, pulse oximetry, re-evaluation of patient's condition, obtaining history from patient or surrogate and review of old charts     Initial Impression / Assessment and Plan / ED Course  68 y.o. female with history of paroxysmal atrial fibrillation who presents to the ED for palpitations, lightheadedness, found on arrival to be in AF with RVR  Ddx: paroxysmal atrial fibrillation (known hx), dehydration, electrolyte abnormality, thyroid abnormality  Will obtain labs, rate control, reassess  XR negative.  Thyroid studies negative.  Electrolytes actionable derangements.  No evidence of urine infection.  HR improved to high 90s, low 100s after dose of IV diltiazem, remains in atrial fibrillation.  Will give her home dose of Cardizem CD and continue to monitor.  If  she remains rate controlled after period of observation, anticipate discharge with close cardiology follow-up. She is already on anticoagulation per her cardiologist.   7:49 AM Patient converted to NSR, confirmed on repeat EKG. HR 65, improvement in previously seen ST depression, likely rate related. Will CTM as planned to ensure HR remains stable, anticipate discharge.    Final Clinical Impression(s) / ED Diagnosis  Final diagnoses:  Atrial fibrillation with RVR ([place])  Palpitations       Note:  This document was prepared using Dragon voice recognition software and may include unintentional dictation errors.   Lilia Pro., MD 10/15/19 [phone]

## 2019-10-15 NOTE — ED Notes (Signed)
Pt on the phone with her daughter.

## 2019-10-15 NOTE — ED Provider Notes (Signed)
Patient is resting comfortably at this time.  Heart rate has improved now to the 90-1 10 range.  Currently awaiting dosing of her oral controlled release diltiazem.  Patient resting comfortably, confirms that she is continuing Xarelto at home.  Sent message informing Dr. Rockey Situ of patient's visit as well, anticipate she will be able to be discharged after study rate control.  Currently asymptomatic   Delman Kitten, MD 10/15/19 [phone]

## 2019-10-16 ENCOUNTER — Encounter: Payer: Self-pay | Admitting: Cardiovascular Disease

## 2019-10-16 ENCOUNTER — Ambulatory Visit (INDEPENDENT_AMBULATORY_CARE_PROVIDER_SITE_OTHER): Payer: Medicare Other | Admitting: Cardiovascular Disease

## 2019-10-16 VITALS — BP 132/78 | HR 65 | Ht 68.0 in | Wt 146.2 lb

## 2019-10-16 DIAGNOSIS — I10 Essential (primary) hypertension: Secondary | ICD-10-CM

## 2019-10-16 DIAGNOSIS — Z79899 Other long term (current) drug therapy: Secondary | ICD-10-CM

## 2019-10-16 DIAGNOSIS — I480 Paroxysmal atrial fibrillation: Secondary | ICD-10-CM

## 2019-10-16 DIAGNOSIS — E785 Hyperlipidemia, unspecified: Secondary | ICD-10-CM

## 2019-10-16 MED ORDER — POTASSIUM CHLORIDE ER 10 MEQ PO TBCR: 10.0000 meq | EXTENDED_RELEASE_TABLET | Freq: Every day | ORAL | 3 refills | Status: DC

## 2019-10-16 NOTE — Progress Notes (Signed)
Cardiology Office Note   Date:  10/16/2019   ID:  Allison [name], DOB 05-06-1951, MRN [id number]  PCP:  Einar Pheasant, MD  Cardiologist:  Dr. Rockey Situ  Chief Complaint  Patient presents with  . other    Follow up from [facility] ER; A-flutter. Meds reviewed by the pt. verbally. "doing well."       History of Present Illness: Allison [name] is a 68 y.o. female who presents for a follow-up visit regarding paroxysmal atrial fibrillation.  She has chronic medical conditions including essential hypertension, hyperlipidemia and GERD. She is on long-term anticoagulation with Xarelto and has been on metoprolol and diltiazem.  She had an echocardiogram done in July 2020 which showed an EF of 55 to 60% with no significant valvular abnormalities. She went to the emergency room yesterday with palpitations and was found to be in A. fib with RVR.  She was given IV diltiazem and converted to normal sinus rhythm.  Troponin was normal.  Her labs were unremarkable except for hypokalemia with a potassium 3.4.  She reports previous history of hypokalemia even though she does not take any diuretics.  Otherwise she has been doing well with no chest pain or shortness of breath.  She takes her medications regularly.  Past Medical History:  Diagnosis Date  . A-fib ([place])   . GERD (gastroesophageal reflux disease)   . Hypercholesterolemia   . Hypertension   . Migraine headache   . Psoriasis     Past Surgical History:  Procedure Laterality Date  . COLONOSCOPY WITH PROPOFOL N/A 12/23/2016   Procedure: COLONOSCOPY WITH PROPOFOL;  Surgeon: Lollie Sails, MD;  Location: [facility] ENDOSCOPY;  Service: Endoscopy;  Laterality: N/A;  . DILATION AND CURETTAGE OF UTERUS     history of abnormal bleeding  . TUBAL LIGATION       Current Outpatient Medications  Medication Sig Dispense Refill  . ALPRAZolam (XANAX) 0.25 MG tablet TAKE 1 TABLET BY MOUTH ONCE DAILY AS NEEDED 30 tablet 0  . diltiazem (CARDIZEM  CD) 120 MG 24 hr capsule Take 1 capsule (120 mg total) by mouth daily. 90 capsule 3  . diltiazem (CARDIZEM) 30 MG tablet Take 1 tablet (30 mg total) by mouth 3 (three) times daily as needed (As needed for tachycardia/atrial fib). 90 tablet 3  . hyoscyamine (LEVSIN, ANASPAZ) 0.125 MG tablet TAKE 1 TABLET BY MOUTH EVERY 6 HOURS AS NEEDED 30 tablet 0  . metoprolol succinate (TOPROL-XL) 50 MG 24 hr tablet Take 1 tablet (50 mg total) by mouth daily after supper. Take with or immediately following a meal. 90 tablet 3  . omeprazole (PRILOSEC) 20 MG capsule Take 1 capsule by mouth once daily 90 capsule 3  . rivaroxaban (XARELTO) 20 MG TABS tablet Take 1 tablet (20 mg total) by mouth daily with supper. 30 tablet 11  . rosuvastatin (CRESTOR) 10 MG tablet Take 1 tablet by mouth once daily 90 tablet 0   No current facility-administered medications for this visit.    Allergies:   Iodine    Social History:  The patient  reports that she has never smoked. She has never used smokeless tobacco. She reports that she does not drink alcohol or use drugs.   Family History:  The patient's family history includes Asthma in her father; Breast cancer in her maternal grandmother and paternal grandmother; CVA in her mother; Colon cancer in her maternal uncle and another family member; Diabetes in her mother and another family member; Psoriasis in  her mother.    ROS:  Please see the history of present illness.   Otherwise, review of systems are positive for none.   All other systems are reviewed and negative.    PHYSICAL EXAM: VS:  BP 132/78 (BP Location: Left Arm, Patient Position: Sitting, Cuff Size: Normal)   Pulse 65   Ht 5\' 8"  (1.727 m)   Wt 146 lb 4 oz (66.3 kg)   BMI 22.24 kg/m  , BMI Body mass index is 22.24 kg/m. GEN: Well nourished, well developed, in no acute distress  HEENT: normal  Neck: no JVD, carotid bruits, or masses Cardiac: RRR; no murmurs, rubs, or gallops,no edema  Respiratory:  clear to  auscultation bilaterally, normal work of breathing GI: soft, nontender, nondistended, + BS MS: no deformity or atrophy  Skin: warm and dry, no rash Neuro:  Strength and sensation are intact Psych: euthymic mood, full affect   EKG:  EKG is ordered today. The ekg ordered today demonstrates normal sinus rhythm with incomplete right bundle branch block and nonspecific ST changes.   Recent Labs: 09/13/2019: ALT 12 10/15/2019: BUN 14; Creatinine, Ser 0.61; Hemoglobin 14.2; Platelets 257; Potassium 3.4; Sodium 140; TSH 1.363    Lipid Panel    Component Value Date/Time   CHOL 189 09/13/2019 0926   TRIG 73.0 09/13/2019 0926   HDL 63.70 09/13/2019 0926   CHOLHDL 3 09/13/2019 0926   VLDL 14.6 09/13/2019 0926   LDLCALC 110 (H) 09/13/2019 0926   LDLCALC 103 (H) 07/27/2018 1552      Wt Readings from Last 3 Encounters:  10/16/19 146 lb 4 oz (66.3 kg)  10/15/19 140 lb (63.5 kg)  08/23/19 145 lb 12.8 oz (66.1 kg)       No flowsheet data found.    ASSESSMENT AND PLAN:  1.  Paroxysmal atrial fibrillation: The patient had her second episode of atrial fibrillation this year but converted to sinus rhythm.  Recommend continuing treatment with diltiazem and metoprolol.  She is on long-term anticoagulation with Xarelto.  She was mildly hypokalemic which might have contributed.  I elected to add small dose of potassium chloride and recheck potassium level in 2 weeks. If episodes of atrial fibrillation become more frequent, we should consider an antiarrhythmic medication such as class Ic drugs or referral for ablation.  2.  Essential hypertension: Blood pressure is controlled on current medications.  3.  Hyperlipidemia: She is currently on rosuvastatin with most recent LDL of 110.  4.  Aortic atherosclerosis: Stable.  Disposition:   FU with Dr. Candis Musa in 6 months.  Signed,  Kathlyn Sacramento, MD  10/16/2019 3:21 PM    [place]

## 2019-10-16 NOTE — Patient Instructions (Addendum)
Medication Instructions:  - Your physician has recommended you make the following change in your medication:   1) Start potassium 10 meq- take 1 tablet (10 meq) by mouth once daily  *If you need a refill on your cardiac medications before your next appointment, please call your pharmacy*  Lab Work: - Your physician recommends that you return for lab work in: 2 weeks- BMP  - please come to the [place] entrance at [facility], 1st desk on the right (registration) to check in - Lab hours: Monday- Friday (7:30 am- 5:30 pm) - you do not have to fast  If you have labs (blood work) drawn today and your tests are completely normal, you will receive your results only by: Marland Kitchen MyChart Message (if you have MyChart) OR . A paper copy in the mail If you have any lab test that is abnormal or we need to change your treatment, we will call you to review the results.  Testing/Procedures: - none ordered  Follow-Up: At [facility], you and your health needs are our priority.  As part of our continuing mission to provide you with exceptional heart care, we have created designated Provider Care Teams.  These Care Teams include your primary Cardiologist (physician) and Advanced Practice Providers (APPs -  Physician Assistants and Nurse Practitioners) who all work together to provide you with the care you need, when you need it.  Your next appointment:   6 month(s)  The format for your next appointment:   In Person  Provider:    You may see Ida Rogue, MD or one of the following Advanced Practice Providers on your designated Care Team:    Murray Hodgkins, NP  Christell Faith, PA-C  Marrianne Mood, PA-C   Other Instructions n/a

## 2019-10-30 ENCOUNTER — Other Ambulatory Visit
Admission: RE | Admit: 2019-10-30 | Discharge: 2019-10-30 | Disposition: A | Discharge status: Home / Self Care | Payer: Medicare Other | Attending: Cardiovascular Disease | Admitting: Cardiovascular Disease

## 2019-10-30 DIAGNOSIS — Z79899 Other long term (current) drug therapy: Secondary | ICD-10-CM | POA: Diagnosis not present

## 2019-10-30 DIAGNOSIS — I10 Essential (primary) hypertension: Secondary | ICD-10-CM | POA: Diagnosis not present

## 2019-10-30 LAB — BASIC METABOLIC PANEL
Anion gap: 9 (ref 5–15)
BUN: 16 mg/dL (ref 8–23)
CO2: 27 mmol/L (ref 22–32)
Calcium: 9.2 mg/dL (ref 8.9–10.3)
Chloride: 103 mmol/L (ref 98–111)
Creatinine, Ser: 0.65 mg/dL (ref 0.44–1.00)
GFR calc Af Amer: >60 mL/min (ref >60)
GFR calc non Af Amer: >60 mL/min (ref >60)
Glucose, Bld: 121 mg/dL — ABNORMAL HIGH (ref 70–99)
Potassium: 4 mmol/L (ref 3.5–5.1)
Sodium: 139 mmol/L (ref 135–145)

## 2019-11-03 ENCOUNTER — Other Ambulatory Visit: Payer: Self-pay | Admitting: Internal Medicine

## 2019-11-04 DIAGNOSIS — Z20822 Contact with and (suspected) exposure to covid-19: Secondary | ICD-10-CM | POA: Diagnosis not present

## 2019-11-06 ENCOUNTER — Telehealth: Payer: Self-pay | Admitting: Cardiovascular Disease

## 2019-11-06 NOTE — Telephone Encounter (Signed)
Patient has tested positive for Covid and is now calling in regarding taking some vitamins.Patient does not want to interfere with her prescribed medications.   Please advise when able

## 2019-11-07 NOTE — Telephone Encounter (Signed)
Spoke to patient and she would like to make sure it is ok to take a Calcium, magnesium, Zinc combination pill and with her current medications. She is not showing any symptoms of COVID at this time but would like to take the vitamin to help herself with fighting the potential symptoms of the illness if they develop. Advised that I thought it should be ok but will check with the pharmacist just in case. She was appreciative.

## 2019-11-08 NOTE — Telephone Encounter (Signed)
Okay to take vitamin supplement with current prescribed medication. No significant interaction expected.

## 2019-11-08 NOTE — Telephone Encounter (Signed)
Patient notified and verbalized understanding of recommendations and was appreciative.

## 2019-12-09 ENCOUNTER — Telehealth: Payer: Self-pay | Admitting: Internal Medicine

## 2019-12-09 NOTE — Telephone Encounter (Signed)
Left message for patient to call back and schedule Medicare Annual Wellness Visit (AWV) either virtually or audio only.  No hx of AWV; please schedule at anytime with Denisa O'Brien-Blaney at [place]   

## 2019-12-14 ENCOUNTER — Other Ambulatory Visit: Payer: Self-pay | Admitting: Internal Medicine

## 2020-01-13 ENCOUNTER — Other Ambulatory Visit: Payer: Self-pay

## 2020-01-13 ENCOUNTER — Ambulatory Visit: Payer: Medicare Other | Attending: Internal Medicine

## 2020-01-13 DIAGNOSIS — Z23 Encounter for immunization: Secondary | ICD-10-CM

## 2020-01-13 NOTE — Progress Notes (Signed)
   Covid-19 Vaccination Clinic  Name:  Allison [name]    MRN: [id number] DOB: 15-Mar-1951  01/13/2020  [name] was observed post Covid-19 immunization for 15 minutes without incident. She was provided with Vaccine Information Sheet and instruction to access the V-Safe system.   [name] was instructed to call 911 with any severe reactions post vaccine: Marland Kitchen Difficulty breathing  . Swelling of face and throat  . A fast heartbeat  . A bad rash all over body  . Dizziness and weakness   Immunizations Administered    Name Date Dose VIS Date Route   Pfizer COVID-19 Vaccine 01/13/2020 11:05 AM 0.3 mL 10/04/2019 Intramuscular   Manufacturer: [place]   Lot: [id number]   [place]: [id number]

## 2020-01-21 ENCOUNTER — Other Ambulatory Visit: Payer: Self-pay | Admitting: Internal Medicine

## 2020-01-21 NOTE — Telephone Encounter (Signed)
Refill request for Xanax, last seen 08-31-19, last filled 12-16-19.  Please advise.

## 2020-01-21 NOTE — Telephone Encounter (Signed)
Need to clarify if pt needs rx.  In reviewing her history, she has not needed xanax that often.  She just had this refilled 12/16/19 and now requesting another refill.  Regarding timing, I know she can get another refill, but need to make sure she is doing ok.  She previously has not been taking xanax on a regular basis.  Just need to clarify she is ok and that she does need rx.

## 2020-01-21 NOTE — Telephone Encounter (Signed)
Called patient. Unable to leave message.

## 2020-01-23 DIAGNOSIS — Z86018 Personal history of other benign neoplasm: Secondary | ICD-10-CM | POA: Diagnosis not present

## 2020-01-23 DIAGNOSIS — D2261 Melanocytic nevi of right upper limb, including shoulder: Secondary | ICD-10-CM | POA: Diagnosis not present

## 2020-01-23 DIAGNOSIS — L578 Other skin changes due to chronic exposure to nonionizing radiation: Secondary | ICD-10-CM | POA: Diagnosis not present

## 2020-01-23 DIAGNOSIS — L118 Other specified acantholytic disorders: Secondary | ICD-10-CM | POA: Diagnosis not present

## 2020-01-23 DIAGNOSIS — L400 Psoriasis vulgaris: Secondary | ICD-10-CM | POA: Diagnosis not present

## 2020-01-23 DIAGNOSIS — D485 Neoplasm of uncertain behavior of skin: Secondary | ICD-10-CM | POA: Diagnosis not present

## 2020-01-23 DIAGNOSIS — L570 Actinic keratosis: Secondary | ICD-10-CM | POA: Diagnosis not present

## 2020-01-23 NOTE — Telephone Encounter (Signed)
Patient stated that she does need a refill but not anything urgent. Doing ok. She had stopped getting it frequently for awhile when she stopped driving long distances but now she is back at work so she is having to use more often. No increased issues, and does not use every day. She still has about 10-12 pills left from last refill. She just requested refill when she was going through her meds checking refills. She has f/u on 04/30.

## 2020-02-05 ENCOUNTER — Telehealth: Payer: Self-pay | Admitting: Cardiovascular Disease

## 2020-02-05 ENCOUNTER — Other Ambulatory Visit: Payer: Self-pay

## 2020-02-05 ENCOUNTER — Ambulatory Visit: Payer: Medicare Other | Attending: Internal Medicine

## 2020-02-05 DIAGNOSIS — Z23 Encounter for immunization: Secondary | ICD-10-CM

## 2020-02-05 MED ORDER — RIVAROXABAN 20 MG PO TABS: 20.0000 mg | ORAL_TABLET | Freq: Every day | ORAL | 3 refills | Status: DC

## 2020-02-05 NOTE — Progress Notes (Signed)
   Covid-19 Vaccination Clinic  Name:  TYREKA SZELIGA    MRN: [id number] DOB: [birth year]  02/05/2020  Ms. Cotherman was observed post Covid-19 immunization for 15 minutes without incident. She was provided with Vaccine Information Sheet and instruction to access the V-Safe system.   Ms. Lanthier was instructed to call 911 with any severe reactions post vaccine: Marland Kitchen Difficulty breathing  . Swelling of face and throat  . A fast heartbeat  . A bad rash all over body  . Dizziness and weakness   Immunizations Administered    Name Date Dose VIS Date Route   Pfizer COVID-19 Vaccine 02/05/2020 11:54 AM 0.3 mL 10/04/2019 Intramuscular   Manufacturer: [place]   Lot: [id number]   [place]: [id number]

## 2020-02-05 NOTE — Telephone Encounter (Signed)
*  STAT* If patient is at the pharmacy, call can be transferred to refill team.   1. Which medications need to be refilled? (please list name of each medication and dose if known) Xarelto 20 mg  2. Which pharmacy/location (including street and city if local pharmacy) is medication to be sent to? [facility] on [address]   3. Do they need a 30 day or 90 day supply? [place]

## 2020-02-05 NOTE — Telephone Encounter (Signed)
Patient called and was wondering if her upcoming appointment with Dr. Fletcher Anon should be kept or changed to Dr. Rockey Situ. Originally she is a Art therapist patient but due to a hospital encounter for the doctors the patient say Dr. Fletcher Anon in December.  Please advise patient on what is most appropriate. Patient does have a f/u scheduled with Fletcher Anon already

## 2020-02-06 NOTE — Telephone Encounter (Signed)
LMOV to switch

## 2020-02-06 NOTE — Telephone Encounter (Signed)
No answer. No voicemail. 

## 2020-02-06 NOTE — Telephone Encounter (Signed)
Spoke with patient and she states that she has always seen Dr. Rockey Situ. She mentioned that the only time she saw Dr. Fletcher Anon was in the hospital once and then in December but that she really would like to stay with Dr. Rockey Situ. Advised that I would send message to scheduling to reach out and switch her appointment. She verbalized understanding, was agreeable with plan, and had no further questions at this time.

## 2020-02-07 ENCOUNTER — Other Ambulatory Visit: Payer: Self-pay | Admitting: Internal Medicine

## 2020-02-10 ENCOUNTER — Encounter: Payer: Self-pay | Admitting: Internal Medicine

## 2020-02-20 DIAGNOSIS — M25561 Pain in right knee: Secondary | ICD-10-CM | POA: Diagnosis not present

## 2020-02-20 DIAGNOSIS — M7581 Other shoulder lesions, right shoulder: Secondary | ICD-10-CM | POA: Diagnosis not present

## 2020-02-20 DIAGNOSIS — M1711 Unilateral primary osteoarthritis, right knee: Secondary | ICD-10-CM | POA: Diagnosis not present

## 2020-02-20 DIAGNOSIS — M25511 Pain in right shoulder: Secondary | ICD-10-CM | POA: Diagnosis not present

## 2020-02-20 DIAGNOSIS — M7541 Impingement syndrome of right shoulder: Secondary | ICD-10-CM | POA: Diagnosis not present

## 2020-02-21 ENCOUNTER — Ambulatory Visit: Payer: Medicare Other | Admitting: Internal Medicine

## 2020-02-21 DIAGNOSIS — M25511 Pain in right shoulder: Secondary | ICD-10-CM | POA: Diagnosis not present

## 2020-02-21 DIAGNOSIS — M7581 Other shoulder lesions, right shoulder: Secondary | ICD-10-CM | POA: Diagnosis not present

## 2020-02-21 DIAGNOSIS — M7541 Impingement syndrome of right shoulder: Secondary | ICD-10-CM | POA: Diagnosis not present

## 2020-02-21 DIAGNOSIS — M25561 Pain in right knee: Secondary | ICD-10-CM | POA: Diagnosis not present

## 2020-02-26 DIAGNOSIS — D2361 Other benign neoplasm of skin of right upper limb, including shoulder: Secondary | ICD-10-CM | POA: Diagnosis not present

## 2020-03-01 ENCOUNTER — Other Ambulatory Visit: Payer: Self-pay | Admitting: Internal Medicine

## 2020-03-02 NOTE — Telephone Encounter (Signed)
Refill request for xanax, last seen 08-23-19, last filled 01-25-20.  Please advise.

## 2020-03-03 NOTE — Telephone Encounter (Signed)
Reviewed PDMP.  Refilled xanax x 1.  Needs to keep appt on 03/13/20

## 2020-03-13 ENCOUNTER — Ambulatory Visit (INDEPENDENT_AMBULATORY_CARE_PROVIDER_SITE_OTHER): Payer: Medicare Other | Admitting: Internal Medicine

## 2020-03-13 ENCOUNTER — Other Ambulatory Visit: Payer: Self-pay

## 2020-03-13 DIAGNOSIS — I480 Paroxysmal atrial fibrillation: Secondary | ICD-10-CM | POA: Diagnosis not present

## 2020-03-13 DIAGNOSIS — K219 Gastro-esophageal reflux disease without esophagitis: Secondary | ICD-10-CM

## 2020-03-13 DIAGNOSIS — R634 Abnormal weight loss: Secondary | ICD-10-CM

## 2020-03-13 DIAGNOSIS — G43809 Other migraine, not intractable, without status migrainosus: Secondary | ICD-10-CM | POA: Diagnosis not present

## 2020-03-13 DIAGNOSIS — R739 Hyperglycemia, unspecified: Secondary | ICD-10-CM | POA: Diagnosis not present

## 2020-03-13 DIAGNOSIS — I10 Essential (primary) hypertension: Secondary | ICD-10-CM

## 2020-03-13 DIAGNOSIS — E7800 Pure hypercholesterolemia, unspecified: Secondary | ICD-10-CM

## 2020-03-13 NOTE — Progress Notes (Signed)
Patient ID: Allison [name], female   DOB: 10-Dec-1950, 69 y.o.   MRN: [id number]   Subjective:    Patient ID: Allison [name], female    DOB: [birth year], 69 y.o.   MRN: [id number]  HPI This visit occurred during the SARS-CoV-2 public health emergency.  Safety protocols were in place, including screening questions prior to the visit, additional usage of staff PPE, and extensive cleaning of exam room while observing appropriate contact time as indicated for disinfecting solutions.  Patient here for a scheduled follow up.  She is back at work.  Handling stress.  Planning to get a crown on her tooth next week.  Has f/u with Dr Rockey Situ 04/2020.  Sees him for f/u afib.  She feels from a cardiac standpoint - has been stable.  No chest pain.  Occasionally will notice chest tightness.  Tries to stay active.  Resolves without intervention.  Discussed cardiology evaluation, including EKG today.  She declines.  Wants to monitor.  Breathing stable.  Has occasional episodes of increased heart rate.  Has diltiazem to take prn.  Takes metoprolol on a scheduled basis.  Just had biopdy - right shoulder.  States ok.  Follows with dermatology.  No acid reflux.  No abdominal pain.  No urine or bowel change.  Off relpax.  Headaches not an issue for her.    Past Medical History:  Diagnosis Date  . A-fib ([place])   . GERD (gastroesophageal reflux disease)   . Hypercholesterolemia   . Hypertension   . Migraine headache   . Psoriasis    Past Surgical History:  Procedure Laterality Date  . COLONOSCOPY WITH PROPOFOL N/A 12/23/2016   Procedure: COLONOSCOPY WITH PROPOFOL;  Surgeon: Lollie Sails, MD;  Location: [facility] ENDOSCOPY;  Service: Endoscopy;  Laterality: N/A;  . DILATION AND CURETTAGE OF UTERUS     history of abnormal bleeding  . TUBAL LIGATION     Family History  Problem Relation Age of Onset  . Asthma Father   . Diabetes Mother   . CVA Mother   . Psoriasis Mother   . Breast cancer Maternal Grandmother    . Breast cancer Paternal Grandmother   . Colon cancer Maternal Uncle   . Colon cancer Other        nephew  . Diabetes Other        multiple relatives (both sides)   Social History   Socioeconomic History  . Marital status: Single    Spouse name: Not on file  . Number of children: 1  . Years of education: Not on file  . Highest education level: Not on file  Occupational History  . Not on file  Tobacco Use  . Smoking status: Never Smoker  . Smokeless tobacco: Never Used  Substance and Sexual Activity  . Alcohol use: No    Alcohol/week: 0.0 standard drinks  . Drug use: No  . Sexual activity: Not on file  Other Topics Concern  . Not on file  Social History Narrative  . Not on file   Social Determinants of Health   Financial Resource Strain:   . Difficulty of Paying Living Expenses:   Food Insecurity:   . Worried About Charity fundraiser in the Last Year:   . Arboriculturist in the Last Year:   Transportation Needs:   . Film/video editor (Medical):   Marland Kitchen Lack of Transportation (Non-Medical):   Physical Activity:   . Days of Exercise per Week:   .  Minutes of Exercise per Session:   Stress:   . Feeling of Stress :   Social Connections:   . Frequency of Communication with Friends and Family:   . Frequency of Social Gatherings with Friends and Family:   . Attends Religious Services:   . Active Member of Clubs or Organizations:   . Attends Archivist Meetings:   Marland Kitchen Marital Status:     Outpatient Encounter Medications as of 03/13/2020  Medication Sig  . ALPRAZolam (XANAX) 0.25 MG tablet TAKE 1 TABLET BY MOUTH ONCE DAILY AS NEEDED  . diltiazem (CARDIZEM CD) 120 MG 24 hr capsule Take 1 capsule (120 mg total) by mouth daily.  Marland Kitchen diltiazem (CARDIZEM) 30 MG tablet Take 1 tablet (30 mg total) by mouth 3 (three) times daily as needed (As needed for tachycardia/atrial fib).  . hyoscyamine (LEVSIN, ANASPAZ) 0.125 MG tablet TAKE 1 TABLET BY MOUTH EVERY 6 HOURS AS  NEEDED  . metoprolol succinate (TOPROL-XL) 50 MG 24 hr tablet Take 1 tablet (50 mg total) by mouth daily after supper. Take with or immediately following a meal.  . omeprazole (PRILOSEC) 20 MG capsule Take 1 capsule by mouth once daily  . potassium chloride (KLOR-CON) 10 MEQ tablet Take 1 tablet (10 mEq total) by mouth daily.  . rivaroxaban (XARELTO) 20 MG TABS tablet Take 1 tablet (20 mg total) by mouth daily with supper.  . rosuvastatin (CRESTOR) 10 MG tablet Take 1 tablet by mouth once daily   No facility-administered encounter medications on file as of 03/13/2020.    Review of Systems  Constitutional: Negative for appetite change and unexpected weight change.  HENT: Negative for congestion and sinus pressure.   Respiratory: Negative for cough and shortness of breath.        Occasional chest tightness as outlined.    Cardiovascular: Negative for chest pain and leg swelling.       Occasionally will notice increased heart rate.    Gastrointestinal: Negative for abdominal pain, diarrhea, nausea and vomiting.  Genitourinary: Negative for difficulty urinating and dysuria.  Musculoskeletal: Negative for joint swelling and myalgias.  Skin: Negative for color change and rash.  Neurological: Negative for dizziness, light-headedness and headaches.  Psychiatric/Behavioral: Negative for agitation and dysphoric mood.       Objective:    Physical Exam Vitals reviewed.  Constitutional:      General: She is not in acute distress.    Appearance: Normal appearance.  HENT:     Head: Normocephalic and atraumatic.     Right Ear: External ear normal.     Left Ear: External ear normal.  Eyes:     General: No scleral icterus.       Right eye: No discharge.        Left eye: No discharge.     Conjunctiva/sclera: Conjunctivae normal.  Neck:     Thyroid: No thyromegaly.  Cardiovascular:     Rate and Rhythm: Normal rate and regular rhythm.  Pulmonary:     Effort: No respiratory distress.      Breath sounds: Normal breath sounds. No wheezing.  Abdominal:     General: Bowel sounds are normal.     Palpations: Abdomen is soft.     Tenderness: There is no abdominal tenderness.  Musculoskeletal:        General: No swelling or tenderness.     Cervical back: Neck supple. No tenderness.  Lymphadenopathy:     Cervical: No cervical adenopathy.  Skin:    Findings: No  erythema or rash.  Neurological:     Mental Status: She is alert.  Psychiatric:        Mood and Affect: Mood normal.        Behavior: Behavior normal.     BP 112/70   Pulse (!) 58   Temp 97.6 F (36.4 C)   Resp 16   Ht '5\' 8"'  (1.727 m)   Wt 150 lb (68 kg)   SpO2 98%   BMI 22.81 kg/m  Wt Readings from Last 3 Encounters:  03/13/20 150 lb (68 kg)  10/16/19 146 lb 4 oz (66.3 kg)  10/15/19 140 lb (63.5 kg)     Lab Results  Component Value Date   WBC 5.2 10/15/2019   HGB 14.2 10/15/2019   HCT 42.3 10/15/2019   PLT 257 10/15/2019   GLUCOSE 121 (H) 10/30/2019   CHOL 189 09/13/2019   TRIG 73.0 09/13/2019   HDL 63.70 09/13/2019   LDLCALC 110 (H) 09/13/2019   ALT 12 09/13/2019   AST 17 09/13/2019   NA 139 10/30/2019   K 4.0 10/30/2019   CL 103 10/30/2019   CREATININE 0.65 10/30/2019   BUN 16 10/30/2019   CO2 27 10/30/2019   TSH 1.363 10/15/2019   HGBA1C 5.6 09/13/2019       Assessment & Plan:   Problem List Items Addressed This Visit    GERD (gastroesophageal reflux disease)    No acid reflux.  On omeprazole.  Follow.        Hypercholesterolemia    On pravastatin.  Low cholesterol diet and exercise.  Follow lipid panel and liver function tests.        Relevant Orders   Hepatic function panel   Lipid panel   Hyperglycemia    Follow met b and a1c.       Relevant Orders   Hemoglobin A1c   Hypertension    Blood pressure doing well.  On metoprolol.  Follow pressures.  Follow metabolic panel.       Relevant Orders   Basic metabolic panel   Migraine headache    Not an issue now.  Off  relpax.       Paroxysmal atrial fibrillation (HCC)    On xarelto.  Followed by cardiology.  Taking metoprolol scheduled.  Only takes cardizem prn.  Feels doing well on this regimen.  Pulse rate on exam 58-60.  Hold on making adjustments in medication since doing well.  Follow.  Noticed intermittent chest tightness as outlined.  Does not occur often.  Discussed EKG and further w/up.  She declins.  Wants to monitor.  Will keep f/u with cardiology.  Notify me or be reevaluated if return or worsening symptoms.        Weight loss    Weight is stable.  Up a couple of pounds.  Follow.  States eating well.            Einar Pheasant, MD

## 2020-03-15 ENCOUNTER — Encounter: Payer: Self-pay | Admitting: Internal Medicine

## 2020-03-15 NOTE — Assessment & Plan Note (Signed)
Blood pressure doing well. On metoprolol.  Follow pressures.  Follow metabolic panel.  

## 2020-03-15 NOTE — Assessment & Plan Note (Signed)
On pravastatin.  Low cholesterol diet and exercise.  Follow lipid panel and liver function tests.   

## 2020-03-15 NOTE — Assessment & Plan Note (Signed)
Weight is stable.  Up a couple of pounds.  Follow.  States eating well.

## 2020-03-15 NOTE — Assessment & Plan Note (Signed)
No acid reflux.  On omeprazole.  Follow.

## 2020-03-15 NOTE — Assessment & Plan Note (Signed)
On xarelto.  Followed by cardiology.  Taking metoprolol scheduled.  Only takes cardizem prn.  Feels doing well on this regimen.  Pulse rate on exam 58-60.  Hold on making adjustments in medication since doing well.  Follow.  Noticed intermittent chest tightness as outlined.  Does not occur often.  Discussed EKG and further w/up.  She declins.  Wants to monitor.  Will keep f/u with cardiology.  Notify me or be reevaluated if return or worsening symptoms.

## 2020-03-15 NOTE — Assessment & Plan Note (Signed)
Not an issue now.  Off relpax.

## 2020-03-15 NOTE — Assessment & Plan Note (Signed)
Follow met b and a1c.  

## 2020-04-30 ENCOUNTER — Ambulatory Visit: Payer: Medicare Other | Admitting: Cardiovascular Disease

## 2020-05-02 NOTE — Progress Notes (Signed)
Cardiology Office Note  Date:  05/04/2020   ID:  Allison [name], DOB [birth year], MRN [id number]  PCP:  Einar Pheasant, MD   Chief Complaint  Patient presents with  . OTher    6 month follow up. Patient c.o random chest tightness. Meds reviewed verbally with patient.     HPI:  Allison [name] is a 68 year old woman with past medical history of paroxysmal atrial fibrillation several episodes 01/2019 Essential hypertension Seen in the emergency room January 31, 2019 for Atrial fibrillation Who presents for follow-up of her paroxysmal atrial fibrillation  Telemetry visit Feb 22, 2019, Denied significant arrhythmia at that time, on diltiazem metoprolol Xarelto  Echocardiogram done July 2020, normal LV function, RV function, discussed in detail  Works in Teacher, early years/pre Not much stress at work Has some other stressors, feels she has episodes of anxiety Some atypical type chest pain coming on at rest not with exertion Denies any symptoms on exertion  No palpitations, no atrial fib Takes rare diltiazem 30  (taken 3, more for BP over the past year)  EKG personally reviewed by myself on todays visit Shows sinus bradycardia rate 58 bpm no significant ST-T wave changes  CT of ABD 2017, reviewed with her again today in light of her recent chest pain symptoms Minimal descending aorta atherosclerosis, was described as moderate but there is minimal plaque  No past medical history reviewed Mother with a CVA, age 88 Brother with TIA/CVA Strong family hx of atrial fib  Atrial fib 01/31/2019, 62 Am developed tachycardia Went to the emergency room, EKG showing atrial fibrillation 190 bpm  given diltiazem IV bolus repeat dosing, improved rate down to 130 bpm Discharged on Cardizem CD 120 mg daily  potassium was low at 3.2, on HCTZ   PMH:   has a past medical history of A-fib ([place]), GERD (gastroesophageal reflux disease), Hypercholesterolemia, Hypertension, Migraine headache, and  Psoriasis.  PSH:    Past Surgical History:  Procedure Laterality Date  . COLONOSCOPY WITH PROPOFOL N/A 12/23/2016   Procedure: COLONOSCOPY WITH PROPOFOL;  Surgeon: Lollie Sails, MD;  Location: [facility] ENDOSCOPY;  Service: Endoscopy;  Laterality: N/A;  . DILATION AND CURETTAGE OF UTERUS     history of abnormal bleeding  . TUBAL LIGATION      Current Outpatient Medications  Medication Sig Dispense Refill  . ALPRAZolam (XANAX) 0.25 MG tablet TAKE 1 TABLET BY MOUTH ONCE DAILY AS NEEDED 30 tablet 0  . diltiazem (CARDIZEM CD) 120 MG 24 hr capsule Take 1 capsule (120 mg total) by mouth daily. 90 capsule 3  . diltiazem (CARDIZEM) 30 MG tablet Take 1 tablet (30 mg total) by mouth 3 (three) times daily as needed (As needed for tachycardia/atrial fib). 90 tablet 3  . hyoscyamine (LEVSIN, ANASPAZ) 0.125 MG tablet TAKE 1 TABLET BY MOUTH EVERY 6 HOURS AS NEEDED 30 tablet 0  . metoprolol succinate (TOPROL-XL) 50 MG 24 hr tablet Take 1 tablet (50 mg total) by mouth daily after supper. Take with or immediately following a meal. 90 tablet 3  . omeprazole (PRILOSEC) 20 MG capsule Take 1 capsule by mouth once daily 90 capsule 3  . potassium chloride (KLOR-CON) 10 MEQ tablet Take 1 tablet (10 mEq total) by mouth daily. 90 tablet 3  . rivaroxaban (XARELTO) 20 MG TABS tablet Take 1 tablet (20 mg total) by mouth daily with supper. 90 tablet 3  . rosuvastatin (CRESTOR) 10 MG tablet Take 1 tablet by mouth once daily 90 tablet 0  No current facility-administered medications for this visit.     Allergies:   Iodine   Social History:  The patient  reports that she has never smoked. She has never used smokeless tobacco. She reports that she does not drink alcohol and does not use drugs.   Family History:   family history includes Asthma in her father; Breast cancer in her maternal grandmother and paternal grandmother; CVA in her mother; Colon cancer in her maternal uncle and another family member; Diabetes in her  mother and another family member; Psoriasis in her mother.    Review of Systems: Review of Systems  Constitutional: Negative.   HENT: Negative.   Respiratory: Negative.   Cardiovascular: Negative.   Gastrointestinal: Negative.   Musculoskeletal: Negative.   Neurological: Negative.   Psychiatric/Behavioral: Negative.   All other systems reviewed and are negative.   PHYSICAL EXAM: VS:  BP 114/72 (BP Location: Left Arm, Patient Position: Sitting, Cuff Size: Normal)   Pulse (!) 58   Ht 5\' 8"  (1.727 m)   Wt 152 lb (68.9 kg)   SpO2 96%   BMI 23.11 kg/m  , BMI Body mass index is 23.11 kg/m. Constitutional:  oriented to person, place, and time. No distress.  HENT:  Head: Normocephalic and atraumatic.  Eyes:  no discharge. No scleral icterus.  Neck: Normal range of motion. Neck supple. No JVD present.  Cardiovascular: Normal rate, regular rhythm, normal heart sounds and intact distal pulses. Exam reveals no gallop and no friction rub. No edema No murmur heard. Pulmonary/Chest: Effort normal and breath sounds normal. No stridor. No respiratory distress.  no wheezes.  no rales.  no tenderness.  Abdominal: Soft.  no distension.  no tenderness.  Musculoskeletal: Normal range of motion.  no  tenderness or deformity.  Neurological:  normal muscle tone. Coordination normal. No atrophy Skin: Skin is warm and dry. No rash noted. not diaphoretic.  Psychiatric:  normal mood and affect. behavior is normal. Thought content normal.    Recent Labs: 09/13/2019: ALT 12 10/15/2019: Hemoglobin 14.2; Platelets 257; TSH 1.363 10/30/2019: BUN 16; Creatinine, Ser 0.65; Potassium 4.0; Sodium 139    Lipid Panel Lab Results  Component Value Date   CHOL 189 09/13/2019   HDL 63.70 09/13/2019   LDLCALC 110 (H) 09/13/2019   TRIG 73.0 09/13/2019      Wt Readings from Last 3 Encounters:  05/04/20 152 lb (68.9 kg)  03/13/20 150 lb (68 kg)  10/16/19 146 lb 4 oz (66.3 kg)       ASSESSMENT AND  PLAN:  Problem List Items Addressed This Visit      Cardiology Problems   Hypertension   Paroxysmal atrial fibrillation ([place]) - Primary    Other Visit Diagnoses    Hyperlipidemia, unspecified hyperlipidemia type         Paroxysmal atrial fibrillation (HCC) -   Continue diltiazem CD 120 mg daily and metoprolol Rare diltiazem 30 mg CHADS VASC 2, continue xarelto 20 mg daily No arrhythmia sx  Essential hypertension Blood pressure is well controlled on today's visit. No changes made to the medications.  Hypercholesterolemia crestor 10 mg daily Total chol 189  Chest pain Discussed nature of her symptoms Atypical in nature, if symptoms get worse could order CT coronary calcium scoring If she is having some anxiety, she does have medication for anxiety on her medication list Recommended techniques for reduction in her stress  Aortic atherosclerosis  on Crestor, had mild amount   Disposition:   F/U  12  months   Total encounter time more than 25 minutes  Greater than 50% was spent in counseling and coordination of care with the patient    Signed, Esmond Plants, M.D., Ph.D. [place], [place]

## 2020-05-04 ENCOUNTER — Other Ambulatory Visit: Payer: Self-pay

## 2020-05-04 ENCOUNTER — Encounter: Payer: Self-pay | Admitting: Cardiovascular Disease

## 2020-05-04 ENCOUNTER — Ambulatory Visit (INDEPENDENT_AMBULATORY_CARE_PROVIDER_SITE_OTHER): Payer: Medicare Other | Admitting: Cardiovascular Disease

## 2020-05-04 VITALS — BP 114/72 | HR 58 | Ht 68.0 in | Wt 152.0 lb

## 2020-05-04 DIAGNOSIS — I10 Essential (primary) hypertension: Secondary | ICD-10-CM | POA: Diagnosis not present

## 2020-05-04 DIAGNOSIS — I480 Paroxysmal atrial fibrillation: Secondary | ICD-10-CM | POA: Diagnosis not present

## 2020-05-04 DIAGNOSIS — E785 Hyperlipidemia, unspecified: Secondary | ICD-10-CM | POA: Diagnosis not present

## 2020-05-04 MED ORDER — DILTIAZEM HCL 30 MG PO TABS: 30.0000 mg | ORAL_TABLET | Freq: Three times a day (TID) | ORAL | 3 refills | Status: DC | PRN

## 2020-05-04 NOTE — Patient Instructions (Signed)
Medication Instructions:  No changes  If you need a refill on your cardiac medications before your next appointment, please call your pharmacy.    Lab work: No new labs needed   If you have labs (blood work) drawn today and your tests are completely normal, you will receive your results only by: . MyChart Message (if you have MyChart) OR . A paper copy in the mail If you have any lab test that is abnormal or we need to change your treatment, we will call you to review the results.   Testing/Procedures: No new testing needed   Follow-Up: At [facility], you and your health needs are our priority.  As part of our continuing mission to provide you with exceptional heart care, we have created designated Provider Care Teams.  These Care Teams include your primary Cardiologist (physician) and Advanced Practice Providers (APPs -  Physician Assistants and Nurse Practitioners) who all work together to provide you with the care you need, when you need it.  . You will need a follow up appointment in 12 months  . Providers on your designated Care Team:   . Christopher Berge, NP . Ryan Dunn, PA-C . Jacquelyn Visser, PA-C  Any Other Special Instructions Will Be Listed Below (If Applicable).  COVID-19 Vaccine Information can be found at: [email or web address] For questions related to vaccine distribution or appointments, please email [email or web address] or call [phone].     

## 2020-05-05 ENCOUNTER — Other Ambulatory Visit: Payer: Self-pay | Admitting: Internal Medicine

## 2020-05-05 NOTE — Telephone Encounter (Signed)
Refill request for xanax, last seen 03-13-20, last filled 03-03-20.  Please advise.

## 2020-05-05 NOTE — Telephone Encounter (Signed)
rx ok'd for xanax #30 with no refills.   

## 2020-05-06 ENCOUNTER — Other Ambulatory Visit: Payer: Self-pay | Admitting: Internal Medicine

## 2020-05-09 ENCOUNTER — Other Ambulatory Visit: Payer: Self-pay | Admitting: Cardiovascular Disease

## 2020-06-24 ENCOUNTER — Telehealth: Payer: Self-pay | Admitting: Internal Medicine

## 2020-06-24 NOTE — Telephone Encounter (Signed)
VM NOT SETUP. Pt due to schedule Medicare Annual Wellness Visit (AWV)   This should be a telephone visit only=30 minutes.  No hx of AWV; please schedule at anytime with Denisa O'Brien-Blaney at [place]   

## 2020-06-24 NOTE — Telephone Encounter (Signed)
Pt is scheduled for 06/25/20 @ 12:30pm

## 2020-06-25 ENCOUNTER — Ambulatory Visit (INDEPENDENT_AMBULATORY_CARE_PROVIDER_SITE_OTHER): Payer: Medicare Other

## 2020-06-25 VITALS — Ht 68.0 in | Wt 152.0 lb

## 2020-06-25 DIAGNOSIS — Z0000 Encounter for general adult medical examination without abnormal findings: Secondary | ICD-10-CM | POA: Diagnosis not present

## 2020-06-25 DIAGNOSIS — Z1239 Encounter for other screening for malignant neoplasm of breast: Secondary | ICD-10-CM | POA: Diagnosis not present

## 2020-06-25 DIAGNOSIS — Z780 Asymptomatic menopausal state: Secondary | ICD-10-CM

## 2020-06-25 NOTE — Progress Notes (Addendum)
Subjective:   Allison [name] is a 69 y.o. female who presents for an Initial Medicare Annual Wellness Visit.  Review of Systems    No ROS.  Medicare Wellness Virtual Visit.   Cardiac Risk Factors include: advanced age (>66men, >43 women);hypertension     Objective:    Today's Vitals   06/25/20 1234  Weight: 152 lb (68.9 kg)  Height: 5\' 8"  (1.727 m)   Body mass index is 23.11 kg/m.  Advanced Directives 06/25/2020 10/15/2019 02/16/2019 12/23/2016  Does Patient Have a Medical Advance Directive? Yes Yes No No  Type of Paramedic of [place];Living will [place] - -  Does patient want to make changes to medical advance directive? No - Patient declined No - Patient declined - -  Copy of [place] in Chart? Yes - validated most recent copy scanned in chart (See row information) No - copy requested - -  Would patient like information on creating a medical advance directive? - - - No - Patient declined    Current Medications (verified) Outpatient Encounter Medications as of 06/25/2020  Medication Sig  . ALPRAZolam (XANAX) 0.25 MG tablet TAKE 1 TABLET BY MOUTH ONCE DAILY AS NEEDED  . diltiazem (CARDIZEM CD) 120 MG 24 hr capsule Take 1 capsule (120 mg total) by mouth daily.  Marland Kitchen diltiazem (CARDIZEM) 30 MG tablet Take 1 tablet (30 mg total) by mouth 3 (three) times daily as needed (As needed for tachycardia/atrial fib).  . hyoscyamine (LEVSIN, ANASPAZ) 0.125 MG tablet TAKE 1 TABLET BY MOUTH EVERY 6 HOURS AS NEEDED  . metoprolol succinate (TOPROL-XL) 50 MG 24 hr tablet TAKE 1 TABLET BY MOUTH ONCE DAILY AFTER SUPPER. TAKE WITH OR IMMEDIATELY FOLLOWING A MEAL.  Marland Kitchen omeprazole (PRILOSEC) 20 MG capsule Take 1 capsule by mouth once daily  . potassium chloride (KLOR-CON) 10 MEQ tablet Take 1 tablet (10 mEq total) by mouth daily.  . rivaroxaban (XARELTO) 20 MG TABS tablet Take 1 tablet (20 mg total) by mouth daily with supper.  .  rosuvastatin (CRESTOR) 10 MG tablet Take 1 tablet by mouth once daily   No facility-administered encounter medications on file as of 06/25/2020.    Allergies (verified) Iodine   History: Past Medical History:  Diagnosis Date  . A-fib ([place])   . GERD (gastroesophageal reflux disease)   . Hypercholesterolemia   . Hypertension   . Migraine headache   . Psoriasis    Past Surgical History:  Procedure Laterality Date  . COLONOSCOPY WITH PROPOFOL N/A 12/23/2016   Procedure: COLONOSCOPY WITH PROPOFOL;  Surgeon: Lollie Sails, MD;  Location[facility] END[facility]COPY;  Service: Endoscopy;  Laterality: N/A;  . DILATION AND CURETTAGE OF UTERUS     history of abnormal bleeding  . TUBAL LIGATION     Family History  Problem Relation Age of Onset  . Asthma Father   . Diabetes Mother   . CVA Mother   . Psoriasis Mother   . Breast cancer Maternal Grandmother   . Breast cancer Paternal Grandmother   . Colon cancer Maternal Uncle   . Colon cancer Other        nephew  . Diabetes Other        multiple relatives (both sides)  . Stroke Brother        Light stroke   Social History   Socioeconomic History  . Marital status: Single    Spouse name: Not on file  . Number of children: 1  .  Years of education: Not on file  . Highest education level: Not on file  Occupational History  . Not on file  Tobacco Use  . Smoking status: Never Smoker  . Smokeless tobacco: Never Used  Substance and Sexual Activity  . Alcohol use: No    Alcohol/week: 0.0 standard drinks  . Drug use: No  . Sexual activity: Not on file  Other Topics Concern  . Not on file  Social History Narrative  . Not on file   Social Determinants of Health   Financial Resource Strain: Low Risk   . Difficulty of Paying Living Expenses: Not hard at all  Food Insecurity: No Food Insecurity  . Worried About Charity fundraiser in the Last Year: Never true  . Ran Out of Food in the Last Year: Never true  Transportation Needs: No  Transportation Needs  . Lack of Transportation (Medical): No  . Lack of Transportation (Non-Medical): No  Physical Activity:   . Days of Exercise per Week: Not on file  . Minutes of Exercise per Session: Not on file  Stress: No Stress Concern Present  . Feeling of Stress : Not at all  Social Connections: Unknown  . Frequency of Communication with Friends and Family: More than three times a week  . Frequency of Social Gatherings with Friends and Family: More than three times a week  . Attends Religious Services: Not on file  . Active Member of Clubs or Organizations: Not on file  . Attends Archivist Meetings: Not on file  . Marital Status: Not on file    Tobacco Counseling Counseling given: Not Answered   Clinical Intake:  Pre-visit preparation completed: Yes        Diabetes: No  How often do you need to have someone help you when you read instructions, pamphlets, or other written materials from your doctor or pharmacy?: 1 - Never Interpreter Needed?: No      Activities of Daily Living In your present state of health, do you have any difficulty performing the following activities: 06/25/2020  Hearing? N  Vision? N  Difficulty concentrating or making decisions? N  Walking or climbing stairs? N  Dressing or bathing? N  Doing errands, shopping? N  Preparing Food and eating ? N  Using the Toilet? N  In the past six months, have you accidently leaked urine? N  Do you have problems with loss of bowel control? N  Managing your Medications? N  Managing your Finances? N  Housekeeping or managing your Housekeeping? N  Some recent data might be hidden    Patient Care Team: Einar Pheasant, MD as PCP - General (Internal Medicine)  Indicate any recent Medical Services you may have received from other than [facility] providers in the past year (date may be approximate).     Assessment:   This is a routine wellness examination for Allison [name].  I connected with Allison [name]  today by telephone and verified that I am speaking with the correct person using two identifiers. Location patient: home Location provider: work Persons participating in the virtual visit: patient, Marine scientist.    I discussed the limitations, risks, security and privacy concerns of performing an evaluation and management service by telephone and the availability of in person appointments. The patient expressed understanding and verbally consented to this telephonic visit.    Interactive audio and video telecommunications were attempted between this provider and patient, however failed, due to patient having technical difficulties OR patient did not have access  to video capability.  We continued and completed visit with audio only.  Some vital signs may be absent or patient reported.   Hearing/Vision screen  Hearing Screening   125Hz  250Hz  500Hz  1000Hz  2000Hz  3000Hz  4000Hz  6000Hz  8000Hz   Right ear:           Left ear:           Comments: Patient is able to hear conversational tones without difficulty.  No issues reported.   Vision Screening Comments: Visual acuity not assessed, virtual visit.  They have seen their ophthalmologist in the last 12 months.     Dietary issues and exercise activities discussed: Current Exercise Habits: Home exercise routine, Type of exercise: walking, Intensity: Mild  Goals      Patient Stated   .  Increase physical activity (pt-stated)      I would like to walk more for exercise.      Depression Screen PHQ 2/9 Scores 06/25/2020 08/23/2019 07/27/2018 07/21/2017 07/21/2017 07/15/2016 07/13/2015  PHQ - 2 Score 0 0 0 0 0 0 0  PHQ- 9 Score - - - 1 - - -    Fall Risk Fall Risk  06/25/2020 07/27/2018 07/21/2017 07/15/2016 07/13/2015  Falls in the past year? 0 No No No No  Number falls in past yr: 0 - - - -  Injury with Fall? - - - - -  Follow up Falls evaluation completed - - - -   Handrails in use when climbing stairs?Yes  Home free of loose throw rugs in walkways,  pet beds, electrical cords, etc? Yes  Adequate lighting in your home to reduce risk of falls? Yes   ASSISTIVE DEVICES UTILIZED TO PREVENT FALLS:  Life alert? No  Use of a cane, walker or w/c? No  Grab bars in the bathroom? Yes  Shower chair or bench in shower? Yes  Elevated toilet seat or a handicapped toilet? Yes   TIMED UP AND GO:  Was the test performed? No . Virtual visit.   Cognitive Function:  Patient is alert and oriented x3.  Currently works and denies memory loss, difficulty making decision, focusing.    Immunizations Immunization History  Administered Date(s) Administered  . Fluad Quad(high Dose 65+) 08/23/2019  . Influenza Split 07/10/2013  . Influenza, High Dose Seasonal PF 07/27/2018  . Influenza,inj,Quad PF,6+ Mos 07/08/2014, 07/21/2015, 07/15/2016, 07/21/2017  . PFIZER SARS-COV-2 Vaccination 01/13/2020, 02/05/2020  . Pneumococcal Conjugate-13 09/13/2019  . Td 09/26/2016    Health Maintenance Health Maintenance  Topic Date Due  . Hepatitis C Screening  Never done  . DEXA SCAN  Never done  . INFLUENZA VACCINE  05/24/2020  . MAMMOGRAM  07/30/2020  . PNA vac Low Risk Adult (2 of 2 - PPSV23) 09/12/2020  . COLONOSCOPY  12/23/2021  . TETANUS/TDAP  09/26/2026  . COVID-19 Vaccine  Completed   Mammogram order placed per patient preference. Patient understands not due until Oct 7.  Dexa Scan- order placed per patient preference.   Dental Screening: Recommended annual dental exams for proper oral hygiene. Visits every 6 months.   Community Resource Referral / Chronic Care Management: CRR required this visit?  No   CCM required this visit?  No      Plan:   Keep all routine maintenance appointments.   Fasting lab 09/21/20   Cpe 09/28/20  I have personally reviewed and noted the following in the patient's chart:   . Medical and social history . Use of alcohol, tobacco or illicit drugs  .  Current medications and supplements . Functional ability and  status . Nutritional status . Physical activity . Advanced directives . List of other physicians . Hospitalizations, surgeries, and ER visits in previous 12 months . Vitals . Screenings to include cognitive, depression, and falls . Referrals and appointments  In addition, I have reviewed and discussed with patient certain preventive protocols, quality metrics, and best practice recommendations. A written personalized care plan for preventive services as well as general preventive health recommendations were provided to patient via mychart.     Varney Biles, LPN   06/01/8420     Reviewed above information.  Agree with assessment and plan.  Dr Nicki Reaper

## 2020-06-25 NOTE — Patient Instructions (Addendum)
Ms. Spano , Thank you for taking time to come for your Medicare Wellness Visit. I appreciate your ongoing commitment to your health goals. Please review the following plan we discussed and let me know if I can assist you in the future.   These are the goals we discussed: Goals      Patient Stated   .  Increase physical activity (pt-stated)      I would like to walk more for exercise.       This is a list of the screening recommended for you and due dates:  Health Maintenance  Topic Date Due  .  Hepatitis C: One time screening is recommended by Center for Disease Control  (CDC) for  adults born from [birth year] through 1964.   Never done  . DEXA scan (bone density measurement)  Never done  . Flu Shot  05/24/2020  . Mammogram  07/30/2020  . Pneumonia vaccines (2 of 2 - PPSV23) 09/12/2020  . Colon Cancer Screening  12/23/2021  . Tetanus Vaccine  09/26/2026  . COVID-19 Vaccine  Completed    Immunizations Immunization History  Administered Date(s) Administered  . Fluad Quad(high Dose 65+) 08/23/2019  . Influenza Split 07/10/2013  . Influenza, High Dose Seasonal PF 07/27/2018  . Influenza,inj,Quad PF,6+ Mos 07/08/2014, 07/21/2015, 07/15/2016, 07/21/2017  . PFIZER SARS-COV-2 Vaccination 01/13/2020, 02/05/2020  . Pneumococcal Conjugate-13 09/13/2019  . Td 09/26/2016   Keep all routine maintenance appointments.   Fasting lab 09/21/20   Cpe 09/28/20  Bone Density Test The bone density test uses a special type of X-ray to measure the amount of calcium and other minerals in your bones. It can measure bone density in the hip and the spine. The test procedure is similar to having a regular X-ray. This test may also be called:  Bone densitometry.  Bone mineral density test.  Dual-energy X-ray absorptiometry (DEXA). You may have this test to:  Diagnose a condition that causes weak or thin bones (osteoporosis).  Screen you for osteoporosis.  Predict your risk for a broken bone  (fracture).  Determine how well your osteoporosis treatment is working. Tell a health care provider about:  Any allergies you have.  All medicines you are taking, including vitamins, herbs, eye drops, creams, and over-the-counter medicines.  Any problems you or family members have had with anesthetic medicines.  Any blood disorders you have.  Any surgeries you have had.  Any medical conditions you have.  Whether you are pregnant or may be pregnant.  Any medical tests you have had within the past 14 days that used contrast material. What are the risks? Generally, this is a safe procedure. However, it does expose you to a small amount of radiation, which can slightly increase your cancer risk. What happens before the procedure?  Do not take any calcium supplements starting 24 hours before your test.  Remove all metal jewelry, eyeglasses, dental appliances, and any other metal objects. What happens during the procedure?   You will lie down on an exam table. There will be an X-ray generator below you and an imaging device above you.  Other devices, such as boxes or braces, may be used to position your body properly for the scan.  The machine will slowly scan your body. You will need to keep still.  The images will show up on a screen in the room. Images will be examined by a specialist after your test is done. The procedure may vary among health care providers and hospitals. What  happens after the procedure?  It is up to you to get your test results. Ask your health care provider, or the department that is doing the test, when your results will be ready. Summary  A bone density test is an imaging test that uses a type of X-ray to measure the amount of calcium and other minerals in your bones.  The test may be used to diagnose or screen you for a condition that causes weak or thin bones (osteoporosis), predict your risk for a broken bone (fracture), or determine how well your  osteoporosis treatment is working.  Do not take any calcium supplements starting 24 hours before your test.  Ask your health care provider, or the department that is doing the test, when your results will be ready. This information is not intended to replace advice given to you by your health care provider. Make sure you discuss any questions you have with your health care provider. Document Revised: 10/26/2017 Document Reviewed: 08/14/2017 Elsevier Patient Education  [place] A mammogram is a low energy X-ray of the breasts that is done to check for abnormal changes. This procedure can screen for and detect any changes that may indicate breast cancer. Mammograms are regularly done on women. A man may have a mammogram if he has a lump or swelling in his breast. A mammogram can also identify other changes and variations in the breast, such as:  Inflammation of the breast tissue (mastitis).  An infected area that contains a collection of pus (abscess).  A fluid-filled sac (cyst).  Fibrocystic changes. This is when breast tissue becomes denser, which can make the tissue feel rope-like or uneven under the skin.  Tumors that are not cancerous (benign). Tell a health care provider:  About any allergies you have.  If you have breast implants.  If you have had previous breast disease, biopsy, or surgery.  If you are breastfeeding.  If you are younger than age 37.  If you have a family history of breast cancer.  Whether you are pregnant or may be pregnant. What are the risks? Generally, this is a safe procedure. However, problems may occur, including:  Exposure to radiation. Radiation levels are very low with this test.  The results being misinterpreted.  The need for further tests.  The inability of the mammogram to detect certain cancers. What happens before the procedure?  Schedule your test about 1-2 weeks after your menstrual period if you are still  menstruating. This is usually when your breasts are the least tender.  If you have had a mammogram done at a different facility in the past, get the mammogram X-rays or have them sent to your current exam facility. The new and old images will be compared.  Wash your breasts and underarms on the day of the test.  Do not wear deodorants, perfumes, lotions, or powders anywhere on your body on the day of the test.  Remove any jewelry from your neck.  Wear clothes that you can change into and out of easily. What happens during the procedure?   You will undress from the waist up and put on a gown that opens in the front.  You will stand in front of the X-ray machine.  Each breast will be placed between two plastic or glass plates. The plates will compress your breast for a few seconds. Try to stay as relaxed as possible during the procedure. This does not cause any harm to your breasts  and any discomfort you feel will be very brief.  X-rays will be taken from different angles of each breast. The procedure may vary among health care providers and hospitals. What happens after the procedure?  The mammogram will be examined by a specialist (radiologist).  You may need to repeat certain parts of the test, depending on the quality of the images. This is commonly done if the radiologist needs a better view of the breast tissue.  You may resume your normal activities.  It is up to you to get the results of your procedure. Ask your health care provider, or the department that is doing the procedure, when your results will be ready. Summary  A mammogram is a low energy X-ray of the breasts that is done to check for abnormal changes. A man may have a mammogram if he has a lump or swelling in his breast.  If you have had a mammogram done at a different facility in the past, get the mammogram X-rays or have them sent to your current exam facility in order to compare them.  Schedule your test about  1-2 weeks after your menstrual period if you are still menstruating.  For this test, each breast will be placed between two plastic or glass plates. The plates will compress your breast for a few seconds.  Ask when your test results will be ready. Make sure you get your test results. This information is not intended to replace advice given to you by your health care provider. Make sure you discuss any questions you have with your health care provider. Document Revised: 05/31/2018 Document Reviewed: 05/31/2018 Elsevier Patient Education  [place].

## 2020-07-26 ENCOUNTER — Emergency Department: Payer: Medicare Other

## 2020-07-26 ENCOUNTER — Other Ambulatory Visit: Payer: Self-pay

## 2020-07-26 ENCOUNTER — Emergency Department
Admission: EM | Admit: 2020-07-26 | Discharge: 2020-07-26 | Disposition: A | Discharge status: Home / Self Care | Payer: Medicare Other | Attending: Emergency Medicine | Admitting: Emergency Medicine

## 2020-07-26 DIAGNOSIS — R002 Palpitations: Secondary | ICD-10-CM | POA: Diagnosis not present

## 2020-07-26 DIAGNOSIS — I10 Essential (primary) hypertension: Secondary | ICD-10-CM | POA: Diagnosis not present

## 2020-07-26 DIAGNOSIS — Z7901 Long term (current) use of anticoagulants: Secondary | ICD-10-CM | POA: Insufficient documentation

## 2020-07-26 DIAGNOSIS — Z79899 Other long term (current) drug therapy: Secondary | ICD-10-CM | POA: Insufficient documentation

## 2020-07-26 DIAGNOSIS — I480 Paroxysmal atrial fibrillation: Secondary | ICD-10-CM | POA: Diagnosis not present

## 2020-07-26 LAB — BASIC METABOLIC PANEL
Anion gap: 3 — ABNORMAL LOW (ref 5–15)
BUN: 12 mg/dL (ref 8–23)
CO2: 33 mmol/L — ABNORMAL HIGH (ref 22–32)
Calcium: 9.2 mg/dL (ref 8.9–10.3)
Chloride: 104 mmol/L (ref 98–111)
Creatinine, Ser: 0.83 mg/dL (ref 0.44–1.00)
GFR calc Af Amer: >60 mL/min (ref >60)
GFR calc non Af Amer: >60 mL/min (ref >60)
Glucose, Bld: 119 mg/dL — ABNORMAL HIGH (ref 70–99)
Potassium: 3.8 mmol/L (ref 3.5–5.1)
Sodium: 140 mmol/L (ref 135–145)

## 2020-07-26 LAB — CBC
HCT: 42.9 % (ref 36.0–46.0)
Hemoglobin: 15.1 g/dL — ABNORMAL HIGH (ref 12.0–15.0)
MCH: 31.5 pg (ref 26.0–34.0)
MCHC: 35.2 g/dL (ref 30.0–36.0)
MCV: 89.4 fL (ref 80.0–100.0)
Platelets: 259 10*3/uL (ref 150–400)
RBC: 4.8 MIL/uL (ref 3.87–5.11)
RDW: 12.5 % (ref 11.5–15.5)
WBC: 6.2 10*3/uL (ref 4.0–10.5)
nRBC: 0 % (ref 0.0–0.2)

## 2020-07-26 LAB — TSH: TSH: 3.003 u[IU]/mL (ref 0.350–4.500)

## 2020-07-26 LAB — TROPONIN I (HIGH SENSITIVITY)
Troponin I (High Sensitivity): 6 ng/L (ref <18)
Troponin I (High Sensitivity): 11 ng/L (ref <18)

## 2020-07-26 LAB — T4, FREE: Free T4: 0.82 ng/dL (ref 0.61–1.12)

## 2020-07-26 NOTE — ED Notes (Signed)
Pt visualized sitting in bed, NAD at this time. Pt denies any pain at this time.

## 2020-07-26 NOTE — Discharge Instructions (Signed)
Please continue to take your normal regimen of medications for your heart.  As we discussed, you may take the shorter acting diltiazem 30 mg up to 3 times per day.  I would probably wait about 30 minutes between each dose if you feel like you need to take another one.  As we discussed, your blood pressure cuff may give you an inaccurate number while you are in fast A. fib rates.   If you develop any chest pain, passing out or throwing up with your symptoms, please return to the ED.

## 2020-07-26 NOTE — ED Triage Notes (Signed)
Patient reports that she felt her heart 'skipping in her chest' and so took her BP. Patient reports she was hypertensive, with her systolic over 060.

## 2020-07-26 NOTE — ED Notes (Signed)
Pt verbalized understanding d/c instructions and denies further questions at this time.

## 2020-07-26 NOTE — ED Notes (Signed)
EDP Smith at bedside at this time

## 2020-07-26 NOTE — ED Provider Notes (Signed)
[facility] Emergency Department Provider Note ____________________________________________   First MD Initiated Contact with Patient 07/26/20 [phone]     (approximate)  I have reviewed the triage vital signs and the nursing notes.  HISTORY  Chief Complaint Palpitations and Hypertension   HPI Allison [name] is a 69 y.o. femalewho presents to the ED for evaluation of palpitations  Chart review indicates hx paroxysmal A. fib followed by cardiology Dr. Rockey Situ, last seen about 3 months ago.  Rate controlled with diltiazem CD and low-dose Toprol-XL, and furthermore patient has a short acting diltiazem 30 mg tablet she takes as needed for palpitations and tachycardia.  Xarelto for anticoagulation.  Otherwise HTN and HLD.  Patient reports being in her typical state of health until developing heart palpitations last night, as she reports she occasionally does.  She reports taking her short acting diltiazem 30 mg tablet on top of her typical medications, and this alleviating her symptoms.  As she was waiting for this medication to kick in, she reports checking her blood pressure at home and noting a BP of about 140/110, and reports concern for this degree of hypertension and presents to the ED for evaluation of hypertension.  Patient reports her heart palpitations were subsiding at the time of triage, which is about 6 hours prior to my evaluation of the patient due to prolonged wait times in the ED.  When I see the patient, she denies any symptoms and reports feeling well.  Heart rate has been 50s-60s for the past 4 hours and hypertension has improved.  She reports having all of her medications at home.  She reports that she has never had chest pain with her palpitations and has no history of ACS.  She denies any associated diaphoresis, vomiting, syncope, shortness of breath, fevers, abdominal pain or symptoms beyond her palpitations and home hypertension.    Past Medical  History:  Diagnosis Date  . A-fib ([place])   . GERD (gastroesophageal reflux disease)   . Hypercholesterolemia   . Hypertension   . Migraine headache   . Psoriasis     Patient Active Problem List   Diagnosis Date Noted  . Hyperglycemia 05/13/2019  . Paroxysmal atrial fibrillation ([place]) 02/03/2019  . Loose stools 07/23/2017  . Weight loss 07/23/2017  . 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 LLQ abdominal pain 09/14/2016  . Diverticulosis 04/18/2016  . Right shoulder pain 01/24/2016  . Health care maintenance 05/04/2015  . Leg cramps 05/01/2015  . Unspecified constipation 07/13/2014  . Eye irritation 11/14/2013  . Hypertension 10/15/2012  . Hypercholesterolemia 10/15/2012  . GERD (gastroesophageal reflux disease) 10/15/2012  . Migraine headache 10/15/2012    Past Surgical History:  Procedure Laterality Date  . COLONOSCOPY WITH PROPOFOL N/A 12/23/2016   Procedure: COLONOSCOPY WITH PROPOFOL;  Surgeon: Lollie Sails, MD;  Location: [facility] ENDOSCOPY;  Service: Endoscopy;  Laterality: N/A;  . DILATION AND CURETTAGE OF UTERUS     history of abnormal bleeding  . TUBAL LIGATION      Prior to Admission medications   Medication Sig Start Date End Date Taking? Authorizing Provider  ALPRAZolam Duanne Moron) 0.25 MG tablet TAKE 1 TABLET BY MOUTH ONCE DAILY AS NEEDED 05/05/20   Einar Pheasant, MD  diltiazem (CARDIZEM CD) 120 MG 24 hr capsule Take 1 capsule (120 mg total) by mouth daily. 10/09/19   Minna Merritts, MD  diltiazem (CARDIZEM) 30 MG tablet Take 1 tablet (30 mg total) by mouth 3 (three) times daily as needed (As needed  for tachycardia/atrial fib). 05/04/20   Minna Merritts, MD  hyoscyamine (LEVSIN, ANASPAZ) 0.125 MG tablet TAKE 1 TABLET BY MOUTH EVERY 6 HOURS AS NEEDED 12/31/18   Einar Pheasant, MD  metoprolol succinate (TOPROL-XL) 50 MG 24 hr tablet TAKE 1 TABLET BY MOUTH ONCE DAILY AFTER SUPPER. TAKE WITH OR IMMEDIATELY FOLLOWING A MEAL. 05/11/20   Minna Merritts, MD  omeprazole  (PRILOSEC) 20 MG capsule Take 1 capsule by mouth once daily 08/27/19   Einar Pheasant, MD  potassium chloride (KLOR-CON) 10 MEQ tablet Take 1 tablet (10 mEq total) by mouth daily. 10/16/19 05/04/20  Wellington Hampshire, MD  rivaroxaban (XARELTO) 20 MG TABS tablet Take 1 tablet (20 mg total) by mouth daily with supper. 02/05/20   Minna Merritts, MD  rosuvastatin (CRESTOR) 10 MG tablet Take 1 tablet by mouth once daily 05/07/20   Einar Pheasant, MD    Allergies Iodine  Family History  Problem Relation Age of Onset  . Asthma Father   . Diabetes Mother   . CVA Mother   . Psoriasis Mother   . Breast cancer Maternal Grandmother   . Breast cancer Paternal Grandmother   . Colon cancer Maternal Uncle   . Colon cancer Other        nephew  . Diabetes Other        multiple relatives (both sides)  . Stroke Brother        Light stroke    Social History Social History   Tobacco Use  . Smoking status: Never Smoker  . Smokeless tobacco: Never Used  Substance Use Topics  . Alcohol use: No    Alcohol/week: 0.0 standard drinks  . Drug use: No    Review of Systems  Constitutional: No fever/chills Eyes: No visual changes. ENT: No sore throat. Cardiovascular: Denies chest pain.  Positive for palpitations. Respiratory: Denies shortness of breath. Gastrointestinal: No abdominal pain.  No nausea, no vomiting.  No diarrhea.  No constipation. Genitourinary: Negative for dysuria. Musculoskeletal: Negative for back pain. Skin: Negative for rash. Neurological: Negative for headaches, focal weakness or numbness.  ____________________________________________   PHYSICAL EXAM:  VITAL SIGNS: Vitals:   07/26/20 0844 07/26/20 0852  BP: (!) 150/63 132/66  Pulse: (!) 59 (!) 54  Resp: 17 15  Temp:    SpO2: 100% 98%      Constitutional: Alert and oriented. Well appearing and in no acute distress. Eyes: Conjunctivae are normal. PERRL. EOMI. Head: Atraumatic. Nose: No  congestion/rhinnorhea. Mouth/Throat: Mucous membranes are moist.  Oropharynx non-erythematous. Neck: No stridor. No cervical spine tenderness to palpation. Cardiovascular: Normal rate, irregular rhythm. Grossly normal heart sounds.  Good peripheral circulation. Respiratory: Normal respiratory effort.  No retractions. Lungs CTAB. Gastrointestinal: Soft , nondistended, nontender to palpation. No abdominal bruits. No CVA tenderness. Musculoskeletal: No lower extremity tenderness nor edema.  No joint effusions. No signs of acute trauma. Neurologic:  Normal speech and language. No gross focal neurologic deficits are appreciated. No gait instability noted. Skin:  Skin is warm, dry and intact. No rash noted. Psychiatric: Mood and affect are normal. Speech and behavior are normal.  ____________________________________________   LABS (all labs ordered are listed, but only abnormal results are displayed)  Labs Reviewed  BASIC METABOLIC PANEL - Abnormal; Notable for the following components:      Result Value   CO2 33 (*)    Glucose, Bld 119 (*)    Anion gap 3 (*)    All other components within normal limits  CBC -  Abnormal; Notable for the following components:   Hemoglobin 15.1 (*)    All other components within normal limits  TSH  T4, FREE  TROPONIN I (HIGH SENSITIVITY)  TROPONIN I (HIGH SENSITIVITY)   ____________________________________________  12 Lead EKG  A. fib with a rate of 118 bpm.  Left axis deviation.  Normal intervals.  2 PVCs.  No evidence of acute ischemia.  A. fib with RVR with moderate rates ____________________________________________  RADIOLOGY  ED MD interpretation: 2 view CXR reviewed with baseline hyperinflation without evidence of acute cardiopulmonary pathology.  Official radiology report(s): DG Chest 2 View  Result Date: 07/26/2020 CLINICAL DATA:  Palpitations EXAM: CHEST - 2 VIEW COMPARISON:  10/15/2019 FINDINGS: The lungs are hyperinflated with diffuse  interstitial prominence. No focal airspace consolidation or pulmonary edema. No pleural effusion or pneumothorax. Normal cardiomediastinal contours. IMPRESSION: Hyperinflated lungs without acute airspace disease. Electronically Signed   By: Ulyses Jarred M.D.   On: 07/26/2020 02:50   ____________________________________________   PROCEDURES and INTERVENTIONS  Procedure(s) performed (including Critical Care):  .1-3 Lead EKG Interpretation Performed by: Vladimir Crofts, MD Authorized by: Vladimir Crofts, MD     Interpretation: normal     ECG rate:  61   ECG rate assessment: normal     Rhythm: atrial fibrillation     Ectopy: none     Conduction: normal      Medications - No data to display  ____________________________________________   MDM / ED COURSE  69 year old woman with history of paroxysmal A. fib presents to the ED after a sensation of palpitations at home, likely due to A. fib with RVR that she appropriately treated as an outpatient, not evidence of ACS or additional pathology, and amenable to outpatient management.  Patient tachycardic in A. fib with RVR in triage, resolved by her home diltiazem but the time I see the patient, and her vitals otherwise normal on room air.  While she has some mild hypertension in triage with systolic of 578, she has no evidence of hypertension emergency.  Blood work is reassuring without evidence of acute derangements.  EKG is nonischemic and troponin is negative x2.  CXR without evidence of acute cardiopulmonary pathology.  Patient remains not tachycardic for greater than 4 hours and asymptomatic throughout her stay in the ED.  She has no evidence of endorgan damage or acute derangements.  Educated patient on continuing her rate control medications, and urged following up with her cardiologist.  We discussed return precautions for the ED.  Patient medically stable for discharge home.  ____________________________________________   FINAL CLINICAL  IMPRESSION(S) / ED DIAGNOSES  Final diagnoses:  Paroxysmal A-fib ([place])  Heart palpitations  Primary hypertension     ED Discharge Orders    None       Charvez Voorhies   Note:  This document was prepared using Dragon voice recognition software and may include unintentional dictation errors.   Vladimir Crofts, MD 07/26/20 [phone]

## 2020-07-27 ENCOUNTER — Telehealth: Payer: Self-pay | Admitting: Cardiovascular Disease

## 2020-07-27 NOTE — Telephone Encounter (Signed)
Attempted to schedule no ans no vm needs ed fu

## 2020-07-30 ENCOUNTER — Encounter: Payer: Self-pay | Admitting: Family

## 2020-07-30 ENCOUNTER — Ambulatory Visit: Payer: Medicare Other | Admitting: Family

## 2020-07-30 ENCOUNTER — Other Ambulatory Visit: Payer: Self-pay

## 2020-07-30 VITALS — BP 100/70 | HR 65 | Ht 68.0 in | Wt 154.5 lb

## 2020-07-30 DIAGNOSIS — I491 Atrial premature depolarization: Secondary | ICD-10-CM

## 2020-07-30 DIAGNOSIS — Z7901 Long term (current) use of anticoagulants: Secondary | ICD-10-CM | POA: Diagnosis not present

## 2020-07-30 DIAGNOSIS — I493 Ventricular premature depolarization: Secondary | ICD-10-CM

## 2020-07-30 DIAGNOSIS — I480 Paroxysmal atrial fibrillation: Secondary | ICD-10-CM | POA: Diagnosis not present

## 2020-07-30 DIAGNOSIS — I10 Essential (primary) hypertension: Secondary | ICD-10-CM | POA: Diagnosis not present

## 2020-07-30 DIAGNOSIS — E782 Mixed hyperlipidemia: Secondary | ICD-10-CM

## 2020-07-30 MED ORDER — METOPROLOL SUCCINATE ER 50 MG PO TB24: 75.0000 mg | ORAL_TABLET | Freq: Every day | ORAL | 1 refills | Status: DC

## 2020-07-30 NOTE — Progress Notes (Signed)
Office Visit    Patient Name: Allison [name] Date of Encounter: 07/30/2020  Primary Care Provider:  Einar Pheasant, MD Primary Cardiologist:  Ida Rogue, MD Electrophysiologist:  None   Chief Complaint    Allison [name] is a 69 y.o. female with a hx of PAF on anticoagulation, GERD, HLD, HTN presents today for follow up after ED visit with atrial fib.   Past Medical History    Past Medical History:  Diagnosis Date  . A-fib ([place])   . GERD (gastroesophageal reflux disease)   . Hypercholesterolemia   . Hypertension   . Migraine headache   . Psoriasis    Past Surgical History:  Procedure Laterality Date  . COLONOSCOPY WITH PROPOFOL N/A 12/23/2016   Procedure: COLONOSCOPY WITH PROPOFOL;  Surgeon: Lollie Sails, MD;  Location: [facility] ENDOSCOPY;  Service: Endoscopy;  Laterality: N/A;  . DILATION AND CURETTAGE OF UTERUS     history of abnormal bleeding  . TUBAL LIGATION      Allergies  Allergies  Allergen Reactions  . Iodine     History of Present Illness    Allison [name] is a 69 y.o. female with a hx of PAF on anticoagulation, GERD, HLD, HTN last seen 04/2020 by Dr. Rockey Situ.  Previous cardiac workup includes echo 04/2019 with normal LV and RV function, no significant valvular abnormalities. She has a significant family history of atrial fibrillation as well as stroke.   Seen in the ED 07/26/20 for atrial fibrillation. She took her PRN Diltiazem 30mg  at home with improvement. Her BP was elevated at home 140/110 and presented to ED for evaluation. Her HTN and HR improved while waiting to be seen in the ED as she had taken her Diltiazem prior to arrival. HS-troponin negative x2, lab work unremarkable, CXR no acute findings.  Presents today for follow up. Notes an occasional palpitation since last seen but no persistent atrial fibrillation. Notes sensation of "beating, beating, beating then pause" in her heart. She works as a [employer] and very much  enjoys her work. Brings log of BP and HR since her ED visit, detailed below.   114/68 - 68 123/78 - 64 130/87 - 65 116/78 - 65  She drinks only 2 oz of coffee in the morning. Drinks approximately 40 oz of water throughout the day. Reports no recent stressors. Takes no OTC proarrhythmic agents.   Reports no shortness of breath nor dyspnea on exertion. Reports no chest pain, pressure, or tightness. No edema, orthopnea, PND.  EKGs/Labs/Other Studies Reviewed:   The following studies were reviewed today:  EKG:  EKG is ordered today.  The ekg ordered today demonstrates NSR 65 bpm with occasional PAC and PVC. NO acute ST/T wave changes.   Recent Labs: 09/13/2019: ALT 12 07/26/2020: BUN 12; Creatinine, Ser 0.83; Hemoglobin 15.1; Platelets 259; Potassium 3.8; Sodium 140; TSH 3.003  Recent Lipid Panel    Component Value Date/Time   CHOL 189 09/13/2019 0926   TRIG 73.0 09/13/2019 0926   HDL 63.70 09/13/2019 0926   CHOLHDL 3 09/13/2019 0926   VLDL 14.6 09/13/2019 0926   LDLCALC 110 (H) 09/13/2019 0926   LDLCALC 103 (H) 07/27/2018 1552    Home Medications   Current Meds  Medication Sig  . ALPRAZolam (XANAX) 0.25 MG tablet TAKE 1 TABLET BY MOUTH ONCE DAILY AS NEEDED  . diltiazem (CARDIZEM CD) 120 MG 24 hr capsule Take 1 capsule (120 mg total) by mouth daily.  Marland Kitchen diltiazem (CARDIZEM) 30 MG  tablet Take 1 tablet (30 mg total) by mouth 3 (three) times daily as needed (As needed for tachycardia/atrial fib).  . hyoscyamine (LEVSIN, ANASPAZ) 0.125 MG tablet TAKE 1 TABLET BY MOUTH EVERY 6 HOURS AS NEEDED  . metoprolol succinate (TOPROL-XL) 50 MG 24 hr tablet Take 1.5 tablets (75 mg total) by mouth daily. Take with or immediately following a meal.  . omeprazole (PRILOSEC) 20 MG capsule Take 1 capsule by mouth once daily  . potassium chloride (KLOR-CON) 10 MEQ tablet Take 1 tablet (10 mEq total) by mouth daily.  . rivaroxaban (XARELTO) 20 MG TABS tablet Take 1 tablet (20 mg total) by mouth daily  with supper.  . rosuvastatin (CRESTOR) 10 MG tablet Take 1 tablet by mouth once daily  . [DISCONTINUED] metoprolol succinate (TOPROL-XL) 50 MG 24 hr tablet TAKE 1 TABLET BY MOUTH ONCE DAILY AFTER SUPPER. TAKE WITH OR IMMEDIATELY FOLLOWING A MEAL.      Review of Systems    All other systems reviewed and are otherwise negative except as noted above.  Physical Exam    VS:  BP 100/70 (BP Location: Left Arm, Patient Position: Sitting, Cuff Size: Normal)   Pulse 65   Ht 5\' 8"  (1.727 m)   Wt 154 lb 8 oz (70.1 kg)   SpO2 99%   BMI 23.49 kg/m  , BMI Body mass index is 23.49 kg/m. GEN: Well nourished, well developed, in no acute distress. HEENT: normal. Neck: Supple, no JVD, carotid bruits, or masses. Cardiac: irregular, no murmurs, rubs, or gallops. No clubbing, cyanosis, edema.  Radials/DP/PT 2+ and equal bilaterally.  Respiratory:  Respirations regular and unlabored, clear to auscultation bilaterally. GI: Soft, nontender, nondistended, BS + x 4. MS: No deformity or atrophy. Skin: Warm and dry, no rash. Neuro:  Strength and sensation are intact. Psych: Normal affect.   Assessment & Plan    1. PAF/PAC/PVC - Recent ED visit with PAF which resolved after taking her PRN Diltiazem 30mg . Labs in ED with K 3.8, normal renal function, Hb 15.1, normal TSH. EKG today NSR with occasional PVC/PAC. Reports intermittent palpitations. Continue Cardizem CD 120mg  daily. Increase Toprol from 50mg  to 75 mg for improved control of palpitations. Continue PRN diltiazem 30mg  daily. Echo 04/2019 without significant valvular abnormality, no indication to repeat at this time. Future considerations include further increased dose of CCB/BB versus addition of antiarrhytmic agent.   2. HTN - BP well controlled. Continue current antihypertensive regimen.   3. Chronic anticoagulation - Secondary to PAF and CHADS2VASC of at least 3 (HTN, female, age). Denies bleeding complications. Continue Xarelto 20mg  daily. 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  4. HLD - Continue Crestor 10mg  daily.  5. Aortic atherosclerosis - Minimal per Dr. Rockey Situ review of CT images. Continue statin. NO aspirin secondary to chronic anticoagulation.   Disposition: Follow up in 4-6 week(s) with Dr. Rockey Situ or APP   Loel Dubonnet, NP 07/30/2020, 4:30 PM

## 2020-07-30 NOTE — Patient Instructions (Signed)
Medication Instructions:  Your physician has recommended you make the following change in your medication:   CHANGE Metoprolol Succinate to 75 mg (1.5 tablets) in the evening.   *If you need a refill on your cardiac medications before your next appointment, please call your pharmacy*  Lab Work: None ordered today. You lab work in the ED shows normal electrolytes, no anemia, and normal thyroid as well as kidney function.  If you have labs (blood work) drawn today and your tests are completely normal, you will receive your results only by: Marland Kitchen MyChart Message (if you have MyChart) OR . A paper copy in the mail If you have any lab test that is abnormal or we need to change your treatment, we will call you to review the results.  Testing/Procedures: Your EKG today shows normal sinus rhythm with an occasional early beat called a PVC or PAC.  Follow-Up: At [facility], you and your health needs are our priority.  As part of our continuing mission to provide you with exceptional heart care, we have created designated Provider Care Teams.  These Care Teams include your primary Cardiologist (physician) and Advanced Practice Providers (APPs -  Physician Assistants and Nurse Practitioners) who all work together to provide you with the care you need, when you need it.  We recommend signing up for the patient portal called "MyChart".  Sign up information is provided on this After Visit Summary.  MyChart is used to connect with patients for Virtual Visits (Telemedicine).  Patients are able to view lab/test results, encounter notes, upcoming appointments, etc.  Non-urgent messages can be sent to your provider as well.   To learn more about what you can do with MyChart, go to [email or web address].    Your next appointment:   4-6 week(s)  The format for your next appointment:   In Person  Provider:   You may see Ida Rogue, MD or one of the following Advanced Practice Providers on your  designated Care Team:    Murray Hodgkins, NP  Christell Faith, PA-C  Marrianne Mood, PA-C  Cadence Kathlen Mody, [place]  Other Instructions  Things that can cause palpitations: being dehydrated, stress, caffeine, alcohol.  We applied for [employer] to help with the cost of Xarelto. Expect a call from [place] [phone] (Monday-Friday, 8:30 AM-9:00 PM ET).

## 2020-08-04 ENCOUNTER — Ambulatory Visit
Admission: RE | Admit: 2020-08-04 | Discharge: 2020-08-04 | Disposition: A | Discharge status: Home / Self Care | Payer: Medicare Other | Source: Ambulatory Visit | Attending: Internal Medicine | Admitting: Internal Medicine

## 2020-08-04 ENCOUNTER — Other Ambulatory Visit: Payer: Self-pay

## 2020-08-04 DIAGNOSIS — M810 Age-related osteoporosis without current pathological fracture: Secondary | ICD-10-CM | POA: Insufficient documentation

## 2020-08-04 DIAGNOSIS — Z13820 Encounter for screening for osteoporosis: Secondary | ICD-10-CM | POA: Diagnosis not present

## 2020-08-04 DIAGNOSIS — Z1239 Encounter for other screening for malignant neoplasm of breast: Secondary | ICD-10-CM

## 2020-08-04 DIAGNOSIS — Z780 Asymptomatic menopausal state: Secondary | ICD-10-CM

## 2020-08-04 DIAGNOSIS — Z1231 Encounter for screening mammogram for malignant neoplasm of breast: Secondary | ICD-10-CM | POA: Insufficient documentation

## 2020-08-04 DIAGNOSIS — Z8739 Personal history of other diseases of the musculoskeletal system and connective tissue: Secondary | ICD-10-CM | POA: Diagnosis not present

## 2020-08-06 ENCOUNTER — Other Ambulatory Visit: Payer: Self-pay | Admitting: Internal Medicine

## 2020-08-06 DIAGNOSIS — R928 Other abnormal and inconclusive findings on diagnostic imaging of breast: Secondary | ICD-10-CM

## 2020-08-08 ENCOUNTER — Other Ambulatory Visit: Payer: Self-pay | Admitting: Internal Medicine

## 2020-08-09 ENCOUNTER — Other Ambulatory Visit: Payer: Self-pay | Admitting: Internal Medicine

## 2020-08-10 NOTE — Telephone Encounter (Signed)
rx ok'd for alprazolam #30 with no refills.   

## 2020-08-10 NOTE — Telephone Encounter (Signed)
Last fill 7/21 and last OV 5/21

## 2020-08-17 ENCOUNTER — Other Ambulatory Visit: Payer: Self-pay | Admitting: Internal Medicine

## 2020-08-21 ENCOUNTER — Ambulatory Visit
Admission: RE | Admit: 2020-08-21 | Discharge: 2020-08-21 | Disposition: A | Discharge status: Home / Self Care | Payer: Medicare Other | Source: Ambulatory Visit | Attending: Internal Medicine | Admitting: Internal Medicine

## 2020-08-21 ENCOUNTER — Other Ambulatory Visit: Payer: Self-pay

## 2020-08-21 DIAGNOSIS — R928 Other abnormal and inconclusive findings on diagnostic imaging of breast: Secondary | ICD-10-CM

## 2020-09-01 DIAGNOSIS — Z86018 Personal history of other benign neoplasm: Secondary | ICD-10-CM | POA: Diagnosis not present

## 2020-09-01 DIAGNOSIS — L814 Other melanin hyperpigmentation: Secondary | ICD-10-CM | POA: Diagnosis not present

## 2020-09-01 DIAGNOSIS — D2261 Melanocytic nevi of right upper limb, including shoulder: Secondary | ICD-10-CM | POA: Diagnosis not present

## 2020-09-01 DIAGNOSIS — L570 Actinic keratosis: Secondary | ICD-10-CM | POA: Diagnosis not present

## 2020-09-01 DIAGNOSIS — L578 Other skin changes due to chronic exposure to nonionizing radiation: Secondary | ICD-10-CM | POA: Diagnosis not present

## 2020-09-01 DIAGNOSIS — Z872 Personal history of diseases of the skin and subcutaneous tissue: Secondary | ICD-10-CM | POA: Diagnosis not present

## 2020-09-01 DIAGNOSIS — D485 Neoplasm of uncertain behavior of skin: Secondary | ICD-10-CM | POA: Diagnosis not present

## 2020-09-01 DIAGNOSIS — L400 Psoriasis vulgaris: Secondary | ICD-10-CM | POA: Diagnosis not present

## 2020-09-04 ENCOUNTER — Encounter: Payer: Self-pay | Admitting: Family

## 2020-09-04 ENCOUNTER — Other Ambulatory Visit: Payer: Self-pay

## 2020-09-04 ENCOUNTER — Ambulatory Visit: Payer: Medicare Other | Admitting: Family

## 2020-09-04 VITALS — BP 120/80 | HR 71 | Ht 68.0 in | Wt 156.0 lb

## 2020-09-04 DIAGNOSIS — I491 Atrial premature depolarization: Secondary | ICD-10-CM | POA: Diagnosis not present

## 2020-09-04 DIAGNOSIS — Z7901 Long term (current) use of anticoagulants: Secondary | ICD-10-CM | POA: Diagnosis not present

## 2020-09-04 DIAGNOSIS — I480 Paroxysmal atrial fibrillation: Secondary | ICD-10-CM

## 2020-09-04 DIAGNOSIS — I493 Ventricular premature depolarization: Secondary | ICD-10-CM

## 2020-09-04 NOTE — Patient Instructions (Signed)
Medication Instructions:  No medication changes today.   *If you need a refill on your cardiac medications before your next appointment, please call your pharmacy*   Lab Work: No lab work today.   Testing/Procedures: Your EKG today shows normal sinus rhythm with an occasional early beat called a PAC or PVC.   Follow-Up: At [facility], you and your health needs are our priority.  As part of our continuing mission to provide you with exceptional heart care, we have created designated Provider Care Teams.  These Care Teams include your primary Cardiologist (physician) and Advanced Practice Providers (APPs -  Physician Assistants and Nurse Practitioners) who all work together to provide you with the care you need, when you need it.  We recommend signing up for the patient portal called "MyChart".  Sign up information is provided on this After Visit Summary.  MyChart is used to connect with patients for Virtual Visits (Telemedicine).  Patients are able to view lab/test results, encounter notes, upcoming appointments, etc.  Non-urgent messages can be sent to your provider as well.   To learn more about what you can do with MyChart, go to [email or web address].    Your next appointment:   6 month(s)  The format for your next appointment:   In Person  Provider:   You may see Ida Rogue, MD or one of the following Advanced Practice Providers on your designated Care Team:    Murray Hodgkins, NP  Christell Faith, PA-C  Marrianne Mood, PA-C  Cadence [place], [place]  Laurann [place], NP  Other Instructions

## 2020-09-04 NOTE — Progress Notes (Signed)
Office Visit    Patient Name: Allison [name] Date of Encounter: 09/04/2020  Primary Care Provider:  Einar Pheasant, MD Primary Cardiologist:  Ida Rogue, MD Electrophysiologist:  None   Chief Complaint    Allison [name] is a 69 y.o. female with a hx of PAF on anticoagulation, GERD, HLD, HTN presents today for follow-up after increased dose of metoprolol  Past Medical History    Past Medical History:  Diagnosis Date  . A-fib ([place])   . GERD (gastroesophageal reflux disease)   . Hypercholesterolemia   . Hypertension   . Migraine headache   . Psoriasis    Past Surgical History:  Procedure Laterality Date  . COLONOSCOPY WITH PROPOFOL N/A 12/23/2016   Procedure: COLONOSCOPY WITH PROPOFOL;  Surgeon: Lollie Sails, MD;  Location: [facility] ENDOSCOPY;  Service: Endoscopy;  Laterality: N/A;  . DILATION AND CURETTAGE OF UTERUS     history of abnormal bleeding  . TUBAL LIGATION      Allergies  Allergies  Allergen Reactions  . Iodine     History of Present Illness    Allison [name] is a 69 y.o. female with a hx of PAF on anticoagulation, GERD, HLD, HTN last seen 07/30/2020  Previous cardiac workup includes echo 04/2019 with normal LV and RV function, no significant valvular abnormalities. She has a significant family history of atrial fibrillation as well as stroke.   Seen in the ED 07/26/20 for atrial fibrillation. She took her PRN Diltiazem 30mg  at home with improvement. Her BP was elevated at home 140/110 and presented to ED for evaluation. Her HTN and HR improved while waiting to be seen in the ED as she had taken her Diltiazem prior to arrival. HS-troponin negative x2, lab work unremarkable, CXR no acute findings.  She was seen in follow-up 07/30/2020.  Her Cardizem CD 120 mg daily was continued.  Her Toprol was increased from 50 to 75 mg for improved control of palpitations.   She presents today for follow-up.  She reports feeling well.  Feels her  palpitations have improved since increased dose of metoprolol.  One episode of palpitations a few nights ago for which she took her as needed diltiazem with good relief.  She tells me she has worked to increase her fluid intake to prevent being dehydrated.  She avoids caffeine.  She is looking forward to celebrating her birthday with her grandchildren at dinner this evening.  Reports no shortness of breath nor dyspnea on exertion. Reports no chest pain, pressure, or tightness. No edema, orthopnea, PND.   EKGs/Labs/Other Studies Reviewed:   The following studies were reviewed today:  EKG:  EKG is ordered today.  The ekg ordered today demonstrates NSR 71 bpm with occasional PAC and PVC. No acute ST/T wave changes.   Recent Labs: 09/13/2019: ALT 12 07/26/2020: BUN 12; Creatinine, Ser 0.83; Hemoglobin 15.1; Platelets 259; Potassium 3.8; Sodium 140; TSH 3.003  Recent Lipid Panel    Component Value Date/Time   CHOL 189 09/13/2019 0926   TRIG 73.0 09/13/2019 0926   HDL 63.70 09/13/2019 0926   CHOLHDL 3 09/13/2019 0926   VLDL 14.6 09/13/2019 0926   LDLCALC 110 (H) 09/13/2019 0926   LDLCALC 103 (H) 07/27/2018 1552    Home Medications   Current Meds  Medication Sig  . ALPRAZolam (XANAX) 0.25 MG tablet TAKE 1 TABLET BY MOUTH ONCE DAILY AS NEEDED  . diltiazem (CARDIZEM CD) 120 MG 24 hr capsule Take 1 capsule (120 mg total) by  mouth daily.  Marland Kitchen diltiazem (CARDIZEM) 30 MG tablet Take 1 tablet (30 mg total) by mouth 3 (three) times daily as needed (As needed for tachycardia/atrial fib).  . hyoscyamine (LEVSIN, ANASPAZ) 0.125 MG tablet TAKE 1 TABLET BY MOUTH EVERY 6 HOURS AS NEEDED  . metoprolol succinate (TOPROL-XL) 50 MG 24 hr tablet Take 1.5 tablets (75 mg total) by mouth daily. Take with or immediately following a meal.  . omeprazole (PRILOSEC) 20 MG capsule Take 1 capsule by mouth once daily  . potassium chloride (KLOR-CON) 10 MEQ tablet Take 1 tablet (10 mEq total) by mouth daily.  .  rivaroxaban (XARELTO) 20 MG TABS tablet Take 1 tablet (20 mg total) by mouth daily with supper.  . rosuvastatin (CRESTOR) 10 MG tablet Take 1 tablet by mouth once daily      Review of Systems    All other systems reviewed and are otherwise negative except as noted above.  Physical Exam    VS:  BP 120/80 (BP Location: Left Arm, Patient Position: Sitting, Cuff Size: Normal)   Pulse 71   Ht 5\' 8"  (1.727 m)   Wt 156 lb (70.8 kg)   SpO2 98%   BMI 23.72 kg/m  , BMI Body mass index is 23.72 kg/m. GEN: Well nourished, well developed, in no acute distress. HEENT: normal. Neck: Supple, no JVD, carotid bruits, or masses. Cardiac: irregular, no murmurs, rubs, or gallops. No clubbing, cyanosis, edema.  Radials/PT 2+ and equal bilaterally.  Respiratory:  Respirations regular and unlabored, clear to auscultation bilaterally. GI: Soft, nontender, nondistended, BS + x 4. MS: No deformity or atrophy. Skin: Warm and dry, no rash. Neuro:  Strength and sensation are intact. Psych: Normal affect.  Assessment & Plan    1. PAF/PAC/PVC -no evidence of recurrent atrial fibrillation.  Reports palpitations are well controlled.  Continue Cardizem CD 120 mg daily, Toprol 75 mg daily,PRN diltiazem 30 mg.  2. HTN - BP well controlled. Continue current antihypertensive regimen.   3. Chronic anticoagulation - Secondary to PAF and CHADS2VASC of at least 3 (HTN, female, age). Denies bleeding complications. Continue Xarelto 20mg  daily.  4. HLD - Continue Crestor 10mg .   5. Aortic atherosclerosis - Minimal per Dr. Rockey Situ review of CT images. Continue statin. No aspirin secondary to chronic anticoagulation.  No chest pain, pressure, tightness.  No indication for ischemia evaluation.  Disposition: Follow up in 6 month(s) with Dr. Rockey Situ or APP   Loel Dubonnet, NP 09/04/2020, 3:21 PM

## 2020-09-21 ENCOUNTER — Other Ambulatory Visit: Payer: Self-pay

## 2020-09-21 ENCOUNTER — Other Ambulatory Visit (INDEPENDENT_AMBULATORY_CARE_PROVIDER_SITE_OTHER): Payer: Medicare Other

## 2020-09-21 DIAGNOSIS — I10 Essential (primary) hypertension: Secondary | ICD-10-CM | POA: Diagnosis not present

## 2020-09-21 DIAGNOSIS — E7800 Pure hypercholesterolemia, unspecified: Secondary | ICD-10-CM

## 2020-09-21 DIAGNOSIS — R739 Hyperglycemia, unspecified: Secondary | ICD-10-CM | POA: Diagnosis not present

## 2020-09-21 DIAGNOSIS — Z23 Encounter for immunization: Secondary | ICD-10-CM | POA: Diagnosis not present

## 2020-09-21 LAB — LIPID PANEL
Cholesterol: 152 mg/dL (ref 0–200)
HDL: 57.3 mg/dL (ref >39.00)
LDL Cholesterol: 77 mg/dL (ref 0–99)
NonHDL: 94.23
Total CHOL/HDL Ratio: 3
Triglycerides: 84 mg/dL (ref 0.0–149.0)
VLDL: 16.8 mg/dL (ref 0.0–40.0)

## 2020-09-21 LAB — HEMOGLOBIN A1C: Hgb A1c MFr Bld: 5.8 % (ref 4.6–6.5)

## 2020-09-21 LAB — HEPATIC FUNCTION PANEL
ALT: 12 U/L (ref 0–35)
AST: 18 U/L (ref 0–37)
Albumin: 4.1 g/dL (ref 3.5–5.2)
Alkaline Phosphatase: 65 U/L (ref 39–117)
Bilirubin, Direct: 0.1 mg/dL (ref 0.0–0.3)
Total Bilirubin: 0.5 mg/dL (ref 0.2–1.2)
Total Protein: 7.1 g/dL (ref 6.0–8.3)

## 2020-09-21 LAB — BASIC METABOLIC PANEL
BUN: 14 mg/dL (ref 6–23)
CO2: 32 mEq/L (ref 19–32)
Calcium: 9.3 mg/dL (ref 8.4–10.5)
Chloride: 102 mEq/L (ref 96–112)
Creatinine, Ser: 0.79 mg/dL (ref 0.40–1.20)
GFR: 77.05 mL/min (ref >60.00)
Glucose, Bld: 99 mg/dL (ref 70–99)
Potassium: 4 mEq/L (ref 3.5–5.1)
Sodium: 140 mEq/L (ref 135–145)

## 2020-09-27 ENCOUNTER — Other Ambulatory Visit: Payer: Self-pay | Admitting: Cardiovascular Disease

## 2020-09-28 ENCOUNTER — Other Ambulatory Visit: Payer: Self-pay

## 2020-09-28 ENCOUNTER — Other Ambulatory Visit: Payer: Self-pay | Admitting: Cardiovascular Disease

## 2020-09-28 ENCOUNTER — Ambulatory Visit (INDEPENDENT_AMBULATORY_CARE_PROVIDER_SITE_OTHER): Payer: Medicare Other | Admitting: Internal Medicine

## 2020-09-28 ENCOUNTER — Encounter: Payer: Self-pay | Admitting: Internal Medicine

## 2020-09-28 VITALS — BP 122/74 | HR 67 | Temp 98.2°F | Ht 67.99 in | Wt 156.6 lb

## 2020-09-28 DIAGNOSIS — Z23 Encounter for immunization: Secondary | ICD-10-CM

## 2020-09-28 DIAGNOSIS — Z0000 Encounter for general adult medical examination without abnormal findings: Secondary | ICD-10-CM | POA: Diagnosis not present

## 2020-09-28 DIAGNOSIS — I10 Essential (primary) hypertension: Secondary | ICD-10-CM | POA: Diagnosis not present

## 2020-09-28 DIAGNOSIS — R739 Hyperglycemia, unspecified: Secondary | ICD-10-CM

## 2020-09-28 DIAGNOSIS — E7800 Pure hypercholesterolemia, unspecified: Secondary | ICD-10-CM

## 2020-09-28 DIAGNOSIS — I480 Paroxysmal atrial fibrillation: Secondary | ICD-10-CM

## 2020-09-28 DIAGNOSIS — K219 Gastro-esophageal reflux disease without esophagitis: Secondary | ICD-10-CM

## 2020-09-28 NOTE — Progress Notes (Signed)
Patient ID: JENIA KLEPPER, female   DOB: [birth year], 69 y.o.   MRN: [id number]   Subjective:    Patient ID: [name], female    DOB: 09/04/1951, 69 y.o.   MRN: [id number]  HPI This visit occurred during the SARS-CoV-2 public health emergency.  Safety protocols were in place, including screening questions prior to the visit, additional usage of staff PPE, and extensive cleaning of exam room while observing appropriate contact time as indicated for disinfecting solutions.  Patient here for her physical exam.  Here to follow up regarding her cholesterol, blood pressure and afib.  Tries to stay active.  Enjoying her new job.  Evaluated in ER 07/26/20 for afib.  Elevated blood pressure then as well.   Troponin negative x 2.  cxr ok.  Discharged with f/u with cardiology.  toprol increased to 34m q day.  Continues on cardizem. Has prn cardizem to take if needed.  Doing well.  No significant increased heart rate or palpitations now.  Saw cardiology 09/04/20.  No changes made. Continues xarelto.  Breathing stable.  Eating.  No acid reflux reported.  No abdominal pain or bowel change reported.    Past Medical History:  Diagnosis Date   A-fib ([facility]    GERD (gastroesophageal reflux disease)    Hypercholesterolemia    Hypertension    Migraine headache    Psoriasis    Past Surgical History:  Procedure Laterality Date   COLONOSCOPY WITH PROPOFOL N/A 12/23/2016   Procedure: COLONOSCOPY WITH PROPOFOL;  Surgeon: MLollie Sails MD;  [facility];  Service: Endoscopy;  Laterality: N/A;   DILATION AND CURETTAGE OF UTERUS     history of abnormal bleeding   TUBAL LIGATION     Family History  Problem Relation Age of Onset   Asthma Father    Diabetes Mother    CVA Mother    Psoriasis Mother    Breast cancer Maternal Grandmother    Breast cancer Paternal Grandmother    Colon cancer Maternal Uncle    Colon cancer Other        nephew   Diabetes Other         multiple relatives (both sides)   Stroke Brother        Light stroke   Diabetes Brother    Social History   Socioeconomic History   Marital status: Single    Spouse name: Not on file   Number of children: 1   Years of education: Not on file   Highest education level: Not on file  Occupational History   Not on file  Tobacco Use   Smoking status: Never Smoker   Smokeless tobacco: Never Used  Vaping Use   Vaping Use: Never used  Substance and Sexual Activity   Alcohol use: No    Alcohol/week: 0.0 standard drinks   Drug use: No   Sexual activity: Not on file  Other Topics Concern   Not on file  Social History Narrative   Not on file   Social Determinants of Health   Financial Resource Strain: Low Risk    Difficulty of Paying Living Expenses: Not hard at all  Food Insecurity: No Food Insecurity   Worried About RCharity fundraiserin the Last Year: Never true   RAndrewsin the Last Year: Never true  Transportation Needs: No Transportation Needs   Lack of Transportation (Medical): No   Lack of Transportation (Non-Medical): No  Physical Activity: Not on file  Stress: No Stress Concern Present   Feeling of Stress : Not at all  Social Connections: Unknown   Frequency of Communication with Friends and Family: More than three times a week   Frequency of Social Gatherings with Friends and Family: More than three times a week   Attends Religious Services: Not on file   Active Member of Clubs or Organizations: Not on file   Attends Archivist Meetings: Not on file   Marital Status: Not on file    Outpatient Encounter Medications as of 09/28/2020  Medication Sig   ALPRAZolam (XANAX) 0.25 MG tablet TAKE 1 TABLET BY MOUTH ONCE DAILY AS NEEDED   diltiazem (CARDIZEM CD) 120 MG 24 hr capsule Take 1 capsule by mouth once daily   diltiazem (CARDIZEM) 30 MG tablet Take 1 tablet (30 mg total) by mouth 3 (three) times daily as needed (As  needed for tachycardia/atrial fib).   hyoscyamine (LEVSIN, ANASPAZ) 0.125 MG tablet TAKE 1 TABLET BY MOUTH EVERY 6 HOURS AS NEEDED   metoprolol succinate (TOPROL-XL) 50 MG 24 hr tablet Take 1.5 tablets (75 mg total) by mouth daily. Take with or immediately following a meal.   omeprazole (PRILOSEC) 20 MG capsule Take 1 capsule by mouth once daily   potassium chloride (KLOR-CON) 10 MEQ tablet Take 1 tablet by mouth once daily   rivaroxaban (XARELTO) 20 MG TABS tablet Take 1 tablet (20 mg total) by mouth daily with supper.   rosuvastatin (CRESTOR) 10 MG tablet Take 1 tablet by mouth once daily   No facility-administered encounter medications on file as of 09/28/2020.    Review of Systems  Constitutional: Negative for appetite change and unexpected weight change.  HENT: Negative for congestion, sinus pressure and sore throat.   Eyes: Negative for pain and visual disturbance.  Respiratory: Negative for cough, chest tightness and shortness of breath.   Cardiovascular: Negative for chest pain, palpitations and leg swelling.  Gastrointestinal: Negative for abdominal pain, diarrhea, nausea and vomiting.  Genitourinary: Negative for difficulty urinating and dysuria.  Musculoskeletal: Negative for joint swelling and myalgias.  Skin: Negative for color change and rash.  Neurological: Negative for dizziness, light-headedness and headaches.  Hematological: Negative for adenopathy. Does not bruise/bleed easily.  Psychiatric/Behavioral: Negative for agitation and dysphoric mood.       Objective:    Physical Exam Vitals reviewed.  Constitutional:      General: She is not in acute distress.    Appearance: Normal appearance. She is well-developed and well-nourished.  HENT:     Head: Normocephalic and atraumatic.     Right Ear: External ear normal.     Left Ear: External ear normal.     Mouth/Throat:     Mouth: Oropharynx is clear and moist.  Eyes:     General: No scleral icterus.        Right eye: No discharge.        Left eye: No discharge.     Conjunctiva/sclera: Conjunctivae normal.  Neck:     Thyroid: No thyromegaly.  Cardiovascular:     Rate and Rhythm: Normal rate and regular rhythm.  Pulmonary:     Effort: No tachypnea, accessory muscle usage or respiratory distress.     Breath sounds: Normal breath sounds. No decreased breath sounds or wheezing.  Chest:  Breasts:     Right: No inverted nipple, mass, nipple discharge or tenderness (no axillary adenopathy).     Left: No inverted nipple, mass, nipple discharge or tenderness (no axilarry adenopathy).  Abdominal:     General: Bowel sounds are normal.     Palpations: Abdomen is soft.     Tenderness: There is no abdominal tenderness.  Musculoskeletal:        General: No swelling, tenderness or edema.     Cervical back: Neck supple. No tenderness.  Lymphadenopathy:     Cervical: No cervical adenopathy.  Skin:    Findings: No erythema or rash.  Neurological:     Mental Status: She is alert and oriented to person, place, and time.  Psychiatric:        Mood and Affect: Mood and affect and mood normal.        Behavior: Behavior normal.     BP 122/74 (BP Location: Left Arm, Patient Position: Sitting)    Pulse 67    Temp 98.2 F (36.8 C)    Ht 5' 7.99" (1.727 m)    Wt 156 lb 9.6 oz (71 kg)    SpO2 97%    BMI 23.82 kg/m  Wt Readings from Last 3 Encounters:  09/28/20 156 lb 9.6 oz (71 kg)  09/04/20 156 lb (70.8 kg)  07/30/20 154 lb 8 oz (70.1 kg)     Lab Results  Component Value Date   WBC 6.2 07/26/2020   HGB 15.1 (H) 07/26/2020   HCT 42.9 07/26/2020   PLT 259 07/26/2020   GLUCOSE 99 09/21/2020   CHOL 152 09/21/2020   TRIG 84.0 09/21/2020   HDL 57.30 09/21/2020   LDLCALC 77 09/21/2020   ALT 12 09/21/2020   AST 18 09/21/2020   NA 140 09/21/2020   K 4.0 09/21/2020   CL 102 09/21/2020   CREATININE 0.79 09/21/2020   BUN 14 09/21/2020   CO2 32 09/21/2020   TSH 3.003 07/26/2020   HGBA1C 5.8  09/21/2020    MM DIAG BREAST TOMO UNI LEFT  Result Date: 08/21/2020 CLINICAL DATA:  Possible asymmetry in the central left breast in the oblique projection of a recent screening mammogram. EXAM: DIGITAL DIAGNOSTIC UNILATERAL LEFT MAMMOGRAM WITH TOMO AND CAD COMPARISON:  Previous exam(s). ACR Breast Density Category b: There are scattered areas of fibroglandular density. FINDINGS: 3D tomographic and 2D generated true lateral and spot compression oblique images of the left breast were obtained. These demonstrate normal appearing fibroglandular tissue at the location of recently suspected asymmetry. Mammographic images were processed with CAD. IMPRESSION: No evidence of malignancy. The recently suspected left breast asymmetry was close apposition of normal breast tissue. RECOMMENDATION: Bilateral screening mammogram in 1 year. I have discussed the findings and recommendations with the patient. If applicable, a reminder letter will be sent to the patient regarding the next appointment. BI-RADS CATEGORY  1: Negative. Electronically Signed   By: Claudie Revering M.D.   On: 08/21/2020 15:26       Assessment & Plan:   Problem List Items Addressed This Visit    Paroxysmal atrial fibrillation ([place])    On xarelto.  Continues on diltiazem and metoprolol - 55m q day now.  Doing well on this regimen.  Has prn cardizem to take if needed.  Follow.       Hypertension    Blood pressure doing well.  On metoprolol and diltiazem.  Follow pressures.  Follow metabolic panel.       Relevant Orders   Basic metabolic panel   Hyperglycemia    Low carb diet and exercise.  Follow met b and a1c.       Relevant Orders   Hemoglobin A1c  Hypercholesterolemia    On pravastatin.  Low cholesterol diet and exercise.  Follow lipid panel and liver function tests.        Relevant Orders   Hepatic function panel   Lipid panel   Health care maintenance    Physical today 09/28/20.  PAP 07/2018 - negative with negative HPV.   Mammogram 08/04/20 - Birads 0.  Recommended f/u left breast mammogram.  F/u left breast mammogram 08/21/20 - Birads I.  colonoscoy 12/2016.       GERD (gastroesophageal reflux disease)    No acid reflux reported. On omeprazole.        Other Visit Diagnoses    Routine general medical examination at a health care facility    -  Primary   Need for 23-polyvalent pneumococcal polysaccharide vaccine       Relevant Orders   Pneumococcal polysaccharide vaccine 23-valent greater than or equal to 2yo subcutaneous/IM (Completed)       Einar Pheasant, MD

## 2020-10-04 ENCOUNTER — Encounter: Payer: Self-pay | Admitting: Internal Medicine

## 2020-10-04 NOTE — Assessment & Plan Note (Signed)
Physical today 09/28/20.  PAP 07/2018 - negative with negative HPV.  Mammogram 08/04/20 - Birads 0.  Recommended f/u left breast mammogram.  F/u left breast mammogram 08/21/20 - Birads I.  colonoscoy 12/2016.

## 2020-10-04 NOTE — Assessment & Plan Note (Signed)
Low carb diet and exercise.  Follow met b and a1c.  

## 2020-10-04 NOTE — Assessment & Plan Note (Signed)
On pravastatin.  Low cholesterol diet and exercise.  Follow lipid panel and liver function tests.   

## 2020-10-04 NOTE — Assessment & Plan Note (Signed)
Blood pressure doing well.  On metoprolol and diltiazem.  Follow pressures.  Follow metabolic panel.

## 2020-10-04 NOTE — Assessment & Plan Note (Signed)
On xarelto.  Continues on diltiazem and metoprolol - 75mg  q day now.  Doing well on this regimen.  Has prn cardizem to take if needed.  Follow.

## 2020-10-04 NOTE — Assessment & Plan Note (Signed)
No acid reflux reported.  On omeprazole.  

## 2020-11-01 ENCOUNTER — Other Ambulatory Visit: Payer: Self-pay | Admitting: Internal Medicine

## 2020-11-02 NOTE — Telephone Encounter (Signed)
rx ok'd for xanax #30 with no refills.   

## 2020-11-04 ENCOUNTER — Other Ambulatory Visit: Payer: Self-pay | Admitting: Internal Medicine

## 2020-11-17 ENCOUNTER — Other Ambulatory Visit: Payer: Self-pay | Admitting: Internal Medicine

## 2020-11-18 ENCOUNTER — Telehealth: Payer: Self-pay

## 2020-11-18 NOTE — Telephone Encounter (Signed)
Patient is not having any pain or any acute symptoms. Needs to have pelvic exam. She is scheduled for Friday. Stated she was ok to wait until then.

## 2020-11-18 NOTE — Telephone Encounter (Signed)
Pt said in her vaginal area there is something coming out of it. She said it is not discharge but something isn't right. She is not in any pain but wants it looked at. She also says it is real dry around her panty area. Dr. Nicki Reaper was full and there was an opening with Dr. Olivia Mackie on Friday at 3:30 that I scheduled her for. My question is do you think she need to be seen sooner?

## 2020-11-20 ENCOUNTER — Other Ambulatory Visit: Payer: Self-pay

## 2020-11-20 ENCOUNTER — Encounter: Payer: Self-pay | Admitting: Internal Medicine

## 2020-11-20 ENCOUNTER — Ambulatory Visit (INDEPENDENT_AMBULATORY_CARE_PROVIDER_SITE_OTHER): Payer: Medicare Other | Admitting: Internal Medicine

## 2020-11-20 VITALS — BP 120/70 | HR 60 | Temp 98.1°F | Ht 68.0 in | Wt 157.8 lb

## 2020-11-20 DIAGNOSIS — N8110 Cystocele, unspecified: Secondary | ICD-10-CM

## 2020-11-20 DIAGNOSIS — N951 Menopausal and female climacteric states: Secondary | ICD-10-CM | POA: Diagnosis not present

## 2020-11-20 NOTE — Progress Notes (Signed)
Chief Complaint  Patient presents with  . Vaginal Prolapse   Acute right sided vaginal area protruding and falling out and vaginal dryness x 2 weeks she walks a lot and has noticed this area but w/o pain or pressure   Review of Systems  Genitourinary: Negative for frequency.       Denies overactive bladder    Past Medical History:  Diagnosis Date  . A-fib ([place])   . GERD (gastroesophageal reflux disease)   . Hypercholesterolemia   . Hypertension   . Migraine headache   . Psoriasis    Past Surgical History:  Procedure Laterality Date  . COLONOSCOPY WITH PROPOFOL N/A 12/23/2016   Procedure: COLONOSCOPY WITH PROPOFOL;  Surgeon: Lollie Sails, MD;  Location: [facility] ENDOSCOPY;  Service: Endoscopy;  Laterality: N/A;  . DILATION AND CURETTAGE OF UTERUS     history of abnormal bleeding  . TUBAL LIGATION     Family History  Problem Relation Age of Onset  . Asthma Father   . Diabetes Mother   . CVA Mother   . Psoriasis Mother   . Breast cancer Maternal Grandmother   . Breast cancer Paternal Grandmother   . Colon cancer Maternal Uncle   . Colon cancer Other        nephew  . Diabetes Other        multiple relatives (both sides)  . Stroke Brother        Light stroke  . Diabetes Brother    Social History   Socioeconomic History  . Marital status: Single    Spouse name: Not on file  . Number of children: 1  . Years of education: Not on file  . Highest education level: Not on file  Occupational History  . Not on file  Tobacco Use  . Smoking status: Never Smoker  . Smokeless tobacco: Never Used  Vaping Use  . Vaping Use: Never used  Substance and Sexual Activity  . Alcohol use: No    Alcohol/week: 0.0 standard drinks  . Drug use: No  . Sexual activity: Not on file  Other Topics Concern  . Not on file  Social History Narrative  . Not on file   Social Determinants of Health   Financial Resource Strain: Low Risk   . Difficulty of Paying Living Expenses: Not hard  at all  Food Insecurity: No Food Insecurity  . Worried About Charity fundraiser in the Last Year: Never true  . Ran Out of Food in the Last Year: Never true  Transportation Needs: No Transportation Needs  . Lack of Transportation (Medical): No  . Lack of Transportation (Non-Medical): No  Physical Activity: Not on file  Stress: No Stress Concern Present  . Feeling of Stress : Not at all  Social Connections: Unknown  . Frequency of Communication with Friends and Family: More than three times a week  . Frequency of Social Gatherings with Friends and Family: More than three times a week  . Attends Religious Services: Not on file  . Active Member of Clubs or Organizations: Not on file  . Attends Archivist Meetings: Not on file  . Marital Status: Not on file  Intimate Partner Violence: Not At Risk  . Fear of Current or Ex-Partner: No  . Emotionally Abused: No  . Physically Abused: No  . Sexually Abused: No   Current Meds  Medication Sig  . ALPRAZolam (XANAX) 0.25 MG tablet Take 1 tablet (0.25 mg total) by mouth daily as  needed for anxiety.  Marland Kitchen diltiazem (CARDIZEM CD) 120 MG 24 hr capsule Take 1 capsule by mouth once daily  . diltiazem (CARDIZEM) 30 MG tablet Take 1 tablet (30 mg total) by mouth 3 (three) times daily as needed (As needed for tachycardia/atrial fib).  . hyoscyamine (LEVSIN, ANASPAZ) 0.125 MG tablet TAKE 1 TABLET BY MOUTH EVERY 6 HOURS AS NEEDED  . metoprolol succinate (TOPROL-XL) 50 MG 24 hr tablet Take 1.5 tablets (75 mg total) by mouth daily. Take with or immediately following a meal.  . omeprazole (PRILOSEC) 20 MG capsule Take 1 capsule by mouth once daily  . potassium chloride (KLOR-CON) 10 MEQ tablet Take 1 tablet by mouth once daily  . rivaroxaban (XARELTO) 20 MG TABS tablet Take 1 tablet (20 mg total) by mouth daily with supper.  . rosuvastatin (CRESTOR) 10 MG tablet Take 1 tablet by mouth once daily   Allergies  Allergen Reactions  . Iodine    Recent  Results (from the past 2160 hour(s))  Basic metabolic panel     Status: None   Collection Time: 09/21/20  8:09 AM  Result Value Ref Range   Sodium 140 135 - 145 mEq/L   Potassium 4.0 3.5 - 5.1 mEq/L   Chloride 102 96 - 112 mEq/L   CO2 32 19 - 32 mEq/L   Glucose, Bld 99 70 - 99 mg/dL   BUN 14 6 - 23 mg/dL   Creatinine, Ser 0.79 0.40 - 1.20 mg/dL   GFR 77.05 >60.00 mL/min    Comment: Calculated using the CKD-EPI Creatinine Equation (2021)   Calcium 9.3 8.4 - 10.5 mg/dL  Lipid panel     Status: None   Collection Time: 09/21/20  8:09 AM  Result Value Ref Range   Cholesterol 152 0 - 200 mg/dL    Comment: ATP III Classification       Desirable:  < 200 mg/dL               Borderline High:  200 - 239 mg/dL          High:  > = 240 mg/dL   Triglycerides 84.0 0.0 - 149.0 mg/dL    Comment: Normal:  <150 mg/dLBorderline High:  150 - 199 mg/dL   HDL 57.30 >39.00 mg/dL   VLDL 16.8 0.0 - 40.0 mg/dL   LDL Cholesterol 77 0 - 99 mg/dL   Total CHOL/HDL Ratio 3     Comment:                Men          Women1/2 Average Risk     3.4          3.3Average Risk          5.0          4.42X Average Risk          9.6          7.13X Average Risk          15.0          11.0                       NonHDL 94.23     Comment: NOTE:  Non-HDL goal should be 30 mg/dL higher than patient's LDL goal (i.e. LDL goal of < 70 mg/dL, would have non-HDL goal of < 100 mg/dL)  Hepatic function panel     Status: None   Collection Time: 09/21/20  8:09  AM  Result Value Ref Range   Total Bilirubin 0.5 0.2 - 1.2 mg/dL   Bilirubin, Direct 0.1 0.0 - 0.3 mg/dL   Alkaline Phosphatase 65 39 - 117 U/L   AST 18 0 - 37 U/L   ALT 12 0 - 35 U/L   Total Protein 7.1 6.0 - 8.3 g/dL   Albumin 4.1 3.5 - 5.2 g/dL  Hemoglobin A1c     Status: None   Collection Time: 09/21/20  8:09 AM  Result Value Ref Range   Hgb A1c MFr Bld 5.8 4.6 - 6.5 %    Comment: Glycemic Control Guidelines for People with Diabetes:Non Diabetic:  <6%Goal of Therapy:  <7%Additional Action Suggested:  >8%    Objective  Body mass index is 23.99 kg/m. Wt Readings from Last 3 Encounters:  11/20/20 157 lb 12.8 oz (71.6 kg)  09/28/20 156 lb 9.6 oz (71 kg)  09/04/20 156 lb (70.8 kg)   Temp Readings from Last 3 Encounters:  11/20/20 98.1 F (36.7 C) (Oral)  09/28/20 98.2 F (36.8 C)  07/26/20 98.5 F (36.9 C) (Oral)   BP Readings from Last 3 Encounters:  11/20/20 120/70  09/28/20 122/74  09/04/20 120/80   Pulse Readings from Last 3 Encounters:  11/20/20 60  09/28/20 67  09/04/20 71    Physical Exam Vitals and nursing note reviewed.  Constitutional:      Appearance: Normal appearance. She is well-developed and well-groomed.  HENT:     Head: Normocephalic and atraumatic.  Cardiovascular:     Rate and Rhythm: Normal rate.  Genitourinary:    Pubic Area: No rash.      Labia:        Right: No rash.        Left: No rash.      Vagina: Normal.     Cervix: Normal.     Comments: C/w bladder vs other female GU prolapse  Skin:    General: Skin is warm and dry.  Neurological:     General: No focal deficit present.     Mental Status: She is alert and oriented to person, place, and time. Mental status is at baseline.     Gait: Gait normal.  Psychiatric:        Attention and Perception: Attention and perception normal.        Mood and Affect: Mood and affect normal.        Speech: Speech normal.        Behavior: Behavior normal. Behavior is cooperative.        Thought Content: Thought content normal.        Cognition and Memory: Cognition and memory normal.        Judgment: Judgment normal.     Assessment  Plan  Female bladder prolapse - Plan: Ambulatory referral to Obstetrics / Gynecology Dr. Leafy Ro   Vaginal dryness, menopausal - Plan: Ambulatory referral to Obstetrics / Gynecology  Disc otc replens until can disc with ob/gyn   Provider: Dr. Olivia Mackie McLean-Scocuzza-Internal Medicine

## 2020-11-20 NOTE — Patient Instructions (Signed)
Bladder prolapse  Read about [facility] or [facility]   Pelvic Organ Prolapse Pelvic organ prolapse is a condition in women that involves the stretching, bulging, or dropping of pelvic organs into an abnormal position, past the opening of the vagina. It happens when the muscles and tissues that surround and support pelvic structures become weak or stretched. Pelvic organ prolapse can involve the:  Vagina (vaginal prolapse).  Uterus (uterine prolapse).  Bladder (cystocele).  Rectum (rectocele).  Intestines (enterocele). When organs other than the vagina are involved, they often bulge into the vagina or protrude from the vagina, depending on how severe the prolapse is. What are the causes? This condition may be caused by:  Pregnancy, labor, and childbirth.  Past pelvic surgery.  Lower levels of the hormone estrogen due to menopause.  Consistently lifting more than 50 lb (23 kg).  Obesity.  Long-term difficulty passing stool (chronic constipation).  Long-term, or chronic, cough.  Fluid buildup in the abdomen due to certain conditions. What are the signs or symptoms? Symptoms of this condition include:  Leaking a little urine (loss of bladder control) when you cough, sneeze, strain, and exercise (stress incontinence). This may be worse immediately after childbirth. It may gradually improve over time.  Feeling pressure in your pelvis or vagina. This pressure may increase when you cough or when you are passing stool.  A bulge that protrudes from the opening of your vagina.  Difficulty passing urine or stool.  Pain in your lower back.  Pain or discomfort during sex, or decreased interest in sex.  Repeated bladder infections (urinary tract infections).  Difficulty inserting a tampon. In some people, this condition causes no symptoms. How is this diagnosed? This condition may be diagnosed based on a vaginal and rectal exam. During the exam, you may be asked to cough and  strain while you are lying down, sitting, and standing up. Your health care provider will determine if other tests are required, such as bladder function tests. How is this treated? Treatment for this condition may depend on your symptoms. Treatment may include:  Lifestyle changes, such as drinking plenty of fluids and eating foods that are high in fiber.  Emptying your bladder at scheduled times (bladder training therapy). This can help reduce or avoid urinary incontinence.  Estrogen. This may help mild prolapse by increasing the strength and tone of pelvic floor muscles.  Kegel exercises. These may help mild cases of prolapse by strengthening and tightening the muscles of the pelvic floor.  A soft, flexible device that helps support the vaginal walls and keep pelvic organs in place (pessary). This is inserted into your vagina by your health care provider.  Surgery. This is often the only form of treatment for severe prolapse. Follow these instructions at home: Eating and drinking  Avoid drinking beverages that contain caffeine or alcohol.  Increase your intake of high-fiber foods to decrease constipation and straining during bowel movements. Activity  Lose weight if recommended by your health care provider.  Avoid heavy lifting and straining with exercise and work. Do not hold your breath when you perform mild to moderate lifting and exercise activities. Limit your activities as directed by your health care provider.  Do Kegel exercises as directed by your health care provider. To do this: ? Squeeze your pelvic floor muscles tight. You should feel a tight lift in your rectal area and a tightness in your vaginal area. Keep your stomach, buttocks, and legs relaxed. ? Hold the muscles tight for  up to 10 seconds. Then relax your muscles. ? Repeat this exercise 50 times a day, or as much as told by your health care provider. Continue to do this exercise for at least 4-6 weeks, or for as  long as told by your health care provider. General instructions  Take over-the-counter and prescription medicines only as told by your health care provider.  Wear a sanitary pad or adult diapers if you have urinary incontinence.  If you have a pessary, take care of it as told by your health care provider.  Keep all follow-up visits. This is important. Contact a health care provider if you:  Have symptoms that interfere with your daily activities or sex life.  Need medicine to help with the discomfort.  Notice bleeding from your vagina that is not related to your menstrual period.  Have a fever.  Have pain or bleeding when you urinate.  Have bleeding when you pass stool.  Pass urine when you have sex.  Have chronic constipation.  Have a pessary that falls out.  Have a foul-smelling vaginal discharge.  Have an unusual, low pain in your abdomen. Get help right away if you:  Cannot pass urine. Summary  Pelvic organ prolapse is the stretching, bulging, or dropping of pelvic organs into an abnormal position. It happens when the muscles and tissues that surround and support pelvic structures become weak or stretched.  When organs other than the vagina are involved, they often bulge into the vagina or protrude from it, depending on how severe the prolapse is.  In most cases, this condition needs to be treated only if it produces symptoms. Treatment may include lifestyle changes, estrogen, Kegel exercises, pessary insertion, or surgery.  Avoid heavy lifting and straining with exercise and work. Do not hold your breath when you perform mild to moderate lifting and exercise activities. Limit your activities as directed by your health care provider. This information is not intended to replace advice given to you by your health care provider. Make sure you discuss any questions you have with your health care provider. Document Revised: 04/06/2020 Document Reviewed:  04/06/2020 Elsevier Patient Education  [place].

## 2020-12-07 ENCOUNTER — Ambulatory Visit: Payer: Medicare Other | Admitting: Cardiovascular Disease

## 2020-12-13 ENCOUNTER — Encounter: Payer: Self-pay | Admitting: Emergency Medicine

## 2020-12-13 ENCOUNTER — Emergency Department
Admission: EM | Admit: 2020-12-13 | Discharge: 2020-12-13 | Disposition: A | Discharge status: Home / Self Care | Payer: Medicare Other | Attending: Emergency Medicine | Admitting: Emergency Medicine

## 2020-12-13 ENCOUNTER — Emergency Department: Payer: Medicare Other

## 2020-12-13 ENCOUNTER — Other Ambulatory Visit: Payer: Self-pay

## 2020-12-13 DIAGNOSIS — Z8679 Personal history of other diseases of the circulatory system: Secondary | ICD-10-CM | POA: Insufficient documentation

## 2020-12-13 DIAGNOSIS — I4891 Unspecified atrial fibrillation: Secondary | ICD-10-CM | POA: Diagnosis not present

## 2020-12-13 DIAGNOSIS — Z79899 Other long term (current) drug therapy: Secondary | ICD-10-CM | POA: Diagnosis not present

## 2020-12-13 DIAGNOSIS — Z7901 Long term (current) use of anticoagulants: Secondary | ICD-10-CM | POA: Diagnosis not present

## 2020-12-13 DIAGNOSIS — I10 Essential (primary) hypertension: Secondary | ICD-10-CM | POA: Insufficient documentation

## 2020-12-13 DIAGNOSIS — I491 Atrial premature depolarization: Secondary | ICD-10-CM | POA: Diagnosis not present

## 2020-12-13 DIAGNOSIS — R002 Palpitations: Secondary | ICD-10-CM | POA: Diagnosis not present

## 2020-12-13 DIAGNOSIS — R9431 Abnormal electrocardiogram [ECG] [EKG]: Secondary | ICD-10-CM | POA: Diagnosis not present

## 2020-12-13 LAB — COMPREHENSIVE METABOLIC PANEL
ALT: 14 U/L (ref 0–44)
AST: 22 U/L (ref 15–41)
Albumin: 4 g/dL (ref 3.5–5.0)
Alkaline Phosphatase: 64 U/L (ref 38–126)
Anion gap: 7 (ref 5–15)
BUN: 16 mg/dL (ref 8–23)
CO2: 27 mmol/L (ref 22–32)
Calcium: 9 mg/dL (ref 8.9–10.3)
Chloride: 104 mmol/L (ref 98–111)
Creatinine, Ser: 0.85 mg/dL (ref 0.44–1.00)
GFR, Estimated: >60 mL/min (ref >60)
Glucose, Bld: 108 mg/dL — ABNORMAL HIGH (ref 70–99)
Potassium: 4 mmol/L (ref 3.5–5.1)
Sodium: 138 mmol/L (ref 135–145)
Total Bilirubin: 0.6 mg/dL (ref 0.3–1.2)
Total Protein: 7.3 g/dL (ref 6.5–8.1)

## 2020-12-13 LAB — TROPONIN I (HIGH SENSITIVITY): Troponin I (High Sensitivity): 8 ng/L (ref <18)

## 2020-12-13 LAB — CBC
HCT: 41.2 % (ref 36.0–46.0)
Hemoglobin: 13.8 g/dL (ref 12.0–15.0)
MCH: 31.1 pg (ref 26.0–34.0)
MCHC: 33.5 g/dL (ref 30.0–36.0)
MCV: 92.8 fL (ref 80.0–100.0)
Platelets: 262 10*3/uL (ref 150–400)
RBC: 4.44 MIL/uL (ref 3.87–5.11)
RDW: 13.1 % (ref 11.5–15.5)
WBC: 6.8 10*3/uL (ref 4.0–10.5)
nRBC: 0 % (ref 0.0–0.2)

## 2020-12-13 LAB — PROTIME-INR
INR: 2.3 — ABNORMAL HIGH (ref 0.8–1.2)
Prothrombin Time: 24.2 seconds — ABNORMAL HIGH (ref 11.4–15.2)

## 2020-12-13 NOTE — ED Triage Notes (Signed)
First rn note:Pt states she feels like she is having palpitations when she sits down. Pt states she has taken "all my medications for it" tonight. Pt appears in no acute distress.

## 2020-12-13 NOTE — ED Notes (Signed)
Pt presents via triage for c/o palpitations intermittently x 3 days. Pt reports she felt palpitations more frequently today.  Pt sts palpitations are not worse with activity.  Denies chest pain, SOB, or dizziness. Pt sts she has had recent increased family stress with a family member who is ill. Pt reports today she took an extra dose of 30mg  diltiazem as directed by her doctor.

## 2020-12-13 NOTE — ED Triage Notes (Signed)
Pt c/o of Herat Flutter" since Wednesday night (stress) and since worse when sitting, pt takes Xelralto for afib  Pt denies SOB or dizziness, pt has been able to tolerate light activity at home  Pt reports taking metoprolol and Xelralto and alprazolam tonight

## 2020-12-13 NOTE — ED Provider Notes (Signed)
[facility] Emergency Department Provider Note   ____________________________________________   Event Date/Time   First MD Initiated Contact with Patient 12/13/20 2010     (approximate)  I have reviewed the triage vital signs and the nursing notes.   HISTORY  Chief Complaint Palpitations    HPI Allison [name] is a 70 y.o. female with past medical story of hypertension, hyperlipidemia, atrial fibrillation on Xarelto, and GERD who presents to the ED for palpitations.  Patient reports that for the past couple of days she has intermittently felt like her heart has brief runs of extra beats.  We will have been once or twice a minute for only a few beats at a time before resolving.  She denies any associated pain in her chest or difficulty breathing.  Symptoms feel similar to prior episodes of atrial fibrillation, although she has been compliant with her usual metoprolol, diltiazem, and Xarelto.  She denies any fevers, nausea, vomiting, diarrhea, dysuria, or hematuria.        Past Medical History:  Diagnosis Date  . A-fib ([place])   . GERD (gastroesophageal reflux disease)   . Hypercholesterolemia   . Hypertension   . Migraine headache   . Psoriasis     Patient Active Problem List   Diagnosis Date Noted  . Hyperglycemia 05/13/2019  . Paroxysmal atrial fibrillation ([place]) 02/03/2019  . Loose stools 07/23/2017  . Weight loss 07/23/2017  . 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 LLQ abdominal pain 09/14/2016  . Diverticulosis 04/18/2016  . Right shoulder pain 01/24/2016  . Health care maintenance 05/04/2015  . Leg cramps 05/01/2015  . Unspecified constipation 07/13/2014  . Eye irritation 11/14/2013  . Hypertension 10/15/2012  . Hypercholesterolemia 10/15/2012  . GERD (gastroesophageal reflux disease) 10/15/2012  . Migraine headache 10/15/2012    Past Surgical History:  Procedure Laterality Date  . COLONOSCOPY WITH PROPOFOL N/A 12/23/2016   Procedure:  COLONOSCOPY WITH PROPOFOL;  Surgeon: Lollie Sails, MD;  Location: [facility] ENDOSCOPY;  Service: Endoscopy;  Laterality: N/A;  . DILATION AND CURETTAGE OF UTERUS     history of abnormal bleeding  . TUBAL LIGATION      Prior to Admission medications   Medication Sig Start Date End Date Taking? Authorizing Provider  ALPRAZolam (XANAX) 0.25 MG tablet Take 1 tablet (0.25 mg total) by mouth daily as needed for anxiety. 11/02/20   Einar Pheasant, MD  diltiazem (CARDIZEM CD) 120 MG 24 hr capsule Take 1 capsule by mouth once daily 09/28/20   Minna Merritts, MD  diltiazem (CARDIZEM) 30 MG tablet Take 1 tablet (30 mg total) by mouth 3 (three) times daily as needed (As needed for tachycardia/atrial fib). 05/04/20   Minna Merritts, MD  hyoscyamine (LEVSIN, ANASPAZ) 0.125 MG tablet TAKE 1 TABLET BY MOUTH EVERY 6 HOURS AS NEEDED 12/31/18   Einar Pheasant, MD  metoprolol succinate (TOPROL-XL) 50 MG 24 hr tablet Take 1.5 tablets (75 mg total) by mouth daily. Take with or immediately following a meal. 07/30/20   Loel Dubonnet, NP  omeprazole (PRILOSEC) 20 MG capsule Take 1 capsule by mouth once daily 11/17/20   Einar Pheasant, MD  potassium chloride (KLOR-CON) 10 MEQ tablet Take 1 tablet by mouth once daily 09/28/20   Wellington Hampshire, MD  rivaroxaban (XARELTO) 20 MG TABS tablet Take 1 tablet (20 mg total) by mouth daily with supper. 02/05/20   Minna Merritts, MD  rosuvastatin (CRESTOR) 10 MG tablet Take 1 tablet by mouth once daily 11/05/20  Einar Pheasant, MD    Allergies Iodine  Family History  Problem Relation Age of Onset  . Asthma Father   . Diabetes Mother   . CVA Mother   . Psoriasis Mother   . Breast cancer Maternal Grandmother   . Breast cancer Paternal Grandmother   . Colon cancer Maternal Uncle   . Colon cancer Other        nephew  . Diabetes Other        multiple relatives (both sides)  . Stroke Brother        Light stroke  . Diabetes Brother     Social History Social  History   Tobacco Use  . Smoking status: Never Smoker  . Smokeless tobacco: Never Used  Vaping Use  . Vaping Use: Never used  Substance Use Topics  . Alcohol use: No    Alcohol/week: 0.0 standard drinks  . Drug use: No    Review of Systems  Constitutional: No fever/chills Eyes: No visual changes. ENT: No sore throat. Cardiovascular: Denies chest pain.  Positive for palpitations. Respiratory: Denies shortness of breath. Gastrointestinal: No abdominal pain.  No nausea, no vomiting.  No diarrhea.  No constipation. Genitourinary: Negative for dysuria. Musculoskeletal: Negative for back pain. Skin: Negative for rash. Neurological: Negative for headaches, focal weakness or numbness.  ____________________________________________   PHYSICAL EXAM:  VITAL SIGNS: ED Triage Vitals  Enc Vitals Group     BP 12/13/20 1951 (!) 147/71     Pulse Rate 12/13/20 1951 63     Resp 12/13/20 1951 18     Temp 12/13/20 1951 98 F (36.7 C)     Temp Source 12/13/20 1951 Oral     SpO2 12/13/20 1951 98 %     Weight 12/13/20 1952 155 lb (70.3 kg)     Height 12/13/20 1952 5\' 8"  (1.727 m)     Head Circumference --      Peak Flow --      Pain Score 12/13/20 1958 0     Pain Loc --      Pain Edu? --      Excl. in [place]? --     Constitutional: Alert and oriented. Eyes: Conjunctivae are normal. Head: Atraumatic. Nose: No congestion/rhinnorhea. Mouth/Throat: Mucous membranes are moist. Neck: Normal ROM Cardiovascular: Normal rate, irregularly irregular rhythm. Grossly normal heart sounds.  2+ radial pulses bilaterally. Respiratory: Normal respiratory effort.  No retractions. Lungs CTAB. Gastrointestinal: Soft and nontender. No distention. Genitourinary: deferred Musculoskeletal: No lower extremity tenderness nor edema. Neurologic:  Normal speech and language. No gross focal neurologic deficits are appreciated. Skin:  Skin is warm, dry and intact. No rash noted. Psychiatric: Mood and affect are  normal. Speech and behavior are normal.  ____________________________________________   LABS (all labs ordered are listed, but only abnormal results are displayed)  Labs Reviewed  COMPREHENSIVE METABOLIC PANEL - Abnormal; Notable for the following components:      Result Value   Glucose, Bld 108 (*)    All other components within normal limits  PROTIME-INR - Abnormal; Notable for the following components:   Prothrombin Time 24.2 (*)    INR 2.3 (*)    All other components within normal limits  CBC  TROPONIN I (HIGH SENSITIVITY)   ____________________________________________  EKG  ED ECG REPORT I, Blake Divine, the attending physician, personally viewed and interpreted this ECG.   Date: 12/13/2020  EKG Time: 19:49  Rate: 70  Rhythm: normal sinus rhythm, PAC's noted  Axis: Normal  Intervals:none  ST&T Change: None   PROCEDURES  Procedure(s) performed (including Critical Care):  Procedures   ____________________________________________   INITIAL IMPRESSION / ASSESSMENT AND PLAN / ED COURSE       70 year old female with past medical history of hypertension, hyperlipidemia, atrial fibrillation on Xarelto, and GERD who presents to the ED for palpitations intermittently over the past couple of days.  EKG shows normal sinus rhythm with what appears to be frequent runs of PACs, no ischemic changes noted and patient denies chest pain.  We will screen labs including electrolytes and troponin.  Patient has already been taking extra doses of diltiazem to control her rate.  Plan to discuss with cardiology, but if work-up is unremarkable she would likely be appropriate for discharge home with close cardiology follow-up.  Lab work is unremarkable, case discussed with Dr. Fletcher Anon of cardiology who agrees patient would be appropriate for outpatient follow-up.  She was counseled to return to the ED for any new or worsening symptoms, patient agrees with plan.       ____________________________________________   FINAL CLINICAL IMPRESSION(S) / ED DIAGNOSES  Final diagnoses:  Heart palpitations  Premature atrial beats     ED Discharge Orders    None       Note:  This document was prepared using Dragon voice recognition software and may include unintentional dictation errors.   Blake Divine, MD 12/13/20 2209

## 2020-12-13 NOTE — ED Notes (Signed)
ED Provider at bedside. 

## 2020-12-13 NOTE — ED Notes (Signed)
Patient transported to X-ray 

## 2020-12-22 ENCOUNTER — Other Ambulatory Visit: Payer: Self-pay

## 2020-12-22 ENCOUNTER — Encounter: Payer: Self-pay | Admitting: Family

## 2020-12-22 ENCOUNTER — Ambulatory Visit: Payer: Medicare Other | Admitting: Family

## 2020-12-22 VITALS — BP 110/60 | HR 61 | Ht 68.0 in | Wt 157.0 lb

## 2020-12-22 DIAGNOSIS — Z7901 Long term (current) use of anticoagulants: Secondary | ICD-10-CM | POA: Diagnosis not present

## 2020-12-22 DIAGNOSIS — I480 Paroxysmal atrial fibrillation: Secondary | ICD-10-CM | POA: Diagnosis not present

## 2020-12-22 DIAGNOSIS — I491 Atrial premature depolarization: Secondary | ICD-10-CM

## 2020-12-22 DIAGNOSIS — E782 Mixed hyperlipidemia: Secondary | ICD-10-CM

## 2020-12-22 DIAGNOSIS — I10 Essential (primary) hypertension: Secondary | ICD-10-CM | POA: Diagnosis not present

## 2020-12-22 NOTE — Patient Instructions (Signed)
Medication Instructions:  No medication changes today.   *If you need a refill on your cardiac medications before your next appointment, please call your pharmacy*  Lab Work: None ordered today.   Testing/Procedures: Your EKG today shows normal sinus rhythm.   Follow-Up: At [facility], you and your health needs are our priority.  As part of our continuing mission to provide you with exceptional heart care, we have created designated Provider Care Teams.  These Care Teams include your primary Cardiologist (physician) and Advanced Practice Providers (APPs -  Physician Assistants and Nurse Practitioners) who all work together to provide you with the care you need, when you need it.  We recommend signing up for the patient portal called "MyChart".  Sign up information is provided on this After Visit Summary.  MyChart is used to connect with patients for Virtual Visits (Telemedicine).  Patients are able to view lab/test results, encounter notes, upcoming appointments, etc.  Non-urgent messages can be sent to your provider as well.   To learn more about what you can do with MyChart, go to [email or web address].    Your next appointment:   As scheduled in May with Dr. Rockey Situ  If you decide you wish to push this appointment out, that would be fine. Simply call our office and request to be moved to June - August.   Other Instructions  Your EKG in the emergency department showed occasional early beats. This is not dangerous but can feel like your heart races or flutters.Your lab work was normal and your chest x-ray looked great!  If you notice more palpitations and they are bothersome - we can order a ZIO heart monitor, simply call our office to let us know. We can either bring you in to have it placed or mail it to you.  Heart Healthy Diet Recommendations: A low-salt diet is recommended. Meats should be grilled, baked, or boiled. Avoid fried foods. Focus on lean protein sources like fish or  chicken with vegetables and fruits. The American Heart Association is a Microbiologist!  American Heart Association Diet and Lifeystyle Recommendations   Exercise recommendations: The American Heart Association recommends 150 minutes of moderate intensity exercise weekly. Try 30 minutes of moderate intensity exercise 4-5 times per week. This could include walking, jogging, or swimming.

## 2020-12-22 NOTE — Progress Notes (Signed)
Office Visit    Patient Name: Allison [name] Date of Encounter: 12/22/2020  Primary Care Provider:  Einar Pheasant, MD Primary Cardiologist:  Ida Rogue, MD Electrophysiologist:  None   Chief Complaint    Allison [name] is a 70 y.o. female with a hx of PAF on anticoagulation, GERD, HLD, HTN presents today for follow-up after ED visit for palpitations.  Past Medical History    Past Medical History:  Diagnosis Date  . A-fib ([place])   . GERD (gastroesophageal reflux disease)   . Hypercholesterolemia   . Hypertension   . Migraine headache   . Psoriasis    Past Surgical History:  Procedure Laterality Date  . COLONOSCOPY WITH PROPOFOL N/A 12/23/2016   Procedure: COLONOSCOPY WITH PROPOFOL;  Surgeon: Lollie Sails, MD;  [facility] END[facility]COPY;  Service: Endoscopy;  Laterality: N/A;  . DILATION AND CURETTAGE OF UTERUS     history of abnormal bleeding  . TUBAL LIGATION      Allergies  Allergies  Allergen Reactions  . Iodine     History of Present Illness    Allison [name] is a 70 y.o. female with a hx of PAF on anticoagulation, GERD, HLD, HTN last seen 09/04/20.  Previous cardiac workup includes echo 04/2019 with normal LV and RV function, no significant valvular abnormalities. She has a significant family history of atrial fibrillation as well as stroke.   Seen in the ED 07/26/20 for atrial fibrillation. She took her PRN Diltiazem 30mg  at home with improvement. Her BP was elevated at home 140/110 and presented to ED for evaluation. Her HTN and HR improved while waiting to be seen in the ED as she had taken her Diltiazem prior to arrival. HS-troponin negative x2, lab work unremarkable, CXR no acute findings.  She was seen in follow-up 07/30/2020.  Her Cardizem CD 120 mg daily was continued.  Her Toprol was increased from 50 to 75 mg for improved control of palpitations.   Seen in follow up 09/04/20 with improvement in control of palpitations since up  titration of Toprol.   Seen in the ED 12/13/20 due to palpitations.  EKG showed NSR with frequent runs of PACs.  Lab work included K4.0, creatinine 0.85, GFR greater than 60, hemoglobin 13.8, high-sensitivity troponin 8. CXR unremarkable. She was recommended to continue her present medications including PRN Diltiazem for rate control.  Tells me her heart has "settled down" since being seen in the ED. Tells me she is trying to learn to not "rush to the hospital" when she could take care of it with the extra PRN diltiazem. Reports only mild intermittent palpitations when laying down. Tells me her cousin is in the hospital for [place] right now which has been understandably stressful and she believes this may have caused her episode of palpitations as that day there was discussion about him transferring to SNF.  Reports no shortness of breath nor dyspnea on exertion. Reports no chest pain, pressure, or tightness. No edema, orthopnea, PND.  EKGs/Labs/Other Studies Reviewed:   The following studies were reviewed today:  EKG:  EKG is ordered today.  The ekg ordered today demonstrates NSR 61 bpm with left axis deviation and incomplete RBBB. No acute ST/T wave changes.   Recent Labs: 07/26/2020: TSH 3.003 12/13/2020: ALT 14; BUN 16; Creatinine, Ser 0.85; Hemoglobin 13.8; Platelets 262; Potassium 4.0; Sodium 138  Recent Lipid Panel    Component Value Date/Time   CHOL 152 09/21/2020 0809   TRIG 84.0 09/21/2020 0809  HDL 57.30 09/21/2020 0809   CHOLHDL 3 09/21/2020 0809   VLDL 16.8 09/21/2020 0809   LDLCALC 77 09/21/2020 0809   LDLCALC 103 (H) 07/27/2018 1552    Home Medications   Current Meds  Medication Sig  . ALPRAZolam (XANAX) 0.25 MG tablet Take 1 tablet (0.25 mg total) by mouth daily as needed for anxiety.  Marland Kitchen diltiazem (CARDIZEM CD) 120 MG 24 hr capsule Take 1 capsule by mouth once daily  . diltiazem (CARDIZEM) 30 MG tablet Take 1 tablet (30 mg total) by mouth 3 (three) times daily as needed  (As needed for tachycardia/atrial fib).  . hyoscyamine (LEVSIN, ANASPAZ) 0.125 MG tablet TAKE 1 TABLET BY MOUTH EVERY 6 HOURS AS NEEDED  . metoprolol succinate (TOPROL-XL) 50 MG 24 hr tablet Take 1.5 tablets (75 mg total) by mouth daily. Take with or immediately following a meal.  . omeprazole (PRILOSEC) 20 MG capsule Take 1 capsule by mouth once daily  . potassium chloride (KLOR-CON) 10 MEQ tablet Take 1 tablet by mouth once daily  . rivaroxaban (XARELTO) 20 MG TABS tablet Take 1 tablet (20 mg total) by mouth daily with supper.  . rosuvastatin (CRESTOR) 10 MG tablet Take 1 tablet by mouth once daily    Review of Systems   All other systems reviewed and are otherwise negative except as noted above.  Physical Exam   VS:  BP 110/60 (BP Location: Left Arm, Patient Position: Sitting, Cuff Size: Normal)   Pulse 61   Ht 5\' 8"  (1.727 m)   Wt 157 lb (71.2 kg)   BMI 23.87 kg/m  , BMI Body mass index is 23.87 kg/m. GEN: Well nourished, well developed, in no acute distress. HEENT: normal. Neck: Supple, no JVD or masses. Cardiac: irregular, no murmurs, rubs, or gallops. No clubbing, cyanosis, edema.  Radials/PT 2+ and equal bilaterally.  Respiratory:  Respirations regular and unlabored, clear to auscultation bilaterally. GI: Soft, nontender, nondistended. MS: No deformity or atrophy. Skin: Warm and dry, no rash. Neuro:  Strength and sensation are intact. Psych: Normal affect.  Assessment & Plan    1. PAF/PAC/PVC - Recent ED visit with PACs in setting of stress/excitement over her cousin being moved from hospital to SNF. Palpitations have improved compared to previous. Will defer escalation of Toprol 75QD or Cardizem CD 120mg  QD due to low normal BP and HR. Denies lightheadedness, dizziness. Continue Diltiazem 30mg  PRN for breakthrough palpitations. Reassurance provided and she was appreciative. We did discuss possible ZIO monitor which she politely declines at this time. Discussed prevention of  palpitations by staying well hydrated, avoiding caffeine, managing stress well.   2. HTN - BP well controlled. Continue current antihypertensive regimen.   3. Chronic anticoagulation - Secondary to PAF and CHADS2VASC of at least 3 (HTN, female, age). Denies bleeding complications. Continue Xarelto 20mg  QD.  4. HLD - Continue Crestor 10mg  QD.  5. Aortic atherosclerosis - Minimal per Dr. Rockey Situ review of CT images. Continue statin. No aspirin secondary to chronic anticoagulation.  No chest pain, pressure, tightness.  No indication for ischemia evaluation.  Disposition: Follow up in May as scheduled with Dr. Rockey Situ.  Loel Dubonnet, NP 12/22/2020, 8:58 AM

## 2021-01-04 DIAGNOSIS — M955 Acquired deformity of pelvis: Secondary | ICD-10-CM | POA: Diagnosis not present

## 2021-01-04 DIAGNOSIS — M9903 Segmental and somatic dysfunction of lumbar region: Secondary | ICD-10-CM | POA: Diagnosis not present

## 2021-01-04 DIAGNOSIS — M9902 Segmental and somatic dysfunction of thoracic region: Secondary | ICD-10-CM | POA: Diagnosis not present

## 2021-01-04 DIAGNOSIS — M546 Pain in thoracic spine: Secondary | ICD-10-CM | POA: Diagnosis not present

## 2021-01-04 DIAGNOSIS — M5442 Lumbago with sciatica, left side: Secondary | ICD-10-CM | POA: Diagnosis not present

## 2021-01-04 DIAGNOSIS — M9905 Segmental and somatic dysfunction of pelvic region: Secondary | ICD-10-CM | POA: Diagnosis not present

## 2021-01-11 DIAGNOSIS — M955 Acquired deformity of pelvis: Secondary | ICD-10-CM | POA: Diagnosis not present

## 2021-01-11 DIAGNOSIS — M9905 Segmental and somatic dysfunction of pelvic region: Secondary | ICD-10-CM | POA: Diagnosis not present

## 2021-01-11 DIAGNOSIS — M546 Pain in thoracic spine: Secondary | ICD-10-CM | POA: Diagnosis not present

## 2021-01-11 DIAGNOSIS — M5442 Lumbago with sciatica, left side: Secondary | ICD-10-CM | POA: Diagnosis not present

## 2021-01-11 DIAGNOSIS — M9902 Segmental and somatic dysfunction of thoracic region: Secondary | ICD-10-CM | POA: Diagnosis not present

## 2021-01-11 DIAGNOSIS — M9903 Segmental and somatic dysfunction of lumbar region: Secondary | ICD-10-CM | POA: Diagnosis not present

## 2021-01-20 DIAGNOSIS — R0781 Pleurodynia: Secondary | ICD-10-CM | POA: Diagnosis not present

## 2021-01-20 DIAGNOSIS — S29011A Strain of muscle and tendon of front wall of thorax, initial encounter: Secondary | ICD-10-CM | POA: Diagnosis not present

## 2021-01-23 ENCOUNTER — Other Ambulatory Visit: Payer: Self-pay | Admitting: Family

## 2021-01-25 ENCOUNTER — Encounter: Payer: Self-pay | Admitting: Internal Medicine

## 2021-01-27 ENCOUNTER — Other Ambulatory Visit: Payer: Self-pay | Admitting: Internal Medicine

## 2021-01-27 ENCOUNTER — Telehealth: Payer: Self-pay | Admitting: Cardiovascular Disease

## 2021-01-27 ENCOUNTER — Other Ambulatory Visit: Payer: Self-pay

## 2021-01-27 ENCOUNTER — Other Ambulatory Visit (INDEPENDENT_AMBULATORY_CARE_PROVIDER_SITE_OTHER): Payer: Medicare Other

## 2021-01-27 DIAGNOSIS — R739 Hyperglycemia, unspecified: Secondary | ICD-10-CM

## 2021-01-27 DIAGNOSIS — I10 Essential (primary) hypertension: Secondary | ICD-10-CM

## 2021-01-27 DIAGNOSIS — E7800 Pure hypercholesterolemia, unspecified: Secondary | ICD-10-CM | POA: Diagnosis not present

## 2021-01-27 LAB — LIPID PANEL
Cholesterol: 142 mg/dL (ref 0–200)
HDL: 54.7 mg/dL (ref >39.00)
LDL Cholesterol: 75 mg/dL (ref 0–99)
NonHDL: 87.5
Total CHOL/HDL Ratio: 3
Triglycerides: 63 mg/dL (ref 0.0–149.0)
VLDL: 12.6 mg/dL (ref 0.0–40.0)

## 2021-01-27 LAB — BASIC METABOLIC PANEL
BUN: 18 mg/dL (ref 6–23)
CO2: 32 mEq/L (ref 19–32)
Calcium: 9.3 mg/dL (ref 8.4–10.5)
Chloride: 103 mEq/L (ref 96–112)
Creatinine, Ser: 0.8 mg/dL (ref 0.40–1.20)
GFR: 75.71 mL/min (ref >60.00)
Glucose, Bld: 103 mg/dL — ABNORMAL HIGH (ref 70–99)
Potassium: 4.6 mEq/L (ref 3.5–5.1)
Sodium: 140 mEq/L (ref 135–145)

## 2021-01-27 LAB — HEMOGLOBIN A1C: Hgb A1c MFr Bld: 5.9 % (ref 4.6–6.5)

## 2021-01-27 LAB — HEPATIC FUNCTION PANEL
ALT: 11 U/L (ref 0–35)
AST: 18 U/L (ref 0–37)
Albumin: 4 g/dL (ref 3.5–5.2)
Alkaline Phosphatase: 74 U/L (ref 39–117)
Bilirubin, Direct: 0.1 mg/dL (ref 0.0–0.3)
Total Bilirubin: 0.4 mg/dL (ref 0.2–1.2)
Total Protein: 6.7 g/dL (ref 6.0–8.3)

## 2021-01-27 MED ORDER — METOPROLOL SUCCINATE ER 50 MG PO TB24: ORAL_TABLET | ORAL | 3 refills | Status: DC

## 2021-01-27 NOTE — Telephone Encounter (Signed)
Patient calling in stating at the [place] she went to pick up her metoprolol Rx. She usually gets a 90 days supply and this past time it was 30 days  Patient would like to be advised on the reason for change

## 2021-01-27 NOTE — Telephone Encounter (Signed)
Returned patient's call, no answer or voicemail to leave a message. Prescription was sent in for #30, not sure why but we can send in #90, need to verify if patient has already picked up this medication

## 2021-01-29 ENCOUNTER — Ambulatory Visit: Payer: Medicare Other | Admitting: Internal Medicine

## 2021-01-30 ENCOUNTER — Other Ambulatory Visit: Payer: Self-pay | Admitting: Internal Medicine

## 2021-02-10 ENCOUNTER — Other Ambulatory Visit: Payer: Self-pay | Admitting: Internal Medicine

## 2021-02-22 ENCOUNTER — Other Ambulatory Visit: Payer: Self-pay

## 2021-02-25 ENCOUNTER — Other Ambulatory Visit: Payer: Self-pay

## 2021-02-25 ENCOUNTER — Ambulatory Visit (INDEPENDENT_AMBULATORY_CARE_PROVIDER_SITE_OTHER): Payer: Medicare Other | Admitting: Internal Medicine

## 2021-02-25 ENCOUNTER — Encounter: Payer: Self-pay | Admitting: Internal Medicine

## 2021-02-25 DIAGNOSIS — I10 Essential (primary) hypertension: Secondary | ICD-10-CM

## 2021-02-25 DIAGNOSIS — I480 Paroxysmal atrial fibrillation: Secondary | ICD-10-CM

## 2021-02-25 DIAGNOSIS — R739 Hyperglycemia, unspecified: Secondary | ICD-10-CM

## 2021-02-25 DIAGNOSIS — K219 Gastro-esophageal reflux disease without esophagitis: Secondary | ICD-10-CM | POA: Diagnosis not present

## 2021-02-25 DIAGNOSIS — M810 Age-related osteoporosis without current pathological fracture: Secondary | ICD-10-CM | POA: Diagnosis not present

## 2021-02-25 DIAGNOSIS — S2232XD Fracture of one rib, left side, subsequent encounter for fracture with routine healing: Secondary | ICD-10-CM

## 2021-02-25 DIAGNOSIS — E7800 Pure hypercholesterolemia, unspecified: Secondary | ICD-10-CM | POA: Diagnosis not present

## 2021-02-25 DIAGNOSIS — S2239XA Fracture of one rib, unspecified side, initial encounter for closed fracture: Secondary | ICD-10-CM | POA: Insufficient documentation

## 2021-02-25 LAB — URINALYSIS, ROUTINE W REFLEX MICROSCOPIC
Bilirubin Urine: NEGATIVE
Hgb urine dipstick: NEGATIVE
Ketones, ur: NEGATIVE
Nitrite: NEGATIVE
Specific Gravity, Urine: 1.015 (ref 1.000–1.030)
Total Protein, Urine: NEGATIVE
Urine Glucose: NEGATIVE
Urobilinogen, UA: 0.2 (ref 0.0–1.0)
pH: 7.5 (ref 5.0–8.0)

## 2021-02-25 LAB — VITAMIN D 25 HYDROXY (VIT D DEFICIENCY, FRACTURES): VITD: 71.04 ng/mL (ref 30.00–100.00)

## 2021-02-25 NOTE — Progress Notes (Addendum)
Patient ID: Allison [name], female   DOB: Aug 10, 1951, 70 y.o.   MRN: [id number]   Subjective:    Patient ID: Allison [name], female    DOB: [birth year], 70 y.o.   MRN: [id number]  HPI This visit occurred during the SARS-CoV-2 public health emergency.  Safety protocols were in place, including screening questions prior to the visit, additional usage of staff PPE, and extensive cleaning of exam room while observing appropriate contact time as indicated for disinfecting solutions.  Patient here for a scheduled follow up. Follow up regarding PAF on anticoagulation, GERD, hypertension and hypercholesterolemia.  ECHO 04/2019 - normal LV and RV function with no significant valve abnormality.  Evaluated in ER 12/13/20 - palpitations.  EKG - NSR with frequent runs of PACs.  CXR unremarkable.  Maintained on cardizem CD 147m and toprol 796mq day. She is doing well.  No recent increased heart rate or palpitations.  No chest pain or sob reported.  No acid reflux.  No abdominal pain.  Bowels moving.  Recent evaluation after noticing pain after visit with chiropractor. [facility]  Pain is better now.  Pelvic floor therapy - helping.  Discussed bone density and treatment options.    Past Medical History:  Diagnosis Date  [place]  . GERD (gastroesophageal reflux disease)   . Hypercholesterolemia   . Hypertension   . Migraine headache   . Psoriasis    Past Surgical History:  Procedure Laterality Date  . COLONOSCOPY WITH PROPOFOL N/A 12/23/2016   Procedure: COLONOSCOPY WITH PROPOFOL;  Surgeon: MaLollie SailsMD;  [facility];  Service: Endoscopy;  Laterality: N/A;  . DILATION AND CURETTAGE OF UTERUS     history of abnormal bleeding  . TUBAL LIGATION     Family History  Problem Relation Age of Onset  . Asthma Father   . Diabetes Mother   . CVA Mother   . Psoriasis Mother   . Breast cancer Maternal Grandmother   . Breast cancer  Paternal Grandmother   . Colon cancer Maternal Uncle   . Colon cancer Other        nephew  . Diabetes Other        multiple relatives (both sides)  . Stroke Brother        Light stroke  . Diabetes Brother    Social History   Socioeconomic History  . Marital status: Single    Spouse name: Not on file  . Number of children: 1  . Years of education: Not on file  . Highest education level: Not on file  Occupational History  . Not on file  Tobacco Use  . Smoking status: Never Smoker  . Smokeless tobacco: Never Used  Vaping Use  . Vaping Use: Never used  Substance and Sexual Activity  . Alcohol use: No    Alcohol/week: 0.0 standard drinks  . Drug use: No  . Sexual activity: Not on file  Other Topics Concern  . Not on file  Social History Narrative  . Not on file   Social Determinants of Health   Financial Resource Strain: Low Risk   . Difficulty of Paying Living Expenses: Not hard at all  Food Insecurity: No Food Insecurity  . Worried About RuCharity fundraisern the Last Year: Never true  . Ran Out of Food in the Last Year: Never true  Transportation Needs: No Transportation Needs  . Lack of Transportation (Medical): No  .  Lack of Transportation (Non-Medical): No  Physical Activity: Not on file  Stress: No Stress Concern Present  . Feeling of Stress : Not at all  Social Connections: Unknown  . Frequency of Communication with Friends and Family: More than three times a week  . Frequency of Social Gatherings with Friends and Family: More than three times a week  . Attends Religious Services: Not on file  . Active Member of Clubs or Organizations: Not on file  . Attends Archivist Meetings: Not on file  . Marital Status: Not on file    Outpatient Encounter Medications as of 02/25/2021  Medication Sig  . ALPRAZolam (XANAX) 0.25 MG tablet TAKE 1 TABLET BY MOUTH ONCE DAILY AS NEEDED FOR ANXIETY  . diltiazem (CARDIZEM CD) 120 MG 24 hr capsule Take 1 capsule by  mouth once daily  . diltiazem (CARDIZEM) 30 MG tablet Take 1 tablet (30 mg total) by mouth 3 (three) times daily as needed (As needed for tachycardia/atrial fib).  . hyoscyamine (LEVSIN, ANASPAZ) 0.125 MG tablet TAKE 1 TABLET BY MOUTH EVERY 6 HOURS AS NEEDED  . metoprolol succinate (TOPROL-XL) 50 MG 24 hr tablet TAKE ONE AND ONE-HALF TABLETS BY MOUTH ONCE DAILY WITH OR IMMEDIATELY FOLLOWING A MEAL  . omeprazole (PRILOSEC) 20 MG capsule Take 1 capsule by mouth once daily  . potassium chloride (KLOR-CON) 10 MEQ tablet Take 1 tablet by mouth once daily  . rosuvastatin (CRESTOR) 10 MG tablet Take 1 tablet by mouth once daily  . [DISCONTINUED] rivaroxaban (XARELTO) 20 MG TABS tablet Take 1 tablet (20 mg total) by mouth daily with supper.   No facility-administered encounter medications on file as of 02/25/2021.    Review of Systems  Constitutional: Negative for appetite change and unexpected weight change.  HENT: Negative for congestion.   Respiratory: Negative for cough, chest tightness and shortness of breath.   Cardiovascular: Negative for chest pain, palpitations and leg swelling.  Gastrointestinal: Negative for abdominal pain, diarrhea, nausea and vomiting.  Genitourinary: Negative for difficulty urinating and dysuria.  Musculoskeletal: Negative for joint swelling and myalgias.  Skin: Negative for color change and rash.  Neurological: Negative for dizziness, light-headedness and headaches.  Psychiatric/Behavioral: Negative for agitation and dysphoric mood.       Objective:    Physical Exam Vitals reviewed.  Constitutional:      General: She is not in acute distress.    Appearance: Normal appearance.  HENT:     Head: Normocephalic and atraumatic.     Right Ear: External ear normal.     Left Ear: External ear normal.  Eyes:     General: No scleral icterus.       Right eye: No discharge.        Left eye: No discharge.     Conjunctiva/sclera: Conjunctivae normal.  Neck:      Thyroid: No thyromegaly.  Cardiovascular:     Rate and Rhythm: Normal rate and regular rhythm.  Pulmonary:     Effort: No respiratory distress.     Breath sounds: Normal breath sounds. No wheezing.  Abdominal:     General: Bowel sounds are normal.     Palpations: Abdomen is soft.     Tenderness: There is no abdominal tenderness.  Musculoskeletal:        General: No swelling or tenderness.     Cervical back: Neck supple. No tenderness.  Lymphadenopathy:     Cervical: No cervical adenopathy.  Skin:    Findings: No erythema or rash.  Neurological:     Mental Status: She is alert.  Psychiatric:        Mood and Affect: Mood normal.        Behavior: Behavior normal.     BP 118/70   Pulse (!) 59   Temp 97.9 F (36.6 C)   Resp 16   Ht '5\' 8"'  (1.727 m)   Wt 158 lb (71.7 kg)   SpO2 98%   BMI 24.02 kg/m  Wt Readings from Last 3 Encounters:  02/25/21 158 lb (71.7 kg)  12/22/20 157 lb (71.2 kg)  12/13/20 155 lb (70.3 kg)     Lab Results  Component Value Date   WBC 6.8 12/13/2020   HGB 13.8 12/13/2020   HCT 41.2 12/13/2020   PLT 262 12/13/2020   GLUCOSE 103 (H) 01/27/2021   CHOL 142 01/27/2021   TRIG 63.0 01/27/2021   HDL 54.70 01/27/2021   LDLCALC 75 01/27/2021   ALT 11 01/27/2021   AST 18 01/27/2021   NA 140 01/27/2021   K 4.6 01/27/2021   CL 103 01/27/2021   CREATININE 0.80 01/27/2021   BUN 18 01/27/2021   CO2 32 01/27/2021   TSH 3.003 07/26/2020   INR 2.3 (H) 12/13/2020   HGBA1C 5.9 01/27/2021    DG Chest 2 View  Result Date: 12/13/2020 CLINICAL DATA:  Palpitations. EXAM: CHEST - 2 VIEW COMPARISON:  July 26, 2020 FINDINGS: The lungs are hyperinflated. Mild, diffuse chronic appearing increased lung markings are seen. There is no evidence of acute infiltrate, pleural effusion or pneumothorax. The heart size and mediastinal contours are within normal limits. The visualized skeletal structures are unremarkable. IMPRESSION: No active cardiopulmonary disease.  Electronically Signed   By: Virgina Norfolk M.D.   On: 12/13/2020 20:29       Assessment & Plan:   Problem List Items Addressed This Visit    GERD (gastroesophageal reflux disease)    Controlled on prilosec.       Hypercholesterolemia    Continue pravastatin.  Low cholesterol diet and exercise.  Follow lipid panel and liver function tests.        Hyperglycemia    Recent a1c 5.9.  Continue low carb diet and exercise.  Follow met b and a1c.       Hypertension    Continue metoprolol and cardizem. Blood pressure doing well.  Follow. Recent met b wnl.       Osteoporosis    Discussed bone density and treatment options, including oral bisphosphonates, reclast and prolia.  She is interested in reclast.  Referral to endocrinology.       Relevant Orders   Urinalysis, Routine w reflex microscopic (Completed)   VITAMIN D 25 Hydroxy (Vit-D Deficiency, Fractures) (Completed)   Ambulatory referral to Endocrinology   Paroxysmal atrial fibrillation ([place])    Continue xarelto.  Continue cardizem CD 138m q day and toprol 750mq day.  Appears to be doing well on these medications.  Has diltiazem 3016mrn to take if needed.       Rib fracture    [facility]  Better now.  Discussed bone density.            ChaEinar PheasantD

## 2021-02-25 NOTE — Assessment & Plan Note (Signed)
Continue metoprolol and cardizem. Blood pressure doing well.  Follow. Recent met b wnl.

## 2021-02-25 NOTE — Assessment & Plan Note (Signed)
Continue xarelto.  Continue cardizem CD 120mg q day and toprol 75mg q day.  Appears to be doing well on these medications.  Has diltiazem 30mg prn to take if needed.   

## 2021-02-25 NOTE — Assessment & Plan Note (Signed)
Continue pravastatin.  Low cholesterol diet and exercise.  Follow lipid panel and liver function tests.   

## 2021-02-25 NOTE — Assessment & Plan Note (Signed)
Recent a1c 5.9.  Continue low carb diet and exercise.  Follow met b and a1c.

## 2021-02-25 NOTE — Assessment & Plan Note (Signed)
Controlled on prilosec.   

## 2021-02-25 NOTE — Assessment & Plan Note (Signed)
Recently evaluated at [facility] and found to have left rib fracture.  Better now.  Discussed bone density.

## 2021-03-01 ENCOUNTER — Other Ambulatory Visit: Payer: Self-pay | Admitting: Cardiovascular Disease

## 2021-03-02 NOTE — Telephone Encounter (Signed)
45f, 71.7kg, scr 0.8 01/27/21, lovw/walker 12/22/20, ccr 76.2

## 2021-03-02 NOTE — Telephone Encounter (Signed)
Refill request

## 2021-03-05 NOTE — Progress Notes (Signed)
Cardiology Office Note  Date:  03/08/2021   ID:  Allison [name], DOB 14-May-1951, MRN [id number]  PCP:  Einar Pheasant, MD   Chief Complaint  Patient presents with  . 2 month follow up     Patient c/o chest discomfort at rest with stinging/burning sensation in right leg. Medications reviewed by the patient verbally.     HPI:  [name] is a 70 year old woman with past medical history of paroxysmal atrial fibrillation several episodes 01/2019 Essential hypertension Seen in the emergency room January 31, 2019 for Atrial fibrillation Who presents for follow-up of her paroxysmal atrial fibrillation  LOV 04/2020  ED 12/13/20 due to palpitations.  EKG showed NSR with frequent runs of PACs.   CXR unremarkable.  Suggested she take diltiazem for rate control.  Rare palpitations Takes xanax and diltiazem  Active at work, lots of steps Works in Teacher, early years/pre Not much stress at work  No atrial fib spells in 2 years  Echocardiogram done July 2020, normal LV function, RV function, discussed in detail  EKG personally reviewed by myself on todays visit Shows sinus bradycardia rate 60 bpm no significant ST-T wave changes  CT of ABD 2017, Minimal descending aorta atherosclerosis, was described as moderate but there is minimal plaque  No past medical history reviewed Mother with a CVA, age 65 Brother with TIA/CVA Strong family hx of atrial fib  Atrial fib 01/31/2019, 11 Am developed tachycardia Went to the emergency room, EKG showing atrial fibrillation 190 bpm  given diltiazem IV bolus repeat dosing, improved rate down to 130 bpm Discharged on Cardizem CD 120 mg daily  potassium was low at 3.2, on HCTZ   PMH:   has a past medical history of A-fib ([place]), GERD (gastroesophageal reflux disease), Hypercholesterolemia, Hypertension, Migraine headache, and Psoriasis.  PSH:    Past Surgical History:  Procedure Laterality Date  . COLONOSCOPY WITH PROPOFOL N/A 12/23/2016    Procedure: COLONOSCOPY WITH PROPOFOL;  Surgeon: Lollie Sails, MD;  Location: [facility] ENDOSCOPY;  Service: Endoscopy;  Laterality: N/A;  . DILATION AND CURETTAGE OF UTERUS     history of abnormal bleeding  . TUBAL LIGATION      Current Outpatient Medications  Medication Sig Dispense Refill  . ALPRAZolam (XANAX) 0.25 MG tablet TAKE 1 TABLET BY MOUTH ONCE DAILY AS NEEDED FOR ANXIETY 30 tablet 0  . diltiazem (CARDIZEM CD) 120 MG 24 hr capsule Take 1 capsule by mouth once daily 90 capsule 2  . diltiazem (CARDIZEM) 30 MG tablet Take 1 tablet (30 mg total) by mouth 3 (three) times daily as needed (As needed for tachycardia/atrial fib). 90 tablet 3  . hyoscyamine (LEVSIN, ANASPAZ) 0.125 MG tablet TAKE 1 TABLET BY MOUTH EVERY 6 HOURS AS NEEDED 30 tablet 0  . metoprolol succinate (TOPROL-XL) 50 MG 24 hr tablet TAKE ONE AND ONE-HALF TABLETS BY MOUTH ONCE DAILY WITH OR IMMEDIATELY FOLLOWING A MEAL 135 tablet 3  . omeprazole (PRILOSEC) 20 MG capsule Take 1 capsule by mouth once daily 90 capsule 0  . potassium chloride (KLOR-CON) 10 MEQ tablet Take 1 tablet by mouth once daily 90 tablet 2  . rosuvastatin (CRESTOR) 10 MG tablet Take 1 tablet by mouth once daily 90 tablet 0  . XARELTO 20 MG TABS tablet TAKE 1 TABLET BY MOUTH ONCE DAILY WITH  SUPPER 90 tablet 1   No current facility-administered medications for this visit.     Allergies:   Iodine   Social History:  The patient  reports  that she has never smoked. She has never used smokeless tobacco. She reports that she does not drink alcohol and does not use drugs.   Family History:   family history includes Asthma in her father; Breast cancer in her maternal grandmother and paternal grandmother; CVA in her mother; Colon cancer in her maternal uncle and another family member; Diabetes in her brother, mother, and another family member; Psoriasis in her mother; Stroke in her brother.    Review of Systems: Review of Systems  Constitutional: Negative.    HENT: Negative.   Respiratory: Negative.   Cardiovascular: Negative.   Gastrointestinal: Negative.   Musculoskeletal: Negative.   Neurological: Negative.   Psychiatric/Behavioral: Negative.   All other systems reviewed and are negative.   PHYSICAL EXAM: VS:  BP 110/78 (BP Location: Left Arm, Patient Position: Sitting, Cuff Size: Normal)   Pulse 60   Ht 5' 7.5" (1.715 m)   Wt 160 lb 8 oz (72.8 kg)   SpO2 98%   BMI 24.77 kg/m  , BMI Body mass index is 24.77 kg/m.  Constitutional:  oriented to person, place, and time. No distress.  HENT:  Head: Grossly normal Eyes:  no discharge. No scleral icterus.  Neck: No JVD, no carotid bruits  Cardiovascular: Regular rate and rhythm, no murmurs appreciated Pulmonary/Chest: Clear to auscultation bilaterally, no wheezes or rails Abdominal: Soft.  no distension.  no tenderness.  Musculoskeletal: Normal range of motion Neurological:  normal muscle tone. Coordination normal. No atrophy Skin: Skin warm and dry Psychiatric: normal affect, pleasant   Recent Labs: 07/26/2020: TSH 3.003 12/13/2020: Hemoglobin 13.8; Platelets 262 01/27/2021: ALT 11; BUN 18; Creatinine, Ser 0.80; Potassium 4.6; Sodium 140    Lipid Panel Lab Results  Component Value Date   CHOL 142 01/27/2021   HDL 54.70 01/27/2021   LDLCALC 75 01/27/2021   TRIG 63.0 01/27/2021      Wt Readings from Last 3 Encounters:  03/08/21 160 lb 8 oz (72.8 kg)  02/25/21 158 lb (71.7 kg)  12/22/20 157 lb (71.2 kg)      ASSESSMENT AND PLAN:  Problem List Items Addressed This Visit      Cardiology Problems   Paroxysmal atrial fibrillation ([place]) - Primary    Other Visit Diagnoses    PAC (premature atrial contraction)       Essential hypertension       Mixed hyperlipidemia       PVC (premature ventricular contraction)         Paroxysmal atrial fibrillation (H[facility]) -   Continue diltiazem CD 120 mg daily and metoprolol succinate 75 daily CHADS VASC 2, continue xarelto 20 mg  daily Diltiazem for breakthrough  Essential hypertension Blood pressure is well controlled on today's visit. No changes made to the medications.  Hypercholesterolemia crestor , total chol 142  Chest pain Prior atypical sx No further workup  Aortic atherosclerosis  on Crestor, Mild disease noted    Total encounter time more than 25 minutes  Greater than 50% was spent in counseling and coordination of care with the patient    Signed, Esmond Plants, M.D., Ph.D. [place], [place]

## 2021-03-06 ENCOUNTER — Encounter: Payer: Self-pay | Admitting: Internal Medicine

## 2021-03-07 NOTE — Assessment & Plan Note (Signed)
Discussed bone density and treatment options, including oral bisphosphonates, reclast and prolia.  She is interested in reclast.  Referral to endocrinology.

## 2021-03-07 NOTE — Addendum Note (Signed)
Addended by: Alisa Graff on: 03/07/2021 12:03 AM   Modules accepted: Orders

## 2021-03-08 ENCOUNTER — Ambulatory Visit: Payer: Medicare Other | Admitting: Cardiovascular Disease

## 2021-03-08 ENCOUNTER — Encounter: Payer: Self-pay | Admitting: Cardiovascular Disease

## 2021-03-08 ENCOUNTER — Other Ambulatory Visit: Payer: Self-pay

## 2021-03-08 VITALS — BP 110/78 | HR 60 | Ht 67.5 in | Wt 160.5 lb

## 2021-03-08 DIAGNOSIS — I10 Essential (primary) hypertension: Secondary | ICD-10-CM

## 2021-03-08 DIAGNOSIS — I480 Paroxysmal atrial fibrillation: Secondary | ICD-10-CM | POA: Diagnosis not present

## 2021-03-08 DIAGNOSIS — I491 Atrial premature depolarization: Secondary | ICD-10-CM | POA: Diagnosis not present

## 2021-03-08 DIAGNOSIS — E782 Mixed hyperlipidemia: Secondary | ICD-10-CM | POA: Diagnosis not present

## 2021-03-08 DIAGNOSIS — I493 Ventricular premature depolarization: Secondary | ICD-10-CM | POA: Diagnosis not present

## 2021-03-08 NOTE — Patient Instructions (Signed)
Medication Instructions:  No changes  If you need a refill on your cardiac medications before your next appointment, please call your pharmacy.    Lab work: No new labs needed   If you have labs (blood work) drawn today and your tests are completely normal, you will receive your results only by: . MyChart Message (if you have MyChart) OR . A paper copy in the mail If you have any lab test that is abnormal or we need to change your treatment, we will call you to review the results.   Testing/Procedures: No new testing needed   Follow-Up: At [facility], you and your health needs are our priority.  As part of our continuing mission to provide you with exceptional heart care, we have created designated Provider Care Teams.  These Care Teams include your primary Cardiologist (physician) and Advanced Practice Providers (APPs -  Physician Assistants and Nurse Practitioners) who all work together to provide you with the care you need, when you need it.  . You will need a follow up appointment in 12 months  . Providers on your designated Care Team:   . Christopher Berge, NP . Ryan Dunn, PA-C . Jacquelyn Visser, PA-C  Any Other Special Instructions Will Be Listed Below (If Applicable).  COVID-19 Vaccine Information can be found at: [email or web address] For questions related to vaccine distribution or appointments, please email [email or web address] or call [phone].     

## 2021-03-09 ENCOUNTER — Telehealth: Payer: Self-pay | Admitting: Internal Medicine

## 2021-03-09 NOTE — Telephone Encounter (Signed)
Patient called in about referral for osteoporosis [facility] clinic never heard anything from them

## 2021-03-10 NOTE — Telephone Encounter (Signed)
Patient aware that referral is being processed.

## 2021-04-27 ENCOUNTER — Other Ambulatory Visit: Payer: Self-pay | Admitting: Internal Medicine

## 2021-05-11 ENCOUNTER — Other Ambulatory Visit: Payer: Self-pay | Admitting: Internal Medicine

## 2021-05-18 DIAGNOSIS — M810 Age-related osteoporosis without current pathological fracture: Secondary | ICD-10-CM | POA: Diagnosis not present

## 2021-06-19 ENCOUNTER — Other Ambulatory Visit: Payer: Self-pay | Admitting: Cardiovascular Disease

## 2021-06-23 ENCOUNTER — Other Ambulatory Visit: Payer: Self-pay | Admitting: Cardiovascular Disease

## 2021-06-29 ENCOUNTER — Ambulatory Visit: Payer: Medicare Other

## 2021-07-23 ENCOUNTER — Other Ambulatory Visit: Payer: Self-pay | Admitting: Internal Medicine

## 2021-07-23 ENCOUNTER — Other Ambulatory Visit: Payer: Self-pay | Admitting: Cardiovascular Disease

## 2021-07-26 NOTE — Telephone Encounter (Signed)
RX Refill:xanax and levsin Last Seen:02-25-21 Last ordered:04-29-21 and 12-31-2018

## 2021-08-02 ENCOUNTER — Other Ambulatory Visit: Payer: Self-pay | Admitting: Internal Medicine

## 2021-08-10 ENCOUNTER — Other Ambulatory Visit: Payer: Self-pay | Admitting: Family

## 2021-08-11 NOTE — Telephone Encounter (Signed)
Prescription refill request for Xarelto received.  Indication:afib Last office visit:gollan 03/08/21 Weight:72.8kg Age:77f Scr:0.8 01/27/21 CrCl:77.3

## 2021-08-24 ENCOUNTER — Ambulatory Visit: Payer: Medicare Other

## 2021-10-05 ENCOUNTER — Ambulatory Visit (INDEPENDENT_AMBULATORY_CARE_PROVIDER_SITE_OTHER): Payer: Medicare Other | Admitting: Internal Medicine

## 2021-10-05 ENCOUNTER — Ambulatory Visit (INDEPENDENT_AMBULATORY_CARE_PROVIDER_SITE_OTHER): Payer: Medicare Other

## 2021-10-05 ENCOUNTER — Other Ambulatory Visit: Payer: Self-pay

## 2021-10-05 VITALS — BP 126/70 | HR 63 | Temp 97.6°F | Resp 16 | Ht 68.0 in | Wt 165.6 lb

## 2021-10-05 DIAGNOSIS — R0781 Pleurodynia: Secondary | ICD-10-CM

## 2021-10-05 DIAGNOSIS — R0789 Other chest pain: Secondary | ICD-10-CM

## 2021-10-05 DIAGNOSIS — Z1231 Encounter for screening mammogram for malignant neoplasm of breast: Secondary | ICD-10-CM | POA: Diagnosis not present

## 2021-10-05 DIAGNOSIS — M810 Age-related osteoporosis without current pathological fracture: Secondary | ICD-10-CM | POA: Diagnosis not present

## 2021-10-05 DIAGNOSIS — I480 Paroxysmal atrial fibrillation: Secondary | ICD-10-CM

## 2021-10-05 DIAGNOSIS — S2232XD Fracture of one rib, left side, subsequent encounter for fracture with routine healing: Secondary | ICD-10-CM

## 2021-10-05 DIAGNOSIS — E7800 Pure hypercholesterolemia, unspecified: Secondary | ICD-10-CM | POA: Diagnosis not present

## 2021-10-05 DIAGNOSIS — R739 Hyperglycemia, unspecified: Secondary | ICD-10-CM

## 2021-10-05 DIAGNOSIS — I10 Essential (primary) hypertension: Secondary | ICD-10-CM

## 2021-10-05 DIAGNOSIS — Z23 Encounter for immunization: Secondary | ICD-10-CM | POA: Diagnosis not present

## 2021-10-05 DIAGNOSIS — K219 Gastro-esophageal reflux disease without esophagitis: Secondary | ICD-10-CM

## 2021-10-05 MED ORDER — ALENDRONATE SODIUM 70 MG PO TABS: 70.0000 mg | ORAL_TABLET | ORAL | 11 refills | Status: DC

## 2021-10-05 NOTE — Progress Notes (Signed)
Patient ID: Allison [name], female   DOB: 01/12/1951, 70 y.o.   MRN: [id number]   Subjective:    Patient ID: Allison [name], female    DOB: 16-Jan-1951, 18 y.o.   MRN: [id number]  This visit occurred during the SARS-CoV-2 public health emergency.  Safety protocols were in place, including screening questions prior to the visit, additional usage of staff PPE, and extensive cleaning of exam room while observing appropriate contact time as indicated for disinfecting solutions.   Patient here for a scheduled follow up.    HPI Here to follow up regarding her cholesterol, blood pressure and atrial fib. Sees Dr Mariah Milling.  On diltiazem and metoprolol daily.  Also remains on xarelto.  Overall is doing relatively well.  No significant increased heart rate or palpitations.  If notices, states occurs more - after eating evening meal.  No chest pain or sob with increased activity or exertion.  No abdominal pain.  No problems swallowing.  Bowels stable.  Persistent pain - left rib.  Was previously evaluated at [facility].  No pain with deep breathing.    Past Medical History:  Diagnosis Date   A-fib [facility])    GERD (gastroesophageal reflux disease)    Hypercholesterolemia    Hypertension    Migraine headache    Psoriasis    Past Surgical History:  Procedure Laterality Date   COLONOSCOPY WITH PROPOFOL N/A 12/23/2016   Procedure: COLONOSCOPY WITH PROPOFOL;  Surgeon: Christena Deem, MD;  Location: [facility] ENDOSCOPY;  Service: Endoscopy;  Laterality: N/A;   DILATION AND CURETTAGE OF UTERUS     history of abnormal bleeding   TUBAL LIGATION     Family History  Problem Relation Age of Onset   Asthma Father    Diabetes Mother    CVA Mother    Psoriasis Mother    Breast cancer Maternal Grandmother    Breast cancer Paternal Grandmother    Colon cancer Maternal Uncle    Colon cancer Other        nephew   Diabetes Other        multiple relatives (both sides)   Stroke Brother        Light stroke    Diabetes Brother    Social History   Socioeconomic History   Marital status: Single    Spouse name: Not on file   Number of children: 1   Years of education: Not on file   Highest education level: Not on file  Occupational History   Not on file  Tobacco Use   Smoking status: Never   Smokeless tobacco: Never  Vaping Use   Vaping Use: Never used  Substance and Sexual Activity   Alcohol use: No    Alcohol/week: 0.0 standard drinks   Drug use: No   Sexual activity: Not on file  Other Topics Concern   Not on file  Social History Narrative   Not on file   Social Determinants of Health   Financial Resource Strain: Not on file  Food Insecurity: Not on file  Transportation Needs: Not on file  Physical Activity: Not on file  Stress: Not on file  Social Connections: Not on file     Review of Systems  Constitutional:  Negative for appetite change and unexpected weight change.  HENT:  Negative for congestion and sinus pressure.   Respiratory:  Negative for cough, chest tightness and shortness of breath.   Cardiovascular:  Negative for chest pain, palpitations and leg swelling.  Gastrointestinal:  Negative for abdominal pain, diarrhea, nausea and vomiting.  Genitourinary:  Negative for difficulty urinating and dysuria.  Musculoskeletal:  Negative for joint swelling and myalgias.       Left anterior rib pain.   Skin:  Negative for color change and rash.  Neurological:  Negative for dizziness, light-headedness and headaches.  Psychiatric/Behavioral:  Negative for agitation and dysphoric mood.       Objective:     BP 126/70    Pulse 63    Temp 97.6 F (36.4 C)    Resp 16    Ht $R'5\' 8"'gw$  (1.727 m)    Wt 165 lb 9.6 oz (75.1 kg)    SpO2 98%    BMI 25.18 kg/m  Wt Readings from Last 3 Encounters:  10/05/21 165 lb 9.6 oz (75.1 kg)  03/08/21 160 lb 8 oz (72.8 kg)  02/25/21 158 lb (71.7 kg)    Physical Exam Vitals reviewed.  Constitutional:      General: She is not in acute  distress.    Appearance: Normal appearance.  HENT:     Head: Normocephalic and atraumatic.     Right Ear: External ear normal.     Left Ear: External ear normal.  Eyes:     General: No scleral icterus.       Right eye: No discharge.        Left eye: No discharge.     Conjunctiva/sclera: Conjunctivae normal.  Neck:     Thyroid: No thyromegaly.  Cardiovascular:     Rate and Rhythm: Normal rate and regular rhythm.  Pulmonary:     Effort: No respiratory distress.     Breath sounds: Normal breath sounds. No wheezing.  Abdominal:     General: Bowel sounds are normal.     Palpations: Abdomen is soft.     Tenderness: There is no abdominal tenderness.  Musculoskeletal:        General: No swelling.     Cervical back: Neck supple. No tenderness.     Comments: Increased tenderness to palpation - left anterior rib.    Lymphadenopathy:     Cervical: No cervical adenopathy.  Skin:    Findings: No erythema or rash.  Neurological:     Mental Status: She is alert.  Psychiatric:        Mood and Affect: Mood normal.        Behavior: Behavior normal.     Outpatient Encounter Medications as of 10/05/2021  Medication Sig   alendronate (FOSAMAX) 70 MG tablet Take 1 tablet (70 mg total) by mouth every 7 (seven) days. Take with a full glass of water on an empty stomach.   ALPRAZolam (XANAX) 0.25 MG tablet TAKE 1 TABLET BY MOUTH ONCE DAILY AS NEEDED FOR ANXIETY   diltiazem (CARDIZEM CD) 120 MG 24 hr capsule Take 1 capsule by mouth once daily   diltiazem (CARDIZEM) 30 MG tablet TAKE 1 TABLET BY MOUTH THREE TIMES DAILY AS NEEDED FOR  TACHYCARDIA/  ATRIAL  FIB   hyoscyamine (LEVSIN) 0.125 MG tablet TAKE 1 TABLET BY MOUTH EVERY 6 HOURS AS NEEDED   metoprolol succinate (TOPROL-XL) 50 MG 24 hr tablet TAKE ONE AND ONE-HALF TABLETS BY MOUTH ONCE DAILY WITH OR IMMEDIATELY FOLLOWING A MEAL   omeprazole (PRILOSEC) 20 MG capsule Take 1 capsule by mouth once daily   potassium chloride (KLOR-CON) 10 MEQ tablet  Take 1 tablet by mouth once daily   rivaroxaban (XARELTO) 20 MG TABS tablet TAKE 1 TABLET BY MOUTH ONCE  DAILY WITH SUPPER   rosuvastatin (CRESTOR) 10 MG tablet Take 1 tablet by mouth once daily   No facility-administered encounter medications on file as of 10/05/2021.     Lab Results  Component Value Date   WBC 6.8 12/13/2020   HGB 13.8 12/13/2020   HCT 41.2 12/13/2020   PLT 262 12/13/2020   GLUCOSE 95 10/05/2021   CHOL 155 10/05/2021   TRIG 86.0 10/05/2021   HDL 60.70 10/05/2021   LDLCALC 77 10/05/2021   ALT 15 10/05/2021   AST 19 10/05/2021   NA 141 10/05/2021   K 4.5 10/05/2021   CL 103 10/05/2021   CREATININE 0.83 10/05/2021   BUN 19 10/05/2021   CO2 31 10/05/2021   TSH 0.98 10/05/2021   INR 2.3 (H) 12/13/2020   HGBA1C 5.9 10/05/2021    DG Chest 2 View  Result Date: 12/13/2020 CLINICAL DATA:  Palpitations. EXAM: CHEST - 2 VIEW COMPARISON:  July 26, 2020 FINDINGS: The lungs are hyperinflated. Mild, diffuse chronic appearing increased lung markings are seen. There is no evidence of acute infiltrate, pleural effusion or pneumothorax. The heart size and mediastinal contours are within normal limits. The visualized skeletal structures are unremarkable. IMPRESSION: No active cardiopulmonary disease. Electronically Signed   By: Virgina Norfolk M.D.   On: 12/13/2020 20:29       Assessment & Plan:   Problem List Items Addressed This Visit     GERD (gastroesophageal reflux disease)    Controlled on prilosec.       Hypercholesterolemia    Continue pravastatin.  Low cholesterol diet and exercise.  Follow lipid panel and liver function tests.        Relevant Orders   TSH (Completed)   Lipid panel (Completed)   Hepatic function panel (Completed)   Hyperglycemia    Continue low carb diet and exercise.  Follow met b and a1c.       Relevant Orders   Hemoglobin A1c (Completed)   Hypertension    Continue metoprolol and cardizem. Blood pressure doing well.  Follow  pressures.  Follow met b.       Relevant Orders   Basic metabolic panel (Completed)   Osteoporosis    Have discussed bone density and treatment options, including oral bisphosphonates, reclast and prolia.  She saw endocrinology.  Desires oral bisphosphonate.  Start fosamax.  Discussed proper way to take the medication.  Discussed possible side effects of medication.        Relevant Medications   alendronate (FOSAMAX) 70 MG tablet   Paroxysmal atrial fibrillation (HCC)    Continue xarelto.  Continue cardizem CD $RemoveBef'120mg'sBpZNBsuWe$  q day and toprol $RemoveBe'75mg'XPXGVmpZf$  q day.  Appears to be doing well on these medications.  Has diltiazem $RemoveBefor'30mg'KlUnqkbwbrON$  prn to take if needed.       Rib fracture    Reports previous rib fracture with xray through [facility].  Need xray report.  Given persistent pain,will xray today.        Rib pain on left side    See above.  Will recheck xray.        Relevant Orders   DG Ribs Unilateral Left (Completed)   Other Visit Diagnoses     Visit for screening mammogram    -  Primary   Relevant Orders   MM 3D SCREEN BREAST BILATERAL   Need for immunization against influenza       Relevant Orders   Flu Vaccine QUAD High Dose(Fluad) (Completed)  Einar Pheasant, MD

## 2021-10-06 LAB — HEPATIC FUNCTION PANEL
ALT: 15 U/L (ref 0–35)
AST: 19 U/L (ref 0–37)
Albumin: 4.2 g/dL (ref 3.5–5.2)
Alkaline Phosphatase: 66 U/L (ref 39–117)
Bilirubin, Direct: 0 mg/dL (ref 0.0–0.3)
Total Bilirubin: 0.3 mg/dL (ref 0.2–1.2)
Total Protein: 6.8 g/dL (ref 6.0–8.3)

## 2021-10-06 LAB — BASIC METABOLIC PANEL
BUN: 19 mg/dL (ref 6–23)
CO2: 31 mEq/L (ref 19–32)
Calcium: 9.6 mg/dL (ref 8.4–10.5)
Chloride: 103 mEq/L (ref 96–112)
Creatinine, Ser: 0.83 mg/dL (ref 0.40–1.20)
GFR: 72.09 mL/min (ref >60.00)
Glucose, Bld: 95 mg/dL (ref 70–99)
Potassium: 4.5 mEq/L (ref 3.5–5.1)
Sodium: 141 mEq/L (ref 135–145)

## 2021-10-06 LAB — HEMOGLOBIN A1C: Hgb A1c MFr Bld: 5.9 % (ref 4.6–6.5)

## 2021-10-06 LAB — LIPID PANEL
Cholesterol: 155 mg/dL (ref 0–200)
HDL: 60.7 mg/dL (ref >39.00)
LDL Cholesterol: 77 mg/dL (ref 0–99)
NonHDL: 94.26
Total CHOL/HDL Ratio: 3
Triglycerides: 86 mg/dL (ref 0.0–149.0)
VLDL: 17.2 mg/dL (ref 0.0–40.0)

## 2021-10-06 LAB — TSH: TSH: 0.98 u[IU]/mL (ref 0.35–5.50)

## 2021-10-10 ENCOUNTER — Encounter: Payer: Self-pay | Admitting: Internal Medicine

## 2021-10-10 NOTE — Assessment & Plan Note (Addendum)
Have discussed bone density and treatment options, including oral bisphosphonates, reclast and prolia.  She saw endocrinology.  Desires oral bisphosphonate.  Start fosamax.  Discussed proper way to take the medication.  Discussed possible side effects of medication.

## 2021-10-10 NOTE — Assessment & Plan Note (Signed)
Continue low carb diet and exercise.  Follow met b and a1c.  

## 2021-10-10 NOTE — Assessment & Plan Note (Signed)
Continue metoprolol and cardizem. Blood pressure doing well.  Follow pressures.  Follow met b.  

## 2021-10-10 NOTE — Assessment & Plan Note (Signed)
Controlled on prilosec.   

## 2021-10-10 NOTE — Assessment & Plan Note (Signed)
Continue xarelto.  Continue cardizem CD 120mg q day and toprol 75mg q day.  Appears to be doing well on these medications.  Has diltiazem 30mg prn to take if needed.   

## 2021-10-10 NOTE — Assessment & Plan Note (Signed)
See above.  Will recheck xray.

## 2021-10-10 NOTE — Assessment & Plan Note (Signed)
Continue pravastatin.  Low cholesterol diet and exercise.  Follow lipid panel and liver function tests.   

## 2021-10-10 NOTE — Assessment & Plan Note (Signed)
Reports previous rib fracture with xray through [facility].  Need xray report.  Given persistent pain,will xray today.

## 2021-10-12 ENCOUNTER — Telehealth: Payer: Self-pay | Admitting: Internal Medicine

## 2021-10-12 NOTE — Telephone Encounter (Signed)
Patient returning call. Please call patient back @ [phone]

## 2021-10-12 NOTE — Telephone Encounter (Signed)
Copied from [place] [id number]. Topic: Medicare AWV >> Oct 12, 2021  3:41 PM Harris-Coley, Hannah Beat wrote: Reason for CRM: LVM 10/12/21 at 3:40pm to r/s AWV appt on 12/23 khc.  Please call Juliann Pulse [phone] to reschedule appt

## 2021-10-15 ENCOUNTER — Ambulatory Visit: Payer: Medicare Other

## 2021-10-22 ENCOUNTER — Telehealth: Payer: Self-pay | Admitting: Internal Medicine

## 2021-10-22 NOTE — Telephone Encounter (Signed)
Pt called in stating that she would like to get her pneumonia shot. I informed pt that the provider needs to put orders in first. Pt verbalized understanding and pt said she would like an early appt like 830 since we do not do them until then

## 2021-10-22 NOTE — Telephone Encounter (Signed)
Patient has had both pneumonia shots. Does not need another

## 2021-10-29 ENCOUNTER — Other Ambulatory Visit: Payer: Self-pay | Admitting: Internal Medicine

## 2021-11-01 ENCOUNTER — Other Ambulatory Visit: Payer: Self-pay | Admitting: Internal Medicine

## 2021-11-05 ENCOUNTER — Other Ambulatory Visit: Payer: Self-pay | Admitting: Internal Medicine

## 2021-11-23 DIAGNOSIS — Z86018 Personal history of other benign neoplasm: Secondary | ICD-10-CM | POA: Diagnosis not present

## 2021-11-23 DIAGNOSIS — D485 Neoplasm of uncertain behavior of skin: Secondary | ICD-10-CM | POA: Diagnosis not present

## 2021-11-23 DIAGNOSIS — L821 Other seborrheic keratosis: Secondary | ICD-10-CM | POA: Diagnosis not present

## 2021-11-23 DIAGNOSIS — D225 Melanocytic nevi of trunk: Secondary | ICD-10-CM | POA: Diagnosis not present

## 2021-11-23 DIAGNOSIS — L578 Other skin changes due to chronic exposure to nonionizing radiation: Secondary | ICD-10-CM | POA: Diagnosis not present

## 2021-11-23 DIAGNOSIS — Z872 Personal history of diseases of the skin and subcutaneous tissue: Secondary | ICD-10-CM | POA: Diagnosis not present

## 2021-11-23 DIAGNOSIS — D2261 Melanocytic nevi of right upper limb, including shoulder: Secondary | ICD-10-CM | POA: Diagnosis not present

## 2021-12-27 DIAGNOSIS — L814 Other melanin hyperpigmentation: Secondary | ICD-10-CM | POA: Diagnosis not present

## 2021-12-27 DIAGNOSIS — L570 Actinic keratosis: Secondary | ICD-10-CM | POA: Diagnosis not present

## 2021-12-27 DIAGNOSIS — D2361 Other benign neoplasm of skin of right upper limb, including shoulder: Secondary | ICD-10-CM | POA: Diagnosis not present

## 2022-02-10 ENCOUNTER — Ambulatory Visit (INDEPENDENT_AMBULATORY_CARE_PROVIDER_SITE_OTHER): Payer: Medicare Other

## 2022-02-10 VITALS — Ht 68.0 in | Wt 165.0 lb

## 2022-02-10 DIAGNOSIS — Z0000 Encounter for general adult medical examination without abnormal findings: Secondary | ICD-10-CM | POA: Diagnosis not present

## 2022-02-10 NOTE — Patient Instructions (Addendum)
?  Ms. Crumby , ?Thank you for taking time to come for your Medicare Wellness Visit. I appreciate your ongoing commitment to your health goals. Please review the following plan we discussed and let me know if I can assist you in the future.  ? ?These are the goals we discussed: ? Goals   ? ?  ? Patient Stated  ?   Increase physical activity (pt-stated)   ?   I would like to walk more for exercise. ?  ? ?  ?  ?This is a list of the screening recommended for you and due dates:  ?Health Maintenance  ?Topic Date Due  ? Mammogram  08/04/2021  ? COVID-19 Vaccine (4 - Booster for Pfizer series) 02/26/2022*  ? Colon Cancer Screening  03/22/2022*  ? Hepatitis C Screening: USPSTF Recommendation to screen - Ages 18-79 yo.  03/22/2022*  ? Zoster (Shingles) Vaccine (1 of 2) 05/12/2022*  ? Flu Shot  05/24/2022  ? Tetanus Vaccine  09/26/2026  ? Pneumonia Vaccine  Completed  ? DEXA scan (bone density measurement)  Completed  ? HPV Vaccine  Aged Out  ?*Topic was postponed. The date shown is not the original due date.  ?  ?

## 2022-02-10 NOTE — Progress Notes (Signed)
Subjective:   Allison [name] is a 71 y.o. female who presents for Medicare Annual (Subsequent) preventive examination.  Review of Systems    No ROS.  Medicare Wellness Virtual Visit.  Visual/audio telehealth visit, UTA vital signs.   See social history for additional risk factors.   Cardiac Risk Factors include: advanced age (>12men, >86 women);hypertension     Objective:    Today's Vitals   02/10/22 1138  Weight: 165 lb (74.8 kg)  Height: 5\' 8"  (1.727 m)   Body mass index is 25.09 kg/m.     02/10/2022   11:43 AM 12/13/2020    8:03 PM 07/26/2020    2:26 AM 06/25/2020   12:43 PM 10/15/2019    5:39 AM 02/16/2019    7:56 PM 12/23/2016   12:39 PM  Advanced Directives  Does Patient Have a Medical Advance Directive? Yes No;Yes Yes Yes Yes No No  Type of Social research officer, government Power of [employer] Power of [place];Living will Healthcare Power of Attorney    Does patient want to make changes to medical advance directive? No - Patient declined  No - Patient declined No - Patient declined No - Patient declined    Copy of Healthcare Power of Attorney in Chart? No - copy requested No - copy requested No - copy requested Yes - validated most recent copy scanned in chart (See row information) No - copy requested    Would patient like information on creating a medical advance directive?       No - Patient declined    Current Medications (verified) Outpatient Encounter Medications as of 02/10/2022  Medication Sig   alendronate (FOSAMAX) 70 MG tablet Take 1 tablet (70 mg total) by mouth every 7 (seven) days. Take with a full glass of water on an empty stomach.   ALPRAZolam (XANAX) 0.25 MG tablet TAKE 1 TABLET BY MOUTH ONCE DAILY AS NEEDED FOR ANXIETY   diltiazem (CARDIZEM CD) 120 MG 24 hr capsule Take 1 capsule by mouth once daily   diltiazem (CARDIZEM) 30 MG tablet TAKE 1 TABLET BY MOUTH THREE TIMES DAILY AS NEEDED  FOR  TACHYCARDIA/  ATRIAL  FIB   hyoscyamine (LEVSIN) 0.125 MG tablet TAKE 1 TABLET BY MOUTH EVERY 6 HOURS AS NEEDED   metoprolol succinate (TOPROL-XL) 50 MG 24 hr tablet TAKE ONE AND ONE-HALF TABLETS BY MOUTH ONCE DAILY WITH OR IMMEDIATELY FOLLOWING A MEAL   omeprazole (PRILOSEC) 20 MG capsule Take 1 capsule by mouth once daily   potassium chloride (KLOR-CON) 10 MEQ tablet Take 1 tablet by mouth once daily   rivaroxaban (XARELTO) 20 MG TABS tablet TAKE 1 TABLET BY MOUTH ONCE DAILY WITH SUPPER   rosuvastatin (CRESTOR) 10 MG tablet Take 1 tablet by mouth once daily   No facility-administered encounter medications on file as of 02/10/2022.    Allergies (verified) Iodine   History: Past Medical History:  Diagnosis Date   A-fib (HCC)    GERD (gastroesophageal reflux disease)    Hypercholesterolemia    Hypertension    Migraine headache    Psoriasis    Past Surgical History:  Procedure Laterality Date   COLONOSCOPY WITH PROPOFOL N/A 12/23/2016   Procedure: COLONOSCOPY WITH PROPOFOL;  Surgeon: Christena Deem, MD;  Location: [facility] ENDOSCOPY;  Service: Endoscopy;  Laterality: N/A;   DILATION AND CURETTAGE OF UTERUS     history of abnormal bleeding   TUBAL LIGATION     Family History  Problem Relation Age of Onset   Asthma Father    Diabetes Mother    CVA Mother    Psoriasis Mother    Breast cancer Maternal Grandmother    Breast cancer Paternal Grandmother    Colon cancer Maternal Uncle    Colon cancer Other        nephew   Diabetes Other        multiple relatives (both sides)   Stroke Brother        Light stroke   Diabetes Brother    Social History   Socioeconomic History   Marital status: Single    Spouse name: Not on file   Number of children: 1   Years of education: Not on file   Highest education level: Not on file  Occupational History   Not on file  Tobacco Use   Smoking status: Never   Smokeless tobacco: Never  Vaping Use   Vaping Use: Never used   Substance and Sexual Activity   Alcohol use: No    Alcohol/week: 0.0 standard drinks   Drug use: No   Sexual activity: Not on file  Other Topics Concern   Not on file  Social History Narrative   Not on file   Social Determinants of Health   Financial Resource Strain: Low Risk    Difficulty of Paying Living Expenses: Not hard at all  Food Insecurity: No Food Insecurity   Worried About Programme researcher, broadcasting/film/video in the Last Year: Never true   Ran Out of Food in the Last Year: Never true  Transportation Needs: No Transportation Needs   Lack of Transportation (Medical): No   Lack of Transportation (Non-Medical): No  Physical Activity: Sufficiently Active   Days of Exercise per Week: 5 days   Minutes of Exercise per Session: 30 min  Stress: No Stress Concern Present   Feeling of Stress : Not at all  Social Connections: Unknown   Frequency of Communication with Friends and Family: More than three times a week   Frequency of Social Gatherings with Friends and Family: More than three times a week   Attends Religious Services: Not on Scientist, clinical (histocompatibility and immunogenetics) or Organizations: Not on file   Attends Banker Meetings: Not on file   Marital Status: Not on file    Tobacco Counseling Counseling given: Not Answered   Clinical Intake:  Pre-visit preparation completed: Yes        Diabetes: No  How often do you need to have someone help you when you read instructions, pamphlets, or other written materials from your doctor or pharmacy?: 1 - Never    Interpreter Needed?: No      Activities of Daily Living    02/10/2022   11:45 AM  In your present state of health, do you have any difficulty performing the following activities:  Hearing? 0  Vision? 0  Difficulty concentrating or making decisions? 0  Walking or climbing stairs? 0  Dressing or bathing? 0  Doing errands, shopping? 0  Preparing Food and eating ? N  Using the Toilet? N  In the past six months,  have you accidently leaked urine? N  Do you have problems with loss of bowel control? N  Managing your Medications? N  Managing your Finances? N  Housekeeping or managing your Housekeeping? N    Patient Care Team: Dale Apollo, MD as PCP - General (Internal Medicine) Antonieta Iba, MD as PCP - Cardiology (Cardiology)  Indicate any recent Medical Services you may have received from other than [facility] providers in the past year (date may be approximate).     Assessment:   This is a routine wellness examination for Allison [name].  Virtual Visit via Telephone Note  I connected with  Allison [name] on 02/10/22 at 11:30 AM EDT by telephone and verified that I am speaking with the correct person using two identifiers.  Persons participating in the virtual visit: patient/Nurse Health Advisor   I discussed the limitations of performing an evaluation and management service by telehealth. The patient expressed understanding and agreed to proceed. We continued and completed visit with audio only. Some vital signs may be absent or patient reported.   Hearing/Vision screen Hearing Screening - Comments:: Patient is able to hear conversational tones without difficulty. No issues reported.  Vision Screening - Comments:: Wears reader lenses They have seen their ophthalmologist in the last 12 months.    Dietary issues and exercise activities discussed: Current Exercise Habits: Home exercise routine, Type of exercise: walking, Time (Minutes): 30, Frequency (Times/Week): 5, Weekly Exercise (Minutes/Week): 150, Intensity: Mild Healthy diet Good water intake   Goals Addressed               This Visit's Progress     Patient Stated     Increase physical activity (pt-stated)   On track     I would like to walk more for exercise.       Depression Screen    02/10/2022   11:42 AM 09/28/2020    2:49 PM 06/25/2020   12:42 PM 08/23/2019   10:45 AM 07/27/2018    3:45 PM 07/21/2017    2:03 PM  07/21/2017    1:55 PM  PHQ 2/9 Scores  PHQ - 2 Score 0 0 0 0 0 0 0  PHQ- 9 Score      1     Fall Risk    02/10/2022   11:44 AM 11/20/2020    3:36 PM 09/28/2020    2:49 PM 06/25/2020   12:44 PM 07/27/2018    3:45 PM  Fall Risk   Falls in the past year? 0 0 0 0 No  Number falls in past yr: 0 0 0 0   Injury with Fall?  0 0    Follow up Falls evaluation completed Falls evaluation completed Falls evaluation completed Falls evaluation completed     FALL RISK PREVENTION PERTAINING TO THE HOME: Home free of loose throw rugs in walkways, pet beds, electrical cords, etc? Yes  Adequate lighting in your home to reduce risk of falls? Yes   ASSISTIVE DEVICES UTILIZED TO PREVENT FALLS: Use of a cane, walker or w/c? No   TIMED UP AND GO: Was the test performed? No .   Cognitive Function:  Patient is alert and oriented x3.      Immunizations Immunization History  Administered Date(s) Administered   Fluad Quad(high Dose 65+) 08/23/2019, 09/21/2020, 10/05/2021   Influenza Split 07/10/2013   Influenza, High Dose Seasonal PF 07/27/2018   Influenza,inj,Quad PF,6+ Mos 07/08/2014, 07/21/2015, 07/15/2016, 07/21/2017   PFIZER(Purple Top)SARS-COV-2 Vaccination 01/13/2020, 02/05/2020, 12/25/2020   Pneumococcal Conjugate-13 09/13/2019   Pneumococcal Polysaccharide-23 09/28/2020   Td 09/26/2016   Shingrix Completed?: No.    Education has been provided regarding the importance of this vaccine. Patient has been advised to call insurance company to determine out of pocket expense if they have not yet received this vaccine. Advised may also receive vaccine at local pharmacy  or Health Dept. Verbalized acceptance and understanding.  Screening Tests Health Maintenance  Topic Date Due   MAMMOGRAM  08/04/2021   COVID-19 Vaccine (4 - Booster for Pfizer series) 02/26/2022 (Originally 02/19/2021)   COLONOSCOPY (Pts 45-52yrs Insurance coverage will need to be confirmed)  03/22/2022 (Originally 12/23/2021)    Hepatitis C Screening  03/22/2022 (Originally 08/30/1969)   Zoster Vaccines- Shingrix (1 of 2) 05/12/2022 (Originally 08/31/1970)   INFLUENZA VACCINE  05/24/2022   TETANUS/TDAP  09/26/2026   Pneumonia Vaccine 5+ Years old  Completed   DEXA SCAN  Completed   HPV VACCINES  Aged Out   Health Maintenance Health Maintenance Due  Topic Date Due   MAMMOGRAM  08/04/2021   Colonoscopy- deferred due to patient preference for further discussion with PCP.  Mammogram- deferred due to patient preference for further discussion with PCP.  Lung Cancer Screening: (Low Dose CT Chest recommended if Age 44-80 years, 30 pack-year currently smoking OR have quit w/in 15years.) does not qualify.   Hep C screening- deferred due to patient preference for further discussion with PCP.  Vision Screening: Recommended annual ophthalmology exams for early detection of glaucoma and other disorders of the eye.  Dental Screening: Recommended annual dental exams for proper oral hygiene. Visits every 6 months.   Community Resource Referral / Chronic Care Management: CRR required this visit?  No   CCM required this visit?  No      Plan:   Keep all routine maintenance appointments.   I have personally reviewed and noted the following in the patient's chart:   Medical and social history Use of alcohol, tobacco or illicit drugs  Current medications and supplements including opioid prescriptions.  Functional ability and status Nutritional status Physical activity Advanced directives List of other physicians Hospitalizations, surgeries, and ER visits in previous 12 months Vitals Screenings to include cognitive, depression, and falls Referrals and appointments  In addition, I have reviewed and discussed with patient certain preventive protocols, quality metrics, and best practice recommendations. A written personalized care plan for preventive services as well as general preventive health recommendations were  provided to patient.     Ashok Pall, LPN   11/01/3233

## 2022-02-16 IMAGING — MG DIGITAL SCREENING BILAT W/ TOMO W/ CAD
8 series · 8 of 24 positions shown · non-contrast
Comparison: Previous exam(s).

CLINICAL DATA: Screening.

EXAM:
DIGITAL SCREENING BILATERAL MAMMOGRAM WITH TOMO AND CAD

[R CC synth-2D]
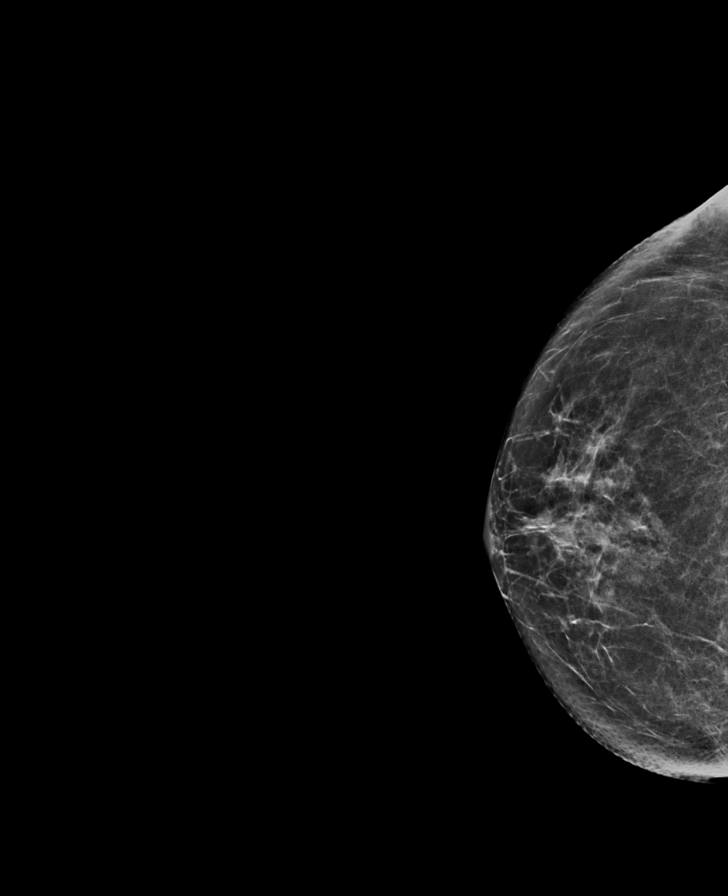

[R MLO synth-2D]
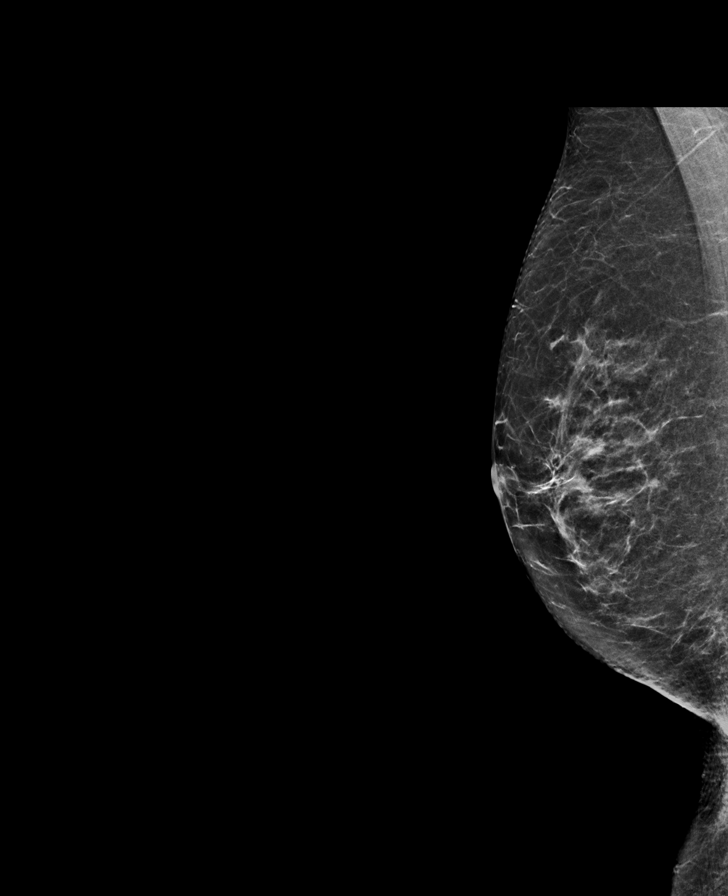

[L CC synth-2D]
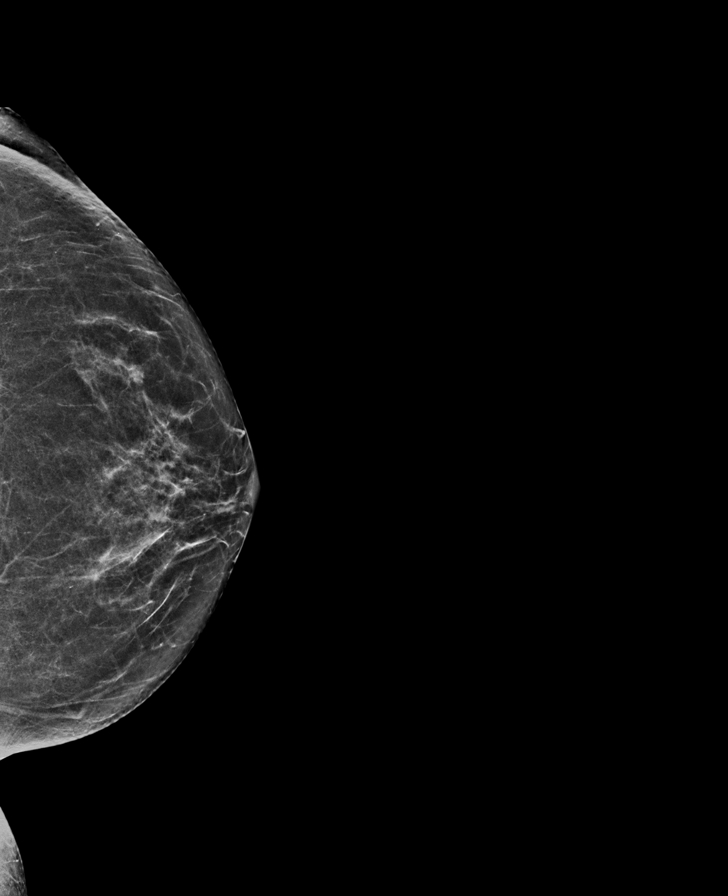

[L MLO synth-2D]
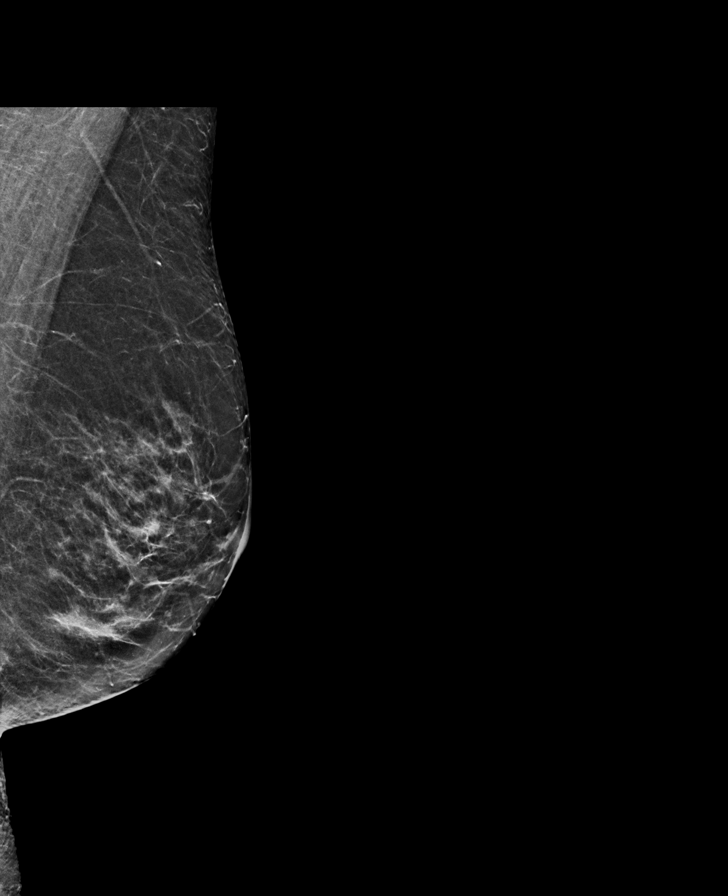

[R MLO tomo · tomo slice 31/60.0]
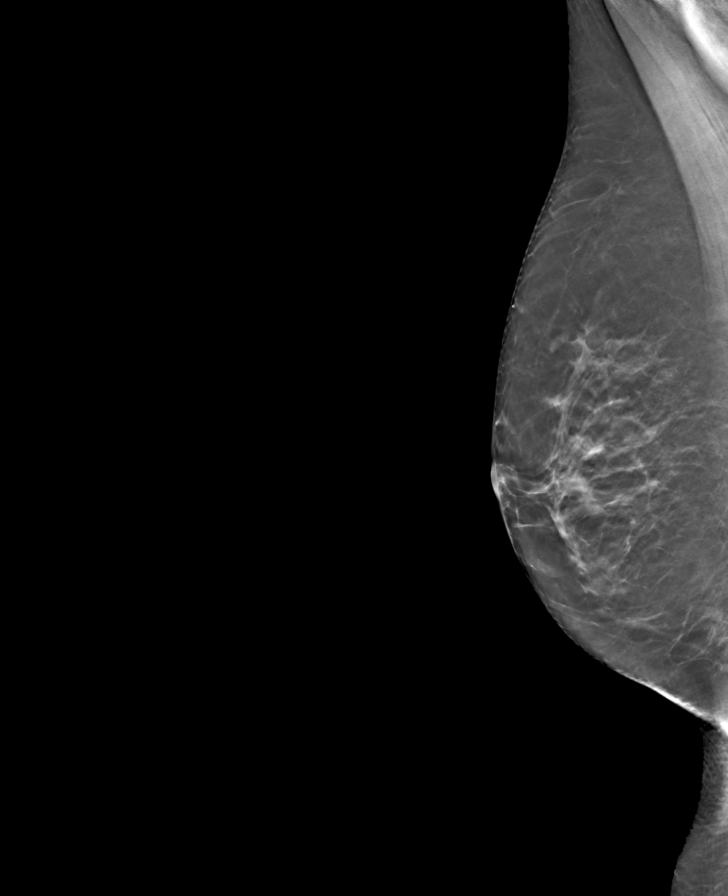

[R CC tomo · tomo slice 30/59.0]
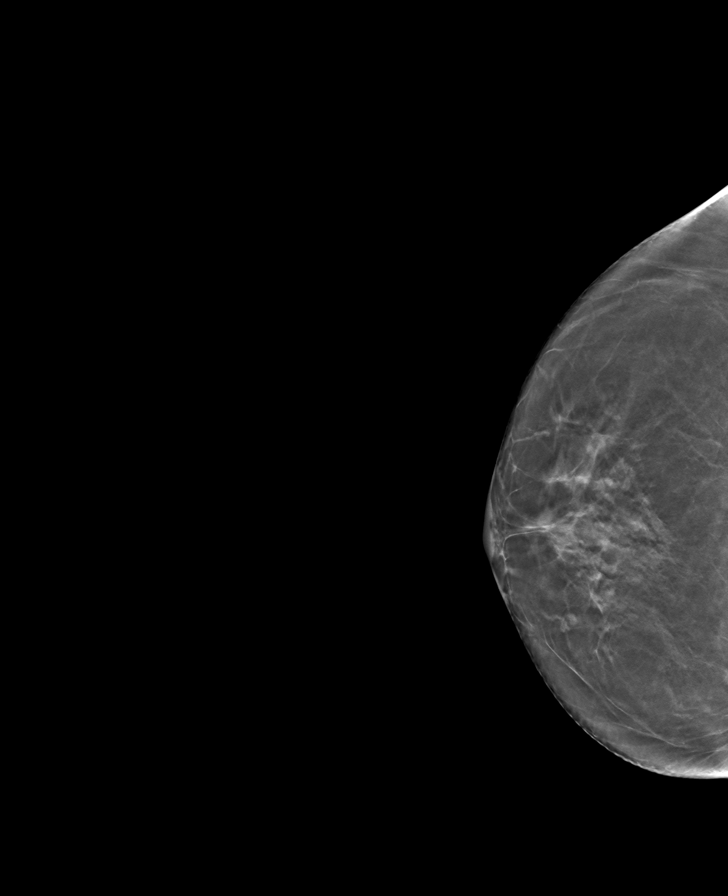

[L CC tomo · tomo slice 30/59.0]
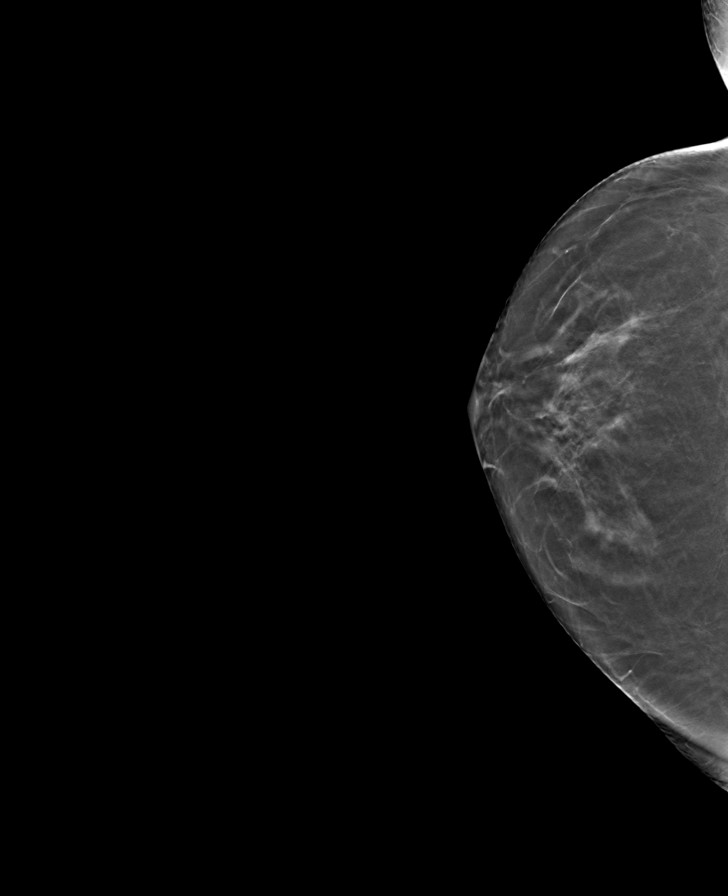

[L MLO tomo · tomo slice 30/59.0]
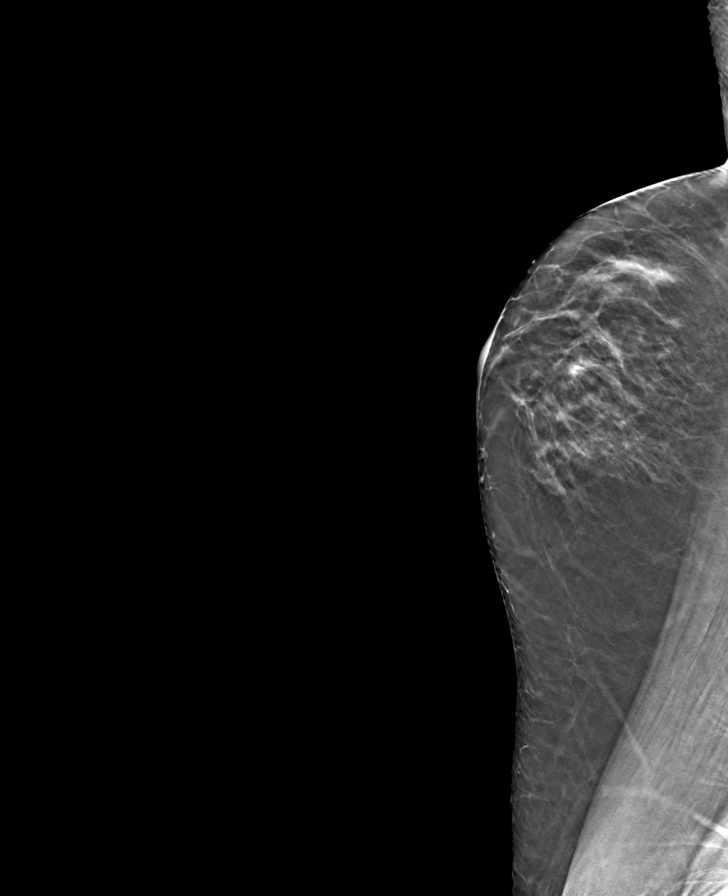

[8 of 24 positions shown; findings below may reference images not displayed]

ACR Breast Density Category b: There are scattered areas of
fibroglandular density.
FINDINGS: In the left breast, a possible asymmetry warrants further
evaluation. In the right breast, no findings suspicious for
malignancy. Images were processed with CAD.
IMPRESSION: Further evaluation is suggested for possible asymmetry in the left
breast.

RECOMMENDATION:
Diagnostic mammogram and possibly ultrasound of the left breast.
(Code:DI-5-LL4)

The patient will be contacted regarding the findings, and additional
imaging will be scheduled.

BI-RADS CATEGORY  0: Incomplete. Need additional imaging evaluation
and/or prior mammograms for comparison.

## 2022-02-22 ENCOUNTER — Other Ambulatory Visit: Payer: Self-pay | Admitting: Cardiovascular Disease

## 2022-02-22 NOTE — Telephone Encounter (Signed)
Please contact pt for future appointment. Pt due for 12 month f/u. 

## 2022-02-22 NOTE — Telephone Encounter (Signed)
Pt last saw Dr Rockey Situ 03/08/21, due for yearly follow-up has appt scheduled for 04/04/22.  Last labs 10/05/21 Creat 0.83, age 71, weight 74.8kg, CrCl 75.54, based on CrCl pt is on appropriate dosage of Xarelto '20mg'$  QD for afib.  Will refill rx  ?

## 2022-02-22 NOTE — Telephone Encounter (Signed)
Refill Request.  

## 2022-02-22 NOTE — Telephone Encounter (Signed)
scheduled

## 2022-03-05 IMAGING — MG MM DIGITAL DIAGNOSTIC UNILAT*L* W/ TOMO W/ CAD
3 series · 3 of 11 positions shown · non-contrast
Comparison: Previous exam(s).

CLINICAL DATA: Possible asymmetry in the central left breast in the
oblique projection of a recent screening mammogram.

EXAM:
DIGITAL DIAGNOSTIC UNILATERAL LEFT MAMMOGRAM WITH TOMO AND CAD

[L MLO synth-2D]
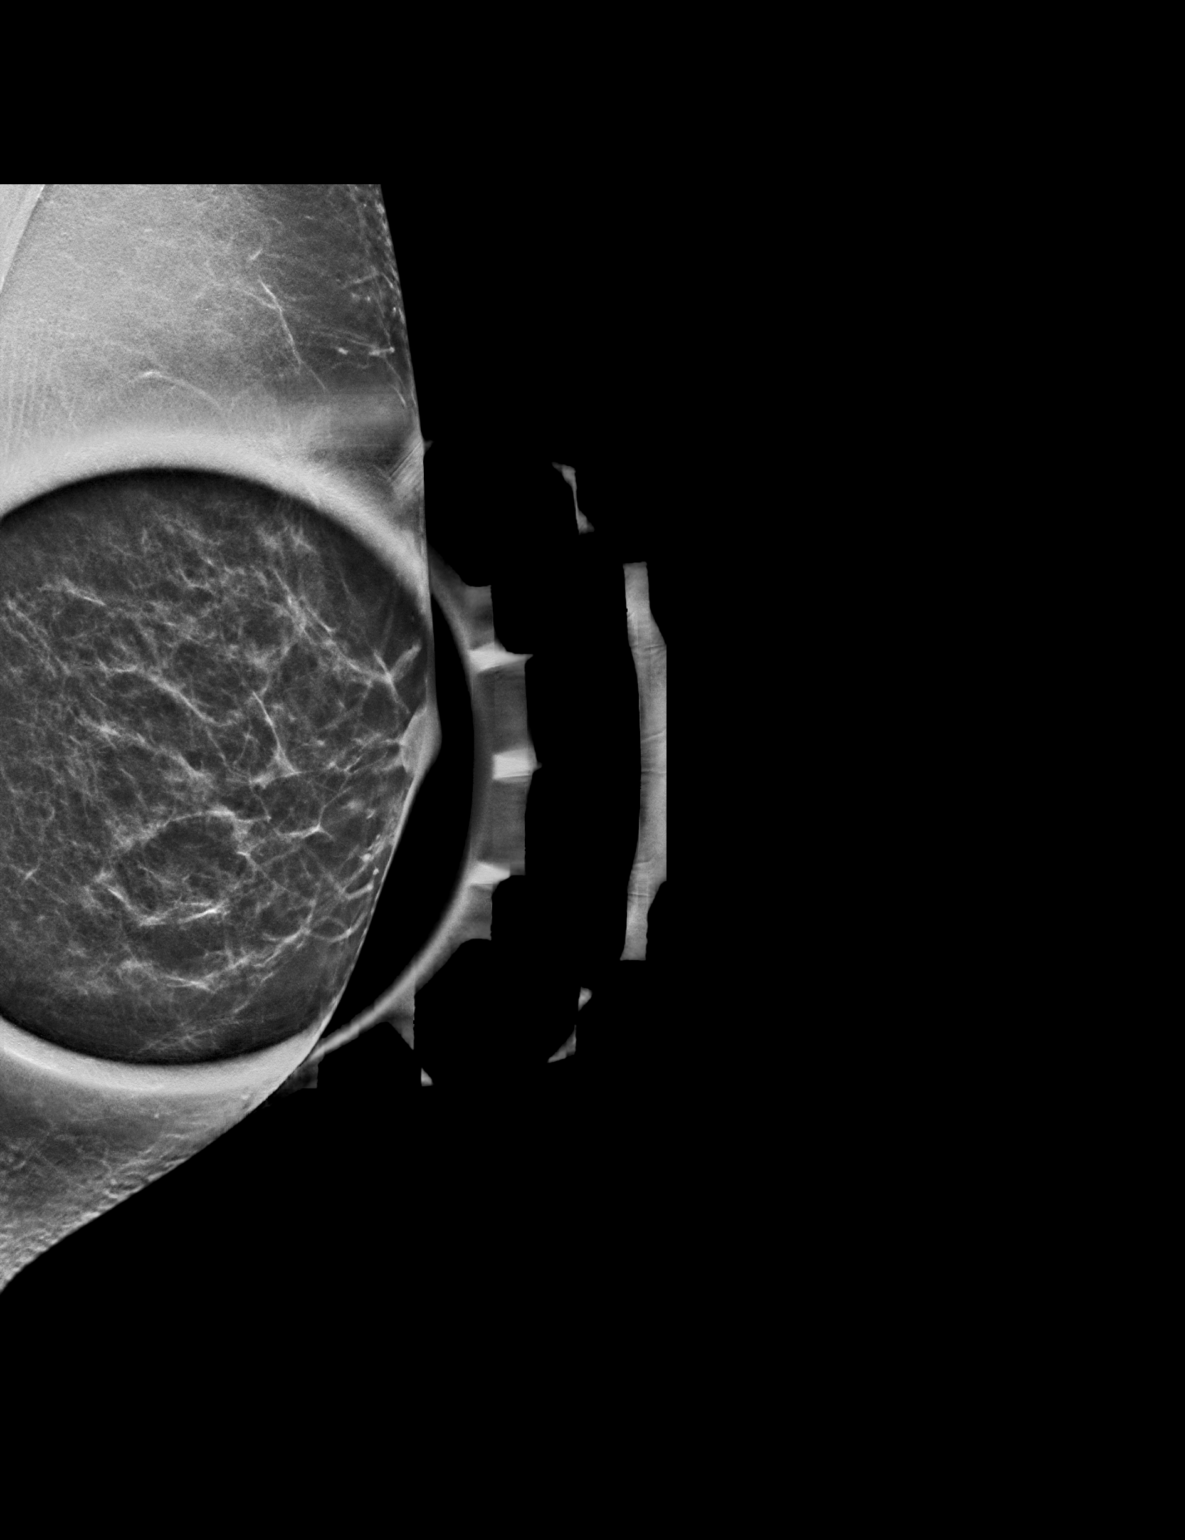

[L ML tomo · tomo slice 29/56.0]
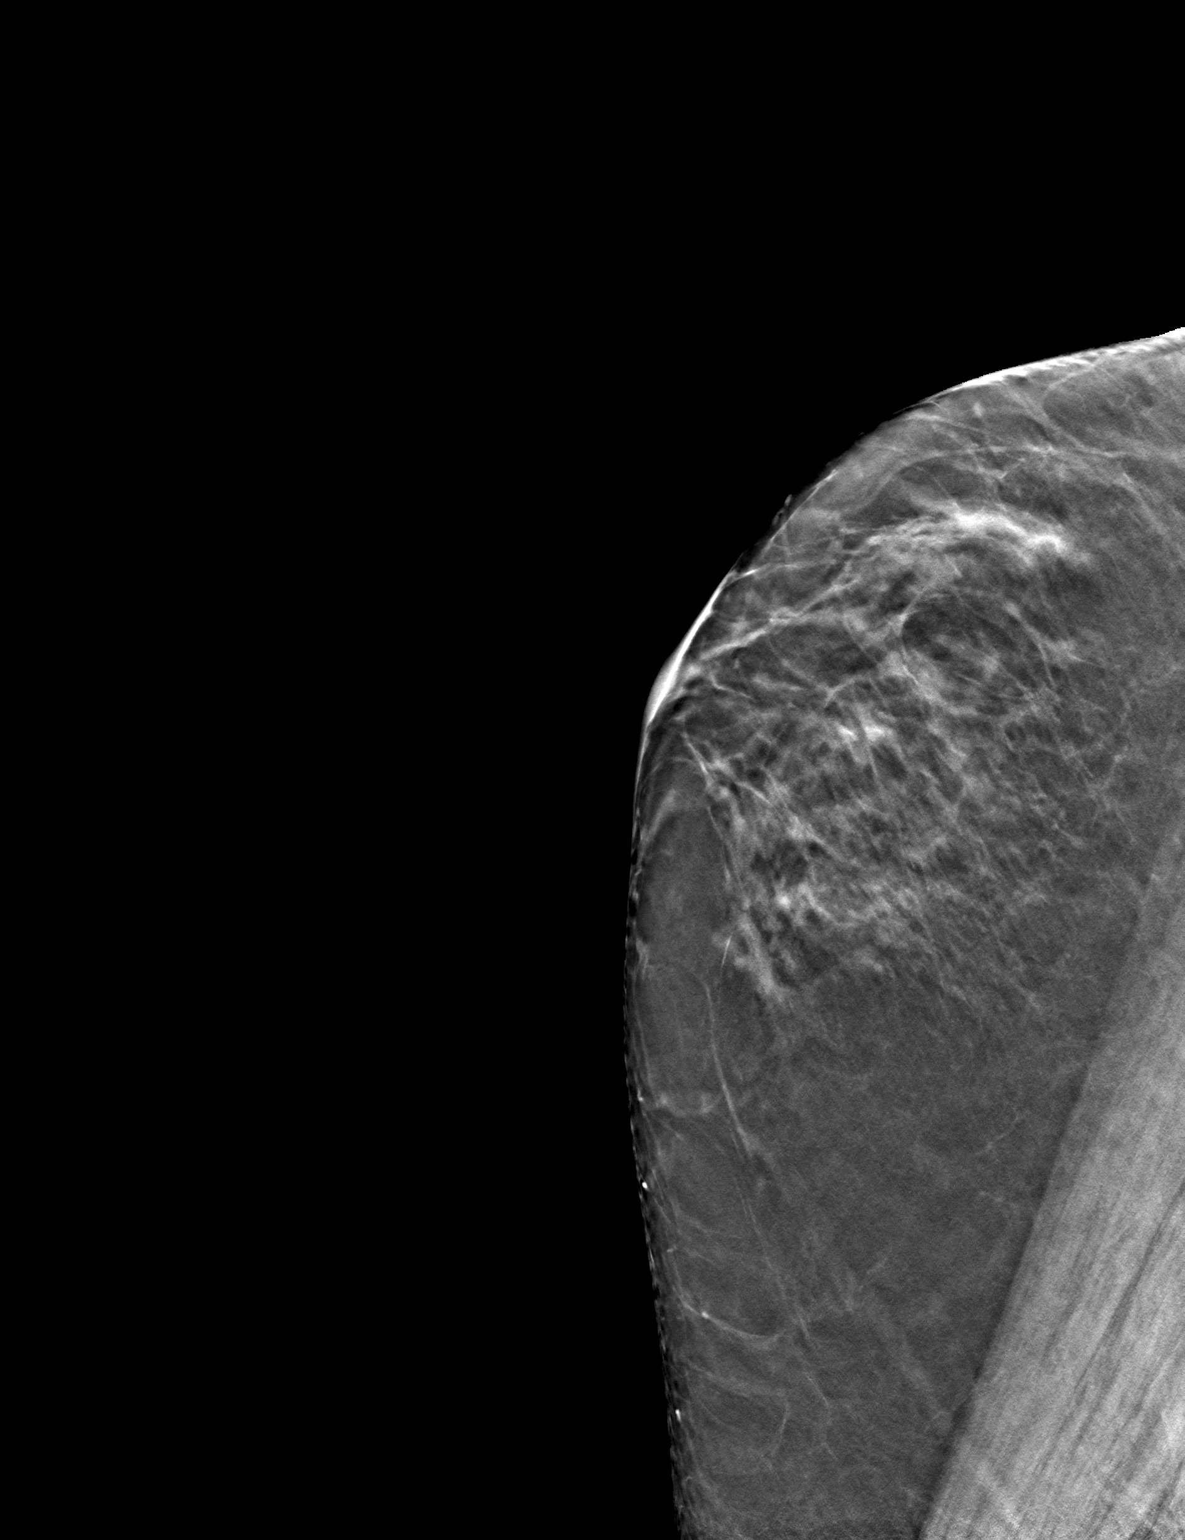

[L MLO tomo · tomo slice 25/50.0]
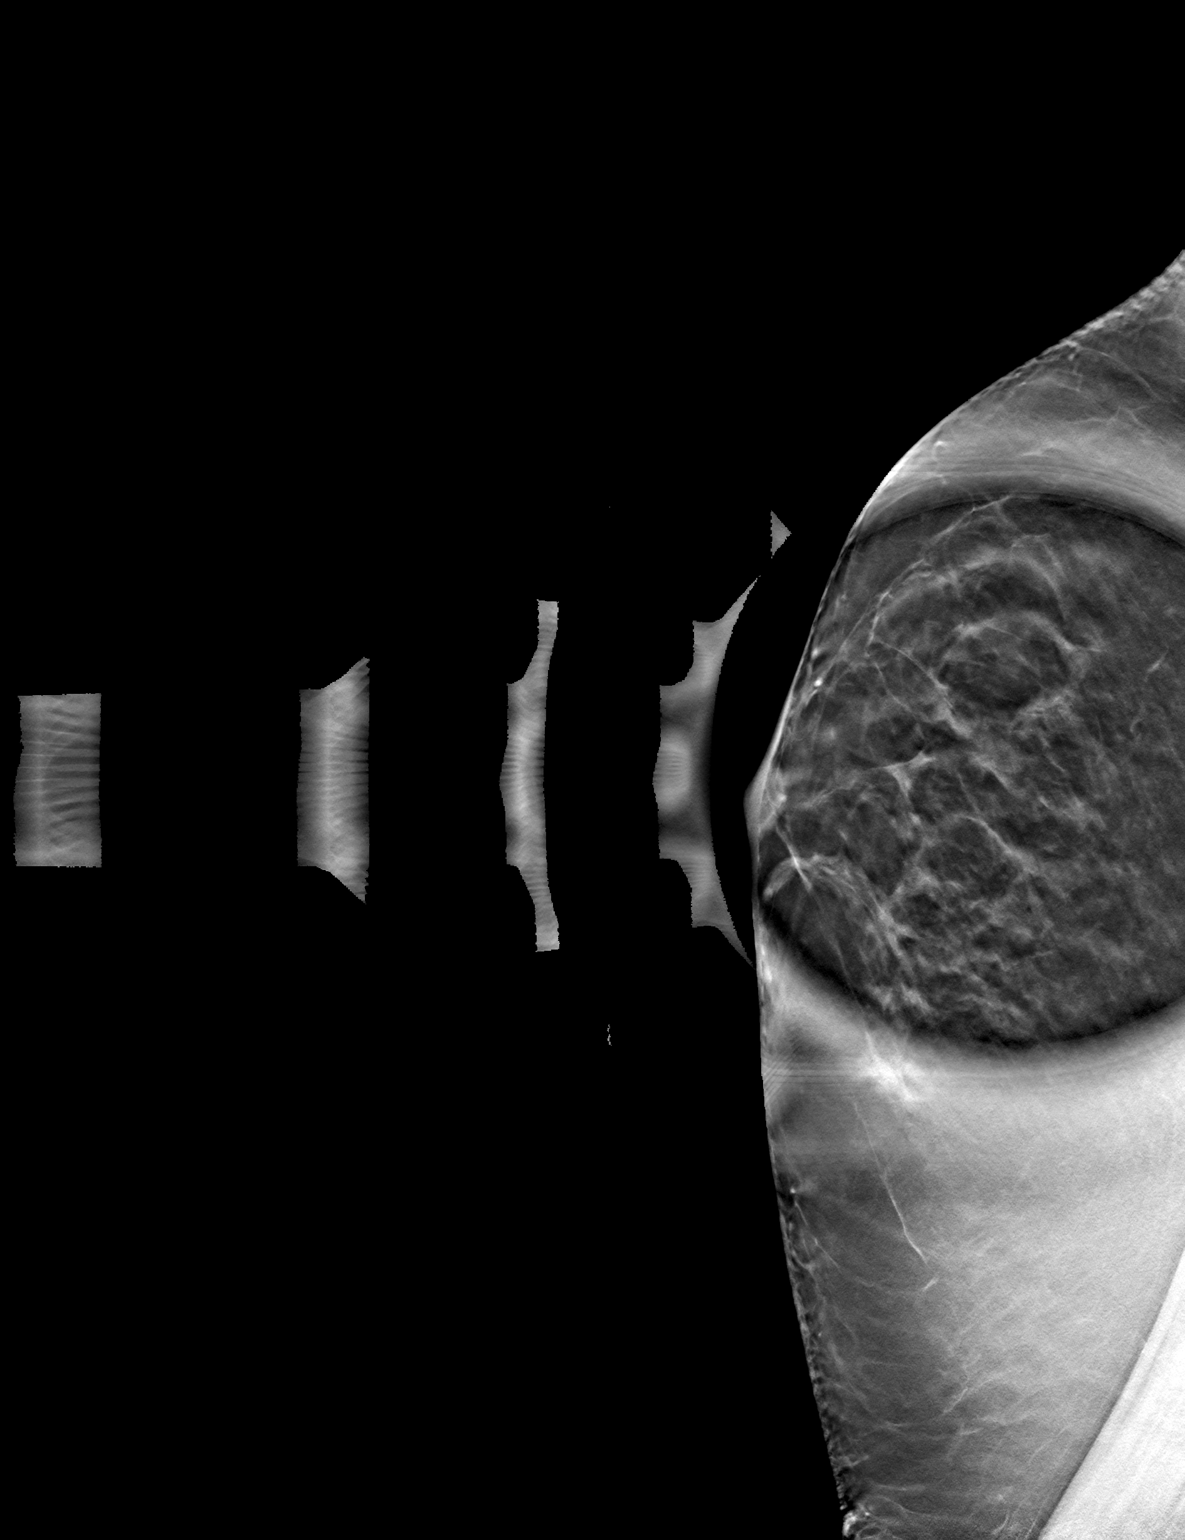

[3 of 11 positions shown; findings below may reference images not displayed]

ACR Breast Density Category b: There are scattered areas of
fibroglandular density.
FINDINGS: 3D tomographic and 2D generated true lateral and spot compression
oblique images of the left breast were obtained. These demonstrate
normal appearing fibroglandular tissue at the location of recently
suspected asymmetry.

Mammographic images were processed with CAD.
IMPRESSION: No evidence of malignancy. The recently suspected left breast
asymmetry was close apposition of normal breast tissue.

RECOMMENDATION:
Bilateral screening mammogram in 1 year.

I have discussed the findings and recommendations with the patient.
If applicable, a reminder letter will be sent to the patient
regarding the next appointment.

BI-RADS CATEGORY  1: Negative.

## 2022-03-23 ENCOUNTER — Ambulatory Visit (INDEPENDENT_AMBULATORY_CARE_PROVIDER_SITE_OTHER): Payer: Medicare Other | Admitting: Internal Medicine

## 2022-03-23 ENCOUNTER — Encounter: Payer: Self-pay | Admitting: Internal Medicine

## 2022-03-23 ENCOUNTER — Ambulatory Visit (INDEPENDENT_AMBULATORY_CARE_PROVIDER_SITE_OTHER): Payer: Medicare Other

## 2022-03-23 VITALS — BP 132/72 | HR 67 | Temp 98.2°F | Resp 14 | Ht 68.0 in | Wt 162.0 lb

## 2022-03-23 DIAGNOSIS — K219 Gastro-esophageal reflux disease without esophagitis: Secondary | ICD-10-CM | POA: Diagnosis not present

## 2022-03-23 DIAGNOSIS — I10 Essential (primary) hypertension: Secondary | ICD-10-CM | POA: Diagnosis not present

## 2022-03-23 DIAGNOSIS — I480 Paroxysmal atrial fibrillation: Secondary | ICD-10-CM | POA: Diagnosis not present

## 2022-03-23 DIAGNOSIS — R739 Hyperglycemia, unspecified: Secondary | ICD-10-CM | POA: Diagnosis not present

## 2022-03-23 DIAGNOSIS — M810 Age-related osteoporosis without current pathological fracture: Secondary | ICD-10-CM | POA: Diagnosis not present

## 2022-03-23 DIAGNOSIS — E7800 Pure hypercholesterolemia, unspecified: Secondary | ICD-10-CM | POA: Diagnosis not present

## 2022-03-23 DIAGNOSIS — M545 Low back pain, unspecified: Secondary | ICD-10-CM

## 2022-03-23 DIAGNOSIS — Z0000 Encounter for general adult medical examination without abnormal findings: Secondary | ICD-10-CM | POA: Diagnosis not present

## 2022-03-23 DIAGNOSIS — S32030A Wedge compression fracture of third lumbar vertebra, initial encounter for closed fracture: Secondary | ICD-10-CM | POA: Diagnosis not present

## 2022-03-23 DIAGNOSIS — Z1211 Encounter for screening for malignant neoplasm of colon: Secondary | ICD-10-CM

## 2022-03-23 LAB — BASIC METABOLIC PANEL
BUN: 16 mg/dL (ref 6–23)
CO2: 29 mEq/L (ref 19–32)
Calcium: 9.2 mg/dL (ref 8.4–10.5)
Chloride: 104 mEq/L (ref 96–112)
Creatinine, Ser: 0.78 mg/dL (ref 0.40–1.20)
GFR: 77.42 mL/min (ref >60.00)
Glucose, Bld: 95 mg/dL (ref 70–99)
Potassium: 4.1 mEq/L (ref 3.5–5.1)
Sodium: 142 mEq/L (ref 135–145)

## 2022-03-23 LAB — HEPATIC FUNCTION PANEL
ALT: 15 U/L (ref 0–35)
AST: 23 U/L (ref 0–37)
Albumin: 4.3 g/dL (ref 3.5–5.2)
Alkaline Phosphatase: 66 U/L (ref 39–117)
Bilirubin, Direct: 0.1 mg/dL (ref 0.0–0.3)
Total Bilirubin: 0.5 mg/dL (ref 0.2–1.2)
Total Protein: 6.7 g/dL (ref 6.0–8.3)

## 2022-03-23 LAB — LIPID PANEL
Cholesterol: 161 mg/dL (ref 0–200)
HDL: 65 mg/dL (ref >39.00)
LDL Cholesterol: 78 mg/dL (ref 0–99)
NonHDL: 95.6
Total CHOL/HDL Ratio: 2
Triglycerides: 87 mg/dL (ref 0.0–149.0)
VLDL: 17.4 mg/dL (ref 0.0–40.0)

## 2022-03-23 LAB — CBC WITH DIFFERENTIAL/PLATELET
Basophils Absolute: 0 10*3/uL (ref 0.0–0.1)
Basophils Relative: 0.7 % (ref 0.0–3.0)
Eosinophils Absolute: 0.2 10*3/uL (ref 0.0–0.7)
Eosinophils Relative: 3.1 % (ref 0.0–5.0)
HCT: 43.1 % (ref 36.0–46.0)
Hemoglobin: 14.3 g/dL (ref 12.0–15.0)
Lymphocytes Relative: 19.5 % (ref 12.0–46.0)
Lymphs Abs: 1.2 10*3/uL (ref 0.7–4.0)
MCHC: 33.2 g/dL (ref 30.0–36.0)
MCV: 94.2 fl (ref 78.0–100.0)
Monocytes Absolute: 0.6 10*3/uL (ref 0.1–1.0)
Monocytes Relative: 10 % (ref 3.0–12.0)
Neutro Abs: 4.1 10*3/uL (ref 1.4–7.7)
Neutrophils Relative %: 66.7 % (ref 43.0–77.0)
Platelets: 229 10*3/uL (ref 150.0–400.0)
RBC: 4.57 Mil/uL (ref 3.87–5.11)
RDW: 12.9 % (ref 11.5–15.5)
WBC: 6.1 10*3/uL (ref 4.0–10.5)

## 2022-03-23 LAB — HEMOGLOBIN A1C: Hgb A1c MFr Bld: 5.9 % (ref 4.6–6.5)

## 2022-03-23 NOTE — Progress Notes (Unsigned)
Patient ID: Allison [name], female   DOB: 1951-05-19, 71 y.o.   MRN: [id number]   Subjective:    Patient ID: Allison [name], female    DOB: Apr 04, 1951, 71 y.o.   MRN: [id number]   Patient here for her physical exam.   Chief Complaint  Patient presents with   Follow-up    Yearly CPE   .   HPI Reports she is doing relatively well.  Has had increased issues with her teeth.  Seeing a dentist.  Has noticed over the last couple of months (maybe 1x/week) - will wake up with a "different taste" in her mouth.  Will rinse her mouth and blood will be present.  Denies any hemoptysis.  No cough or increased congestion.  No sore throat or sore gums.  Feels related to her teeth.  Plans to f/u with her dentist.  No chest pain.  States a couple of weeks ago, woke up in the middle of the night and noticed increased heart rate.  Took diltiazem x 2.  Resolved.  No other episodes.  Tries to stay active.  No sob.  No acid reflux reported.  No abdominal pain.  Bowels moving.  Does report pain left lower back - out lateral to left buttock.  No left groin pain. Present over the last couple of months.     Past Medical History:  Diagnosis Date   A-fib [facility])    GERD (gastroesophageal reflux disease)    Hypercholesterolemia    Hypertension    Migraine headache    Psoriasis    Past Surgical History:  Procedure Laterality Date   COLONOSCOPY WITH PROPOFOL N/A 12/23/2016   Procedure: COLONOSCOPY WITH PROPOFOL;  Surgeon: Lollie Sails, MD;  Location: [facility] ENDOSCOPY;  Service: Endoscopy;  Laterality: N/A;   DILATION AND CURETTAGE OF UTERUS     history of abnormal bleeding   TUBAL LIGATION     Family History  Problem Relation Age of Onset   Asthma Father    Diabetes Mother    CVA Mother    Psoriasis Mother    Breast cancer Maternal Grandmother    Breast cancer Paternal Grandmother    Colon cancer Maternal Uncle    Colon cancer Other        nephew   Diabetes Other        multiple relatives (both  sides)   Stroke Brother        Light stroke   Diabetes Brother    Social History   Socioeconomic History   Marital status: Single    Spouse name: Not on file   Number of children: 1   Years of education: Not on file   Highest education level: Not on file  Occupational History   Not on file  Tobacco Use   Smoking status: Never   Smokeless tobacco: Never  Vaping Use   Vaping Use: Never used  Substance and Sexual Activity   Alcohol use: No    Alcohol/week: 0.0 standard drinks   Drug use: No   Sexual activity: Not on file  Other Topics Concern   Not on file  Social History Narrative   Not on file   Social Determinants of Health   Financial Resource Strain: Low Risk    Difficulty of Paying Living Expenses: Not hard at all  Food Insecurity: No Food Insecurity   Worried About Charity fundraiser in the Last Year: Never true   [place] in the Last Year:  Never true  Transportation Needs: No Transportation Needs   Lack of Transportation (Medical): No   Lack of Transportation (Non-Medical): No  Physical Activity: Sufficiently Active   Days of Exercise per Week: 5 days   Minutes of Exercise per Session: 30 min  Stress: No Stress Concern Present   Feeling of Stress : Not at all  Social Connections: Unknown   Frequency of Communication with Friends and Family: More than three times a week   Frequency of Social Gatherings with Friends and Family: More than three times a week   Attends Religious Services: Not on Electrical engineer or Organizations: Not on file   Attends Archivist Meetings: Not on file   Marital Status: Not on file     Review of Systems  Constitutional:  Negative for appetite change and unexpected weight change.  HENT:  Negative for congestion, sinus pressure and sore throat.   Eyes:  Negative for pain and visual disturbance.  Respiratory:  Negative for cough, chest tightness and shortness of breath.   Cardiovascular:  Negative  for chest pain and leg swelling.       Episode of increased heart rate as outlined.   Gastrointestinal:  Negative for abdominal pain, diarrhea, nausea and vomiting.  Genitourinary:  Negative for difficulty urinating and dysuria.  Musculoskeletal:  Positive for back pain. Negative for joint swelling and myalgias.  Skin:  Negative for color change and rash.  Neurological:  Negative for dizziness, light-headedness and headaches.  Hematological:  Negative for adenopathy. Does not bruise/bleed easily.  Psychiatric/Behavioral:  Negative for agitation and dysphoric mood.       Objective:     BP 132/72 (BP Location: Left Arm, Patient Position: Sitting, Cuff Size: Small)   Pulse 67   Temp 98.2 F (36.8 C) (Temporal)   Resp 14   Ht _0  (1.727 m)   Wt 162 lb (73.5 kg)   SpO2 97%   BMI 24.63 kg/m  Wt Readings from Last 3 Encounters:  03/23/22 162 lb (73.5 kg)  02/10/22 165 lb (74.8 kg)  10/05/21 165 lb 9.6 oz (75.1 kg)    Physical Exam Vitals reviewed.  Constitutional:      General: She is not in acute distress.    Appearance: Normal appearance. She is well-developed.  HENT:     Head: Normocephalic and atraumatic.     Right Ear: External ear normal.     Left Ear: External ear normal.  Eyes:     General: No scleral icterus.       Right eye: No discharge.        Left eye: No discharge.     Conjunctiva/sclera: Conjunctivae normal.  Neck:     Thyroid: No thyromegaly.  Cardiovascular:     Rate and Rhythm: Normal rate and regular rhythm.  Pulmonary:     Effort: No tachypnea, accessory muscle usage or respiratory distress.     Breath sounds: Normal breath sounds. No decreased breath sounds or wheezing.  Chest:  Breasts:    Right: No inverted nipple, mass, nipple discharge or tenderness (no axillary adenopathy).     Left: No inverted nipple, mass, nipple discharge or tenderness (no axilarry adenopathy).  Abdominal:     General: Bowel sounds are normal.     Palpations: Abdomen  is soft.     Tenderness: There is no abdominal tenderness.  Musculoskeletal:        General: No swelling or tenderness.     Cervical  back: Neck supple.     Comments: No pain in groin with abduction/adduction.    Lymphadenopathy:     Cervical: No cervical adenopathy.  Skin:    Findings: No erythema or rash.  Neurological:     Mental Status: She is alert and oriented to person, place, and time.  Psychiatric:        Mood and Affect: Mood normal.        Behavior: Behavior normal.     Outpatient Encounter Medications as of 03/23/2022  Medication Sig   alendronate (FOSAMAX) 70 MG tablet Take 1 tablet (70 mg total) by mouth every 7 (seven) days. Take with a full glass of water on an empty stomach.   ALPRAZolam (XANAX) 0.25 MG tablet TAKE 1 TABLET BY MOUTH ONCE DAILY AS NEEDED FOR ANXIETY   diltiazem (CARDIZEM CD) 120 MG 24 hr capsule Take 1 capsule by mouth once daily   diltiazem (CARDIZEM) 30 MG tablet TAKE 1 TABLET BY MOUTH THREE TIMES DAILY AS NEEDED FOR  TACHYCARDIA/  ATRIAL  FIB   hyoscyamine (LEVSIN) 0.125 MG tablet TAKE 1 TABLET BY MOUTH EVERY 6 HOURS AS NEEDED   metoprolol succinate (TOPROL-XL) 50 MG 24 hr tablet TAKE 1 & 1/2 (ONE & ONE-HALF) TABLETS BY MOUTH ONCE DAILY WITH OR IMMEDIATELY FOLLOWING A MEAL   omeprazole (PRILOSEC) 20 MG capsule Take 1 capsule by mouth once daily   potassium chloride (KLOR-CON) 10 MEQ tablet Take 1 tablet by mouth once daily   rivaroxaban (XARELTO) 20 MG TABS tablet TAKE 1 TABLET BY MOUTH ONCE DAILY WITH SUPPER   rosuvastatin (CRESTOR) 10 MG tablet Take 1 tablet by mouth once daily   No facility-administered encounter medications on file as of 03/23/2022.     Lab Results  Component Value Date   WBC 6.1 03/23/2022   HGB 14.3 03/23/2022   HCT 43.1 03/23/2022   PLT 229.0 03/23/2022   GLUCOSE 95 03/23/2022   CHOL 161 03/23/2022   TRIG 87.0 03/23/2022   HDL 65.00 03/23/2022   LDLCALC 78 03/23/2022   ALT 15 03/23/2022   AST 23 03/23/2022   NA  142 03/23/2022   K 4.1 03/23/2022   CL 104 03/23/2022   CREATININE 0.78 03/23/2022   BUN 16 03/23/2022   CO2 29 03/23/2022   TSH 0.98 10/05/2021   INR 2.3 (H) 12/13/2020   HGBA1C 5.9 03/23/2022    DG Chest 2 View  Result Date: 12/13/2020 CLINICAL DATA:  Palpitations. EXAM: CHEST - 2 VIEW COMPARISON:  July 26, 2020 FINDINGS: The lungs are hyperinflated. Mild, diffuse chronic appearing increased lung markings are seen. There is no evidence of acute infiltrate, pleural effusion or pneumothorax. The heart size and mediastinal contours are within normal limits. The visualized skeletal structures are unremarkable. IMPRESSION: No active cardiopulmonary disease. Electronically Signed   By: Virgina Norfolk M.D.   On: 12/13/2020 20:29       Assessment & Plan:   Problem List Items Addressed This Visit     GERD (gastroesophageal reflux disease)    Controlled on prilosec.        Health care maintenance    Physical today 03/23/22.  PAP 07/2018 - negative with negative HPV.  Mammogram - due.  Scheduled - 04/14/22 Colonoscopy 12/2016.  States due.  Refer to GI for f/u.         Hypercholesterolemia    On Crestor.  Low cholesterol diet and exercise.  Follow lipid panel and liver function tests.  Relevant Orders   Lipid Profile (Completed)   Hepatic function panel (Completed)   CBC with Differential/Platelet (Completed)   Hyperglycemia    Continue low carb diet and exercise.  Follow met b and a1c.        Relevant Orders   HgB A1c (Completed)   Hypertension - Primary    Continue metoprolol and cardizem. Blood pressure doing well.  Follow pressures.  Follow met b.        Relevant Orders   Basic Metabolic Panel (BMET) (Completed)   Low back pain    Low back pain as outlined. Persistent.  Check L-S spine xray.        Relevant Orders   DG Lumbar Spine 2-3 Views   Osteoporosis    On fosamax.  Tolerating.  Follow.        Paroxysmal atrial fibrillation (HCC)     Continue xarelto.  Continue cardizem CD 12m q day and toprol 733mq day.  Appears to be doing well on these medications.  Has diltiazem 3062mrn to take if needed.  Had episode a couple of weeks ago as outlined.  Took her prn cardizem x 2.  Resolved.  No other issues.  EKG today - SR with no acute ischemic changes.  Has f/u soon with Dr GolRockey Situo changes made today. Check routine labs.        Relevant Orders   EKG 12-Lead (Completed)   Other Visit Diagnoses     Colon cancer screening       Relevant Orders   Ambulatory referral to Gastroenterology        ChaEinar PheasantD

## 2022-03-24 ENCOUNTER — Encounter: Payer: Self-pay | Admitting: Internal Medicine

## 2022-03-24 NOTE — Assessment & Plan Note (Signed)
Low back pain as outlined. Persistent.  Check L-S spine xray.

## 2022-03-24 NOTE — Assessment & Plan Note (Addendum)
On Crestor.  Low cholesterol diet and exercise.  Follow lipid panel and liver function tests.   

## 2022-03-24 NOTE — Assessment & Plan Note (Signed)
Continue xarelto.  Continue cardizem CD '120mg'$  q day and toprol '75mg'$  q day.  Appears to be doing well on these medications.  Has diltiazem '30mg'$  prn to take if needed.  Had episode a couple of weeks ago as outlined.  Took her prn cardizem x 2.  Resolved.  No other issues.  EKG today - SR with no acute ischemic changes.  Has f/u soon with Dr Rockey Situ. No changes made today. Check routine labs.

## 2022-03-24 NOTE — Assessment & Plan Note (Signed)
Continue metoprolol and cardizem. Blood pressure doing well.  Follow pressures.  Follow met b.

## 2022-03-24 NOTE — Assessment & Plan Note (Signed)
Controlled on prilosec.   

## 2022-03-24 NOTE — Assessment & Plan Note (Addendum)
Physical today 03/23/22.  PAP 07/2018 - negative with negative HPV.  Mammogram - due.  Scheduled - 04/14/22 Colonoscopy 12/2016.  States due.  Refer to GI for f/u.

## 2022-03-24 NOTE — Assessment & Plan Note (Signed)
Continue low carb diet and exercise.  Follow met b and a1c.  

## 2022-03-24 NOTE — Assessment & Plan Note (Signed)
On fosamax.  Tolerating.  Follow.

## 2022-03-25 ENCOUNTER — Other Ambulatory Visit: Payer: Self-pay

## 2022-03-25 DIAGNOSIS — R0781 Pleurodynia: Secondary | ICD-10-CM

## 2022-03-25 DIAGNOSIS — S2232XD Fracture of one rib, left side, subsequent encounter for fracture with routine healing: Secondary | ICD-10-CM

## 2022-03-25 DIAGNOSIS — M545 Low back pain, unspecified: Secondary | ICD-10-CM

## 2022-03-25 DIAGNOSIS — M810 Age-related osteoporosis without current pathological fracture: Secondary | ICD-10-CM

## 2022-03-26 ENCOUNTER — Other Ambulatory Visit: Payer: Self-pay | Admitting: Internal Medicine

## 2022-03-26 DIAGNOSIS — M545 Low back pain, unspecified: Secondary | ICD-10-CM

## 2022-03-26 NOTE — Progress Notes (Signed)
Order placed for PT referral.  

## 2022-03-29 ENCOUNTER — Other Ambulatory Visit: Payer: Self-pay | Admitting: Cardiovascular Disease

## 2022-04-03 NOTE — Progress Notes (Unsigned)
Cardiology Office Note  Date:  04/04/2022   ID:  Allison [name], DOB 06-Sep-1951, MRN [id number]  PCP:  Einar Pheasant, MD   Chief Complaint  Patient presents with   12 month follow up     Patient c/o having a spell of racing heartbeats about 3 weeks ago and occasional shortness of breath with over exertion.  Medications reviewed by the patient verbally.     HPI:  [name] is a 71 year old woman with past medical history of paroxysmal atrial fibrillation several episodes 01/2019 Essential hypertension Seen in the emergency room January 31, 2019 for Atrial fibrillation Ejection fraction normal greater than 55% in July 2020 Who presents for follow-up of her paroxysmal atrial fibrillation  Last seen by myself in clinic May 2022  Tachycardia 3 weeks ago, woke up overnight with tachypalpitations Took diltiazem x 2 and it went away after 1-2 hours Does not have very many episodes in general Feels well hold on her metoprolol succinate 70 5 in the evening  No recent trips to the emergency room for palpitations ED 12/13/20 due to palpitations.  EKG showed NSR with frequent runs of PACs.   CXR unremarkable.  Suggested she take diltiazem for rate control.  Continues to work at cardiology Active, lots of steps  Echocardiogram done July 2020, normal LV function, RV function, discussed in detail  EKG personally reviewed by myself on todays visit Shows sinus bradycardia rate 58 bpm no significant ST-T wave changes  Other past medical history reviewed CT of ABD 2017, Minimal descending aorta atherosclerosis, was described as moderate but there is minimal plaque  Family history Mother with a CVA, age 60 Brother with TIA/CVA Strong family hx of atrial fib  Atrial fib 01/31/2019, 11 Am developed tachycardia Went to the emergency room, EKG showing atrial fibrillation 190 bpm  given diltiazem IV bolus repeat dosing, improved rate down to 130 bpm Discharged on Cardizem CD  120 mg daily  potassium was low at 3.2, on HCTZ  PMH:   has a past medical history of A-fib ([place]), GERD (gastroesophageal reflux disease), Hypercholesterolemia, Hypertension, Migraine headache, and Psoriasis.  PSH:    Past Surgical History:  Procedure Laterality Date   COLONOSCOPY WITH PROPOFOL N/A 12/23/2016   Procedure: COLONOSCOPY WITH PROPOFOL;  Surgeon: Lollie Sails, MD;  Location: [facility] ENDOSCOPY;  Service: Endoscopy;  Laterality: N/A;   DILATION AND CURETTAGE OF UTERUS     history of abnormal bleeding   TUBAL LIGATION      Current Outpatient Medications  Medication Sig Dispense Refill   alendronate (FOSAMAX) 70 MG tablet Take 1 tablet (70 mg total) by mouth every 7 (seven) days. Take with a full glass of water on an empty stomach. 4 tablet 11   ALPRAZolam (XANAX) 0.25 MG tablet TAKE 1 TABLET BY MOUTH ONCE DAILY AS NEEDED FOR ANXIETY 30 tablet 0   Calcium Carb-Cholecalciferol (CALCIUM 500/VITAMIN D PO) Take 2,000 Units by mouth daily.     diltiazem (CARDIZEM CD) 120 MG 24 hr capsule Take 1 capsule by mouth once daily 90 capsule 0   diltiazem (CARDIZEM) 30 MG tablet TAKE 1 TABLET BY MOUTH THREE TIMES DAILY AS NEEDED FOR  TACHYCARDIA/  ATRIAL  FIB 90 tablet 3   hyoscyamine (LEVSIN) 0.125 MG tablet TAKE 1 TABLET BY MOUTH EVERY 6 HOURS AS NEEDED 30 tablet 0   metoprolol succinate (TOPROL-XL) 50 MG 24 hr tablet TAKE 1 & 1/2 (ONE & ONE-HALF) TABLETS BY MOUTH ONCE DAILY WITH OR IMMEDIATELY FOLLOWING  A MEAL 135 tablet 0   omeprazole (PRILOSEC) 20 MG capsule Take 1 capsule by mouth once daily 90 capsule 3   potassium chloride (KLOR-CON) 10 MEQ tablet Take 1 tablet by mouth once daily 90 tablet 3   rivaroxaban (XARELTO) 20 MG TABS tablet TAKE 1 TABLET BY MOUTH ONCE DAILY WITH SUPPER 90 tablet 1   rosuvastatin (CRESTOR) 10 MG tablet Take 1 tablet by mouth once daily 90 tablet 3   No current facility-administered medications for this visit.    Allergies:   Iodine   Social History:  The  patient  reports that she has never smoked. She has never used smokeless tobacco. She reports that she does not drink alcohol and does not use drugs.   Family History:   family history includes Asthma in her father; Breast cancer in her maternal grandmother and paternal grandmother; CVA in her mother; Colon cancer in her maternal uncle and another family member; Diabetes in her brother, mother, and another family member; Psoriasis in her mother; Stroke in her brother.    Review of Systems: Review of Systems  Constitutional: Negative.   HENT: Negative.    Respiratory: Negative.    Cardiovascular: Negative.   Gastrointestinal: Negative.   Musculoskeletal: Negative.   Neurological: Negative.   Psychiatric/Behavioral: Negative.    All other systems reviewed and are negative.   PHYSICAL EXAM: VS:  BP 110/70 (BP Location: Left Arm, Patient Position: Sitting, Cuff Size: Normal)   Pulse (!) 58   Ht '5\' 8"'$  (1.727 m)   Wt 163 lb (73.9 kg)   SpO2 98%   BMI 24.78 kg/m  , BMI Body mass index is 24.78 kg/m.  Constitutional:  oriented to person, place, and time. No distress.  HENT:  Head: Grossly normal Eyes:  no discharge. No scleral icterus.  Neck: No JVD, no carotid bruits  Cardiovascular: Regular rate and rhythm, no murmurs appreciated Pulmonary/Chest: Clear to auscultation bilaterally, no wheezes or rails Abdominal: Soft.  no distension.  no tenderness.  Musculoskeletal: Normal range of motion Neurological:  normal muscle tone. Coordination normal. No atrophy Skin: Skin warm and dry Psychiatric: normal affect, pleasant  Recent Labs: 10/05/2021: TSH 0.98 03/23/2022: ALT 15; BUN 16; Creatinine, Ser 0.78; Hemoglobin 14.3; Platelets 229.0; Potassium 4.1; Sodium 142    Lipid Panel Lab Results  Component Value Date   CHOL 161 03/23/2022   HDL 65.00 03/23/2022   LDLCALC 78 03/23/2022   TRIG 87.0 03/23/2022    Wt Readings from Last 3 Encounters:  04/04/22 163 lb (73.9 kg)   03/23/22 162 lb (73.5 kg)  02/10/22 165 lb (74.8 kg)     ASSESSMENT AND PLAN:  Problem List Items Addressed This Visit       Cardiology Problems   Paroxysmal atrial fibrillation ([place]) - Primary   Relevant Orders   EKG 12-Lead   Other Visit Diagnoses     PAC (premature atrial contraction)       Relevant Orders   EKG 12-Lead   Essential hypertension       Relevant Orders   EKG 12-Lead   Mixed hyperlipidemia       PVC (premature ventricular contraction)       Chronic anticoagulation         Paroxysmal atrial fibrillation (HCC) -  Continue diltiazem CD 120 mg daily and metoprolol succinate 75 daily CHADS VASC 2, continue xarelto 20 mg daily Recent and successfully use diltiazem 30 mg pill 2 pills to break a tachycardia that presented overnight  Recommend if she has recurrent and frequent episodes that she call our office, may need to start other medications such as flecainide Little room to increase the diltiazem or metoprolol given low blood pressure   Essential hypertension Blood pressure is well controlled on today's visit. No changes made to the medications.   Hypercholesterolemia Cholesterol is at goal on the current lipid regimen. No changes to the medications were made.  Chest pain No recent chest pain symptoms Risk factors well controlled  Aortic atherosclerosis  on Crestor, Mild disease noted Cholesterol close to goal    Total encounter time more than 30 minutes  Greater than 50% was spent in counseling and coordination of care with the patient    Signed, Esmond Plants, M.D., Ph.D. [place], [place]

## 2022-04-04 ENCOUNTER — Ambulatory Visit: Payer: Medicare Other | Admitting: Cardiovascular Disease

## 2022-04-04 ENCOUNTER — Encounter: Payer: Self-pay | Admitting: Cardiovascular Disease

## 2022-04-04 VITALS — BP 110/70 | HR 58 | Ht 68.0 in | Wt 163.0 lb

## 2022-04-04 DIAGNOSIS — I493 Ventricular premature depolarization: Secondary | ICD-10-CM

## 2022-04-04 DIAGNOSIS — E782 Mixed hyperlipidemia: Secondary | ICD-10-CM

## 2022-04-04 DIAGNOSIS — I10 Essential (primary) hypertension: Secondary | ICD-10-CM | POA: Diagnosis not present

## 2022-04-04 DIAGNOSIS — I491 Atrial premature depolarization: Secondary | ICD-10-CM

## 2022-04-04 DIAGNOSIS — Z7901 Long term (current) use of anticoagulants: Secondary | ICD-10-CM | POA: Diagnosis not present

## 2022-04-04 DIAGNOSIS — I480 Paroxysmal atrial fibrillation: Secondary | ICD-10-CM

## 2022-04-04 MED ORDER — DILTIAZEM HCL ER COATED BEADS 120 MG PO CP24: 120.0000 mg | ORAL_CAPSULE | Freq: Every day | ORAL | 3 refills | Status: DC

## 2022-04-04 MED ORDER — METOPROLOL SUCCINATE ER 50 MG PO TB24: ORAL_TABLET | ORAL | 3 refills | Status: DC

## 2022-04-04 NOTE — Patient Instructions (Signed)
Medication Instructions:  No changes  If you need a refill on your cardiac medications before your next appointment, please call your pharmacy.   Lab work: No new labs needed  Testing/Procedures: No new testing needed  Follow-Up: At [facility], you and your health needs are our priority.  As part of our continuing mission to provide you with exceptional heart care, we have created designated Provider Care Teams.  These Care Teams include your primary Cardiologist (physician) and Advanced Practice Providers (APPs -  Physician Assistants and Nurse Practitioners) who all work together to provide you with the care you need, when you need it.  You will need a follow up appointment in 12 months  Providers on your designated Care Team:   Christopher Berge, NP Ryan Dunn, PA-C Cadence Furth, PA-C  COVID-19 Vaccine Information can be found at: [email or web address] For questions related to vaccine distribution or appointments, please email [email or web address] or call [phone].   

## 2022-04-14 ENCOUNTER — Ambulatory Visit
Admission: RE | Admit: 2022-04-14 | Discharge: 2022-04-14 | Disposition: A | Discharge status: Home / Self Care | Payer: Medicare Other | Source: Ambulatory Visit | Attending: Internal Medicine | Admitting: Internal Medicine

## 2022-04-14 DIAGNOSIS — Z1231 Encounter for screening mammogram for malignant neoplasm of breast: Secondary | ICD-10-CM | POA: Diagnosis not present

## 2022-06-09 ENCOUNTER — Other Ambulatory Visit: Payer: Self-pay | Admitting: Internal Medicine

## 2022-06-17 ENCOUNTER — Other Ambulatory Visit: Payer: Self-pay | Admitting: Cardiovascular Disease

## 2022-06-23 ENCOUNTER — Ambulatory Visit (INDEPENDENT_AMBULATORY_CARE_PROVIDER_SITE_OTHER): Payer: Medicare Other | Admitting: Internal Medicine

## 2022-06-23 ENCOUNTER — Encounter: Payer: Self-pay | Admitting: Internal Medicine

## 2022-06-23 DIAGNOSIS — I10 Essential (primary) hypertension: Secondary | ICD-10-CM

## 2022-06-23 DIAGNOSIS — R739 Hyperglycemia, unspecified: Secondary | ICD-10-CM

## 2022-06-23 DIAGNOSIS — K219 Gastro-esophageal reflux disease without esophagitis: Secondary | ICD-10-CM

## 2022-06-23 DIAGNOSIS — I480 Paroxysmal atrial fibrillation: Secondary | ICD-10-CM | POA: Diagnosis not present

## 2022-06-23 DIAGNOSIS — M810 Age-related osteoporosis without current pathological fracture: Secondary | ICD-10-CM

## 2022-06-23 NOTE — Progress Notes (Signed)
Patient ID: Allison [name], female   DOB: [birth year], 71 y.o.   MRN: [id number]   Subjective:    Patient ID: Allison [name], female    DOB: 04-02-1951, 71 y.o.   MRN: [id number]   Patient here for  Chief Complaint  Patient presents with   Follow-up    3 mon, denies any concerns or unusual pain.   Marland Kitchen   HPI Here to follow up regarding afib, GERD and increased stress.  No longer working.  Handling this ok. Trying to stay active.  No chest pain.  Breathing stable.  No acid reflux.  No abdominal pain.  Bowels moving.  Saw Dr Rockey Situ - 03/2022 - recommended continuing diltiazem and metoprolol.  Overall stable.     Past Medical History:  Diagnosis Date   A-fib [facility])    GERD (gastroesophageal reflux disease)    Hypercholesterolemia    Hypertension    Migraine headache    Psoriasis    Past Surgical History:  Procedure Laterality Date   COLONOSCOPY WITH PROPOFOL N/A 12/23/2016   Procedure: COLONOSCOPY WITH PROPOFOL;  Surgeon: Lollie Sails, MD;  Location: [facility] ENDOSCOPY;  Service: Endoscopy;  Laterality: N/A;   DILATION AND CURETTAGE OF UTERUS     history of abnormal bleeding   TUBAL LIGATION     Family History  Problem Relation Age of Onset   Asthma Father    Diabetes Mother    CVA Mother    Psoriasis Mother    Breast cancer Maternal Grandmother    Breast cancer Paternal Grandmother    Colon cancer Maternal Uncle    Colon cancer Other        nephew   Diabetes Other        multiple relatives (both sides)   Stroke Brother        Light stroke   Diabetes Brother    Social History   Socioeconomic History   Marital status: Single    Spouse name: Not on file   Number of children: 1   Years of education: Not on file   Highest education level: Not on file  Occupational History   Not on file  Tobacco Use   Smoking status: Never   Smokeless tobacco: Never  Vaping Use   Vaping Use: Never used  Substance and Sexual Activity   Alcohol use: No    Alcohol/week: 0.0  standard drinks of alcohol   Drug use: No   Sexual activity: Not on file  Other Topics Concern   Not on file  Social History Narrative   Not on file   Social Determinants of Health   Financial Resource Strain: Low Risk  (02/10/2022)   Overall Financial Resource Strain (CARDIA)    Difficulty of Paying Living Expenses: Not hard at all  Food Insecurity: No Food Insecurity (02/10/2022)   Hunger Vital Sign    Worried About Running Out of Food in the Last Year: Never true    Ran Out of Food in the Last Year: Never true  Transportation Needs: No Transportation Needs (02/10/2022)   PRAPARE - Hydrologist (Medical): No    Lack of Transportation (Non-Medical): No  Physical Activity: Sufficiently Active (02/10/2022)   Exercise Vital Sign    Days of Exercise per Week: 5 days    Minutes of Exercise per Session: 30 min  Stress: No Stress Concern Present (02/10/2022)   [place]    Feeling of  Stress : Not at all  Social Connections: Unknown (02/10/2022)   Social Connection and Isolation Panel [NHANES]    Frequency of Communication with Friends and Family: More than three times a week    Frequency of Social Gatherings with Friends and Family: More than three times a week    Attends Religious Services: Not on Advertising copywriter or Organizations: Not on file    Attends Archivist Meetings: Not on file    Marital Status: Not on file     Review of Systems  Constitutional:  Negative for appetite change and unexpected weight change.  HENT:  Negative for congestion and sinus pressure.   Respiratory:  Negative for cough, chest tightness and shortness of breath.   Cardiovascular:  Negative for chest pain, palpitations and leg swelling.  Gastrointestinal:  Negative for abdominal pain, diarrhea, nausea and vomiting.  Genitourinary:  Negative for difficulty urinating and dysuria.   Musculoskeletal:  Negative for joint swelling and myalgias.  Skin:  Negative for color change and rash.  Neurological:  Negative for dizziness, light-headedness and headaches.  Psychiatric/Behavioral:  Negative for agitation and dysphoric mood.        Objective:     BP 130/84 (BP Location: Left Arm, Patient Position: Sitting, Cuff Size: Normal)   Pulse 61   Temp 98.1 F (36.7 C) (Oral)   Resp 14   Ht _0  (1.727 m)   Wt 164 lb (74.4 kg)   SpO2 97%   BMI 24.94 kg/m  Wt Readings from Last 3 Encounters:  06/23/22 164 lb (74.4 kg)  04/04/22 163 lb (73.9 kg)  03/23/22 162 lb (73.5 kg)    Physical Exam Vitals reviewed.  Constitutional:      General: She is not in acute distress.    Appearance: Normal appearance.  HENT:     Head: Normocephalic and atraumatic.     Right Ear: External ear normal.     Left Ear: External ear normal.  Eyes:     General: No scleral icterus.       Right eye: No discharge.        Left eye: No discharge.     Conjunctiva/sclera: Conjunctivae normal.  Neck:     Thyroid: No thyromegaly.  Cardiovascular:     Rate and Rhythm: Normal rate and regular rhythm.  Pulmonary:     Effort: No respiratory distress.     Breath sounds: Normal breath sounds. No wheezing.  Abdominal:     General: Bowel sounds are normal.     Palpations: Abdomen is soft.     Tenderness: There is no abdominal tenderness.  Musculoskeletal:        General: No swelling or tenderness.     Cervical back: Neck supple. No tenderness.  Lymphadenopathy:     Cervical: No cervical adenopathy.  Skin:    Findings: No erythema or rash.  Neurological:     Mental Status: She is alert.  Psychiatric:        Mood and Affect: Mood normal.        Behavior: Behavior normal.      Outpatient Encounter Medications as of 06/23/2022  Medication Sig   alendronate (FOSAMAX) 70 MG tablet Take 1 tablet (70 mg total) by mouth every 7 (seven) days. Take with a full glass of water on an empty  stomach.   ALPRAZolam (XANAX) 0.25 MG tablet TAKE 1 TABLET BY MOUTH ONCE DAILY AS NEEDED FOR ANXIETY   Calcium Carb-Cholecalciferol (  CALCIUM 500/VITAMIN D PO) Take 2,000 Units by mouth daily.   diltiazem (CARDIZEM CD) 120 MG 24 hr capsule Take 1 capsule (120 mg total) by mouth daily.   diltiazem (CARDIZEM) 30 MG tablet TAKE 1 TABLET BY MOUTH THREE TIMES DAILY AS NEEDED FOR  TACHYCARDIA/  ATRIAL  FIB   hyoscyamine (LEVSIN) 0.125 MG tablet TAKE 1 TABLET BY MOUTH EVERY 6 HOURS AS NEEDED   metoprolol succinate (TOPROL-XL) 50 MG 24 hr tablet TAKE 1 & 1/2 (ONE & ONE-HALF) TABLETS BY MOUTH ONCE DAILY WITH OR IMMEDIATELY FOLLOWING A MEAL   omeprazole (PRILOSEC) 20 MG capsule Take 1 capsule by mouth once daily   potassium chloride (KLOR-CON) 10 MEQ tablet Take 1 tablet by mouth once daily   rivaroxaban (XARELTO) 20 MG TABS tablet TAKE 1 TABLET BY MOUTH ONCE DAILY WITH SUPPER   rosuvastatin (CRESTOR) 10 MG tablet Take 1 tablet by mouth once daily   No facility-administered encounter medications on file as of 06/23/2022.     Lab Results  Component Value Date   WBC 6.1 03/23/2022   HGB 14.3 03/23/2022   HCT 43.1 03/23/2022   PLT 229.0 03/23/2022   GLUCOSE 95 03/23/2022   CHOL 161 03/23/2022   TRIG 87.0 03/23/2022   HDL 65.00 03/23/2022   LDLCALC 78 03/23/2022   ALT 15 03/23/2022   AST 23 03/23/2022   NA 142 03/23/2022   K 4.1 03/23/2022   CL 104 03/23/2022   CREATININE 0.78 03/23/2022   BUN 16 03/23/2022   CO2 29 03/23/2022   TSH 0.98 10/05/2021   INR 2.3 (H) 12/13/2020   HGBA1C 5.9 03/23/2022    MM 3D SCREEN BREAST BILATERAL  Result Date: 04/15/2022 CLINICAL DATA:  Screening. EXAM: DIGITAL SCREENING BILATERAL MAMMOGRAM WITH TOMOSYNTHESIS AND CAD TECHNIQUE: Bilateral screening digital craniocaudal and mediolateral oblique mammograms were obtained. Bilateral screening digital breast tomosynthesis was performed. The images were evaluated with computer-aided detection. COMPARISON:  Previous  exam(s). ACR Breast Density Category b: There are scattered areas of fibroglandular density. FINDINGS: There are no findings suspicious for malignancy. IMPRESSION: No mammographic evidence of malignancy. A result letter of this screening mammogram will be mailed directly to the patient. RECOMMENDATION: Screening mammogram in one year. (Code:SM-B-01Y) BI-RADS CATEGORY  1: Negative. Electronically Signed   By: Audie Pinto M.D.   On: 04/15/2022 12:33      Assessment & Plan:   Problem List Items Addressed This Visit     GERD (gastroesophageal reflux disease)    Controlled on prilosec.       Hyperglycemia    Continue low carb diet and exercise.  Follow met b and a1c.       Relevant Orders   Hemoglobin A1c   Hypertension    Continue metoprolol and cardizem. Blood pressure doing well.  Follow pressures.  Follow met b.       Relevant Orders   Basic metabolic panel   Hepatic function panel   Lipid panel   TSH   Osteoporosis    On fosamax.  Follow       Paroxysmal atrial fibrillation (HCC)    Continue xarelto.  Continue cardizem CD 155m q day and toprol 778mq day.  Appears to be doing well on these medications.  Has diltiazem 3072mrn to take if needed.        Relevant Orders   Basic metabolic panel   TSH     ChaEinar PheasantD

## 2022-07-03 ENCOUNTER — Encounter: Payer: Self-pay | Admitting: Internal Medicine

## 2022-07-03 NOTE — Assessment & Plan Note (Signed)
Continue low carb diet and exercise.  Follow met b and a1c.  

## 2022-07-03 NOTE — Assessment & Plan Note (Signed)
On fosamax.  Follow  

## 2022-07-03 NOTE — Assessment & Plan Note (Signed)
Continue xarelto.  Continue cardizem CD 120mg q day and toprol 75mg q day.  Appears to be doing well on these medications.  Has diltiazem 30mg prn to take if needed.   

## 2022-07-03 NOTE — Assessment & Plan Note (Signed)
Continue metoprolol and cardizem. Blood pressure doing well.  Follow pressures.  Follow met b.  

## 2022-07-03 NOTE — Assessment & Plan Note (Signed)
Controlled on prilosec.   

## 2022-07-05 ENCOUNTER — Other Ambulatory Visit: Payer: Self-pay | Admitting: Internal Medicine

## 2022-07-05 DIAGNOSIS — K219 Gastro-esophageal reflux disease without esophagitis: Secondary | ICD-10-CM | POA: Diagnosis not present

## 2022-07-05 DIAGNOSIS — K5730 Diverticulosis of large intestine without perforation or abscess without bleeding: Secondary | ICD-10-CM | POA: Diagnosis not present

## 2022-07-15 ENCOUNTER — Other Ambulatory Visit (INDEPENDENT_AMBULATORY_CARE_PROVIDER_SITE_OTHER): Payer: Medicare Other

## 2022-07-15 DIAGNOSIS — I480 Paroxysmal atrial fibrillation: Secondary | ICD-10-CM | POA: Diagnosis not present

## 2022-07-15 DIAGNOSIS — R739 Hyperglycemia, unspecified: Secondary | ICD-10-CM

## 2022-07-15 DIAGNOSIS — I10 Essential (primary) hypertension: Secondary | ICD-10-CM | POA: Diagnosis not present

## 2022-07-15 LAB — HEPATIC FUNCTION PANEL
ALT: 18 U/L (ref 0–35)
AST: 22 U/L (ref 0–37)
Albumin: 3.9 g/dL (ref 3.5–5.2)
Alkaline Phosphatase: 60 U/L (ref 39–117)
Bilirubin, Direct: 0.1 mg/dL (ref 0.0–0.3)
Total Bilirubin: 0.4 mg/dL (ref 0.2–1.2)
Total Protein: 6.5 g/dL (ref 6.0–8.3)

## 2022-07-15 LAB — BASIC METABOLIC PANEL
BUN: 13 mg/dL (ref 6–23)
CO2: 30 mEq/L (ref 19–32)
Calcium: 9.1 mg/dL (ref 8.4–10.5)
Chloride: 104 mEq/L (ref 96–112)
Creatinine, Ser: 0.77 mg/dL (ref 0.40–1.20)
GFR: 78.46 mL/min (ref >60.00)
Glucose, Bld: 84 mg/dL (ref 70–99)
Potassium: 4.4 mEq/L (ref 3.5–5.1)
Sodium: 142 mEq/L (ref 135–145)

## 2022-07-15 LAB — LIPID PANEL
Cholesterol: 158 mg/dL (ref 0–200)
HDL: 54.9 mg/dL (ref >39.00)
LDL Cholesterol: 80 mg/dL (ref 0–99)
NonHDL: 102.99
Total CHOL/HDL Ratio: 3
Triglycerides: 116 mg/dL (ref 0.0–149.0)
VLDL: 23.2 mg/dL (ref 0.0–40.0)

## 2022-07-15 LAB — TSH: TSH: 2.04 u[IU]/mL (ref 0.35–5.50)

## 2022-07-18 LAB — HEMOGLOBIN A1C: Hgb A1c MFr Bld: 5.9 % (ref 4.6–6.5)

## 2022-08-08 DIAGNOSIS — L578 Other skin changes due to chronic exposure to nonionizing radiation: Secondary | ICD-10-CM | POA: Diagnosis not present

## 2022-08-08 DIAGNOSIS — Z86018 Personal history of other benign neoplasm: Secondary | ICD-10-CM | POA: Diagnosis not present

## 2022-08-08 DIAGNOSIS — L821 Other seborrheic keratosis: Secondary | ICD-10-CM | POA: Diagnosis not present

## 2022-08-08 DIAGNOSIS — Z872 Personal history of diseases of the skin and subcutaneous tissue: Secondary | ICD-10-CM | POA: Diagnosis not present

## 2022-08-09 ENCOUNTER — Other Ambulatory Visit: Payer: Self-pay | Admitting: Cardiovascular Disease

## 2022-08-09 DIAGNOSIS — I480 Paroxysmal atrial fibrillation: Secondary | ICD-10-CM

## 2022-08-09 NOTE — Telephone Encounter (Signed)
Refill request

## 2022-08-09 NOTE — Telephone Encounter (Signed)
Xarelto '20mg'$  refill request received. Pt is 71 years old, weight-74.4kg, Crea-0.77 on 07/15/2022, last seen by Dr. Rockey Situ on 04/04/2022, Diagnosis-Afib, CrCl-80.99 mL/min; Dose is appropriate based on dosing criteria. Will send in refill to requested pharmacy.

## 2022-08-10 ENCOUNTER — Other Ambulatory Visit: Payer: Self-pay | Admitting: Family

## 2022-09-20 ENCOUNTER — Telehealth: Payer: Self-pay

## 2022-09-20 NOTE — Telephone Encounter (Signed)
Patient states she just got her flu shot today at [facility] on [address].

## 2022-09-21 NOTE — Telephone Encounter (Signed)
documented

## 2022-09-25 ENCOUNTER — Other Ambulatory Visit: Payer: Self-pay | Admitting: *Deleted

## 2022-09-25 MED ORDER — ALPRAZOLAM 0.25 MG PO TABS: 0.2500 mg | ORAL_TABLET | Freq: Every day | ORAL | 0 refills | Status: DC | PRN

## 2022-09-25 NOTE — Telephone Encounter (Signed)
Last filled by [facility] on 06/09/22  LOV: 06/23/22 NOV: 10/27/22

## 2022-09-25 NOTE — Telephone Encounter (Signed)
Rx ok'd for xanax #30 with no refills.  ?

## 2022-10-20 ENCOUNTER — Other Ambulatory Visit: Payer: Self-pay | Admitting: Internal Medicine

## 2022-10-25 ENCOUNTER — Ambulatory Visit: Payer: Medicare Other | Admitting: Internal Medicine

## 2022-10-27 ENCOUNTER — Ambulatory Visit (INDEPENDENT_AMBULATORY_CARE_PROVIDER_SITE_OTHER): Payer: Medicare HMO | Admitting: Internal Medicine

## 2022-10-27 ENCOUNTER — Telehealth: Payer: Self-pay | Admitting: Internal Medicine

## 2022-10-27 ENCOUNTER — Encounter: Payer: Self-pay | Admitting: Internal Medicine

## 2022-10-27 VITALS — BP 118/78 | HR 98 | Temp 98.1°F | Ht 68.0 in | Wt 168.0 lb

## 2022-10-27 DIAGNOSIS — R739 Hyperglycemia, unspecified: Secondary | ICD-10-CM | POA: Diagnosis not present

## 2022-10-27 DIAGNOSIS — I480 Paroxysmal atrial fibrillation: Secondary | ICD-10-CM | POA: Diagnosis not present

## 2022-10-27 DIAGNOSIS — E7800 Pure hypercholesterolemia, unspecified: Secondary | ICD-10-CM | POA: Diagnosis not present

## 2022-10-27 DIAGNOSIS — K5790 Diverticulosis of intestine, part unspecified, without perforation or abscess without bleeding: Secondary | ICD-10-CM | POA: Diagnosis not present

## 2022-10-27 DIAGNOSIS — M810 Age-related osteoporosis without current pathological fracture: Secondary | ICD-10-CM

## 2022-10-27 DIAGNOSIS — K219 Gastro-esophageal reflux disease without esophagitis: Secondary | ICD-10-CM

## 2022-10-27 DIAGNOSIS — I10 Essential (primary) hypertension: Secondary | ICD-10-CM | POA: Diagnosis not present

## 2022-10-27 MED ORDER — ROSUVASTATIN CALCIUM 10 MG PO TABS: 10.0000 mg | ORAL_TABLET | Freq: Every day | ORAL | 1 refills | Status: DC

## 2022-10-27 NOTE — Progress Notes (Signed)
Patient ID: Allison [name], female   DOB: 1951/03/01, 72 y.o.   MRN: [id number]   Subjective:    Patient ID: Allison [name], female    DOB: February 03, 1951, 72 y.o.   MRN: [id number]   Patient here for  Chief Complaint  Patient presents with   Medical Management of Chronic Issues    4 month follow up    .   HPI Here to follow up regarding afib, GERD and increased stress.  No longer working.  Handling this well.  Trying to stay active.  No chest pain.  Breathing stable.  No acid reflux.  No abdominal pain.  Bowels moving.  Saw Dr Rockey Situ - 03/2022 - recommended continuing diltiazem and metoprolol.  Overall stable.  Saw GI 07/05/22 - recommended 10 year f/u colonoscopy.  Continue PPI.    Past Medical History:  Diagnosis Date   A-fib [facility])    GERD (gastroesophageal reflux disease)    Hypercholesterolemia    Hypertension    Migraine headache    Psoriasis    Past Surgical History:  Procedure Laterality Date   COLONOSCOPY WITH PROPOFOL N/A 12/23/2016   Procedure: COLONOSCOPY WITH PROPOFOL;  Surgeon: Lollie Sails, MD;  Location: [facility] ENDOSCOPY;  Service: Endoscopy;  Laterality: N/A;   DILATION AND CURETTAGE OF UTERUS     history of abnormal bleeding   TUBAL LIGATION     Family History  Problem Relation Age of Onset   Asthma Father    Diabetes Mother    CVA Mother    Psoriasis Mother    Breast cancer Maternal Grandmother    Breast cancer Paternal Grandmother    Colon cancer Maternal Uncle    Colon cancer Other        nephew   Diabetes Other        multiple relatives (both sides)   Stroke Brother        Light stroke   Diabetes Brother    Social History   Socioeconomic History   Marital status: Single    Spouse name: Not on file   Number of children: 1   Years of education: Not on file   Highest education level: Not on file  Occupational History   Not on file  Tobacco Use   Smoking status: Never   Smokeless tobacco: Never  Vaping Use   Vaping Use: Never used   Substance and Sexual Activity   Alcohol use: No    Alcohol/week: 0.0 standard drinks of alcohol   Drug use: No   Sexual activity: Not on file  Other Topics Concern   Not on file  Social History Narrative   Not on file   Social Determinants of Health   Financial Resource Strain: Low Risk  (02/10/2022)   Overall Financial Resource Strain (CARDIA)    Difficulty of Paying Living Expenses: Not hard at all  Food Insecurity: No Food Insecurity (02/10/2022)   Hunger Vital Sign    Worried About Running Out of Food in the Last Year: Never true    Ran Out of Food in the Last Year: Never true  Transportation Needs: No Transportation Needs (02/10/2022)   PRAPARE - Hydrologist (Medical): No    Lack of Transportation (Non-Medical): No  Physical Activity: Sufficiently Active (02/10/2022)   Exercise Vital Sign    Days of Exercise per Week: 5 days    Minutes of Exercise per Session: 30 min  Stress: No Stress Concern Present (02/10/2022)  [employer] [place] Questionnaire    Feeling of Stress : Not at all  Social Connections: Unknown (02/10/2022)   Social Connection and Isolation Panel [NHANES]    Frequency of Communication with Friends and Family: More than three times a week    Frequency of Social Gatherings with Friends and Family: More than three times a week    Attends Religious Services: Not on Advertising copywriter or Organizations: Not on file    Attends Archivist Meetings: Not on file    Marital Status: Not on file     Review of Systems  Constitutional:  Negative for appetite change and unexpected weight change.  HENT:  Negative for congestion and sinus pressure.   Respiratory:  Negative for cough, chest tightness and shortness of breath.   Cardiovascular:  Negative for chest pain, palpitations and leg swelling.  Gastrointestinal:  Negative for abdominal pain, diarrhea, nausea and vomiting.   Genitourinary:  Negative for difficulty urinating and dysuria.  Musculoskeletal:  Negative for joint swelling and myalgias.  Skin:  Negative for color change and rash.  Neurological:  Negative for dizziness, light-headedness and headaches.  Psychiatric/Behavioral:  Negative for agitation and dysphoric mood.        Objective:     BP 118/78   Pulse 98   Temp 98.1 F (36.7 C) (Oral)   Ht '5\' 8"'$  (1.727 m)   Wt 168 lb (76.2 kg)   SpO2 98%   BMI 25.54 kg/m  Wt Readings from Last 3 Encounters:  10/27/22 168 lb (76.2 kg)  06/23/22 164 lb (74.4 kg)  04/04/22 163 lb (73.9 kg)    Physical Exam Vitals reviewed.  Constitutional:      General: She is not in acute distress.    Appearance: Normal appearance.  HENT:     Head: Normocephalic and atraumatic.     Right Ear: External ear normal.     Left Ear: External ear normal.  Eyes:     General: No scleral icterus.       Right eye: No discharge.        Left eye: No discharge.     Conjunctiva/sclera: Conjunctivae normal.  Neck:     Thyroid: No thyromegaly.  Cardiovascular:     Rate and Rhythm: Normal rate and regular rhythm.  Pulmonary:     Effort: No respiratory distress.     Breath sounds: Normal breath sounds. No wheezing.  Abdominal:     General: Bowel sounds are normal.     Palpations: Abdomen is soft.     Tenderness: There is no abdominal tenderness.  Musculoskeletal:        General: No swelling or tenderness.     Cervical back: Neck supple. No tenderness.  Lymphadenopathy:     Cervical: No cervical adenopathy.  Skin:    Findings: No erythema or rash.  Neurological:     Mental Status: She is alert.  Psychiatric:        Mood and Affect: Mood normal.        Behavior: Behavior normal.      Outpatient Encounter Medications as of 10/27/2022  Medication Sig   alendronate (FOSAMAX) 70 MG tablet TAKE 1 TABLET BY MOUTH ONCE A WEEK. TAKE ON AN EMPTY STOMACH WITH A FULL GLASS OF WATER   ALPRAZolam (XANAX) 0.25 MG tablet  Take 1 tablet (0.25 mg total) by mouth daily as needed for anxiety.   Calcium Carb-Cholecalciferol (CALCIUM 500/VITAMIN  D PO) Take 2,000 Units by mouth daily.   diltiazem (CARDIZEM CD) 120 MG 24 hr capsule Take 1 capsule (120 mg total) by mouth daily.   diltiazem (CARDIZEM) 30 MG tablet TAKE 1 TABLET BY MOUTH THREE TIMES DAILY AS NEEDED FOR  TACHYCARDIA/  ATRIAL  FIB   hyoscyamine (LEVSIN) 0.125 MG tablet TAKE 1 TABLET BY MOUTH EVERY 6 HOURS AS NEEDED   metoprolol succinate (TOPROL-XL) 50 MG 24 hr tablet TAKE 1 & 1/2 (ONE & ONE-HALF) TABLETS BY MOUTH ONCE DAILY WITH OR IMMEDIATELY FOLLOWING A MEAL   omeprazole (PRILOSEC) 20 MG capsule Take 1 capsule by mouth once daily   potassium chloride (KLOR-CON) 10 MEQ tablet Take 1 tablet by mouth once daily   rivaroxaban (XARELTO) 20 MG TABS tablet TAKE 1 TABLET BY MOUTH ONCE DAILY WITH SUPPER   rosuvastatin (CRESTOR) 10 MG tablet Take 1 tablet (10 mg total) by mouth daily.   [DISCONTINUED] rosuvastatin (CRESTOR) 10 MG tablet Take 1 tablet by mouth once daily   No facility-administered encounter medications on file as of 10/27/2022.     Lab Results  Component Value Date   WBC 6.1 03/23/2022   HGB 14.3 03/23/2022   HCT 43.1 03/23/2022   PLT 229.0 03/23/2022   GLUCOSE 84 07/15/2022   CHOL 158 07/15/2022   TRIG 116.0 07/15/2022   HDL 54.90 07/15/2022   LDLCALC 80 07/15/2022   ALT 18 07/15/2022   AST 22 07/15/2022   NA 142 07/15/2022   K 4.4 07/15/2022   CL 104 07/15/2022   CREATININE 0.77 07/15/2022   BUN 13 07/15/2022   CO2 30 07/15/2022   TSH 2.04 07/15/2022   INR 2.3 (H) 12/13/2020   HGBA1C 5.9 07/15/2022    MM 3D SCREEN BREAST BILATERAL  Result Date: 04/15/2022 CLINICAL DATA:  Screening. EXAM: DIGITAL SCREENING BILATERAL MAMMOGRAM WITH TOMOSYNTHESIS AND CAD TECHNIQUE: Bilateral screening digital craniocaudal and mediolateral oblique mammograms were obtained. Bilateral screening digital breast tomosynthesis was performed. The images were  evaluated with computer-aided detection. COMPARISON:  Previous exam(s). ACR Breast Density Category b: There are scattered areas of fibroglandular density. FINDINGS: There are no findings suspicious for malignancy. IMPRESSION: No mammographic evidence of malignancy. A result letter of this screening mammogram will be mailed directly to the patient. RECOMMENDATION: Screening mammogram in one year. (Code:SM-B-01Y) BI-RADS CATEGORY  1: Negative. Electronically Signed   By: Audie Pinto M.D.   On: 04/15/2022 12:33      Assessment & Plan:   Problem List Items Addressed This Visit     Diverticulosis    Had colonoscopy 12/2016.  Stable.        GERD (gastroesophageal reflux disease)    Controlled on prilosec.       Hypercholesterolemia    On Crestor.  Low cholesterol diet and exercise.  Follow lipid panel and liver function tests.        Relevant Medications   rosuvastatin (CRESTOR) 10 MG tablet   Other Relevant Orders   Lipid Profile   Hepatic function panel   Hyperglycemia    Continue low carb diet and exercise.  Follow met b and a1c.       Relevant Orders   HgB A1c   Hypertension - Primary    Continue metoprolol and cardizem. Blood pressure doing well.  Follow pressures.  Follow met b.       Relevant Medications   rosuvastatin (CRESTOR) 10 MG tablet   Other Relevant Orders   Basic Metabolic Panel (BMET)   Osteoporosis  On fosamax.  Follow       Paroxysmal atrial fibrillation (HCC)    Continue xarelto.  Continue cardizem CD '120mg'$  q day and toprol '75mg'$  q day.  Appears to be doing well on these medications.  Has diltiazem '30mg'$  prn to take if needed.        Relevant Medications   rosuvastatin (CRESTOR) 10 MG tablet     Einar Pheasant, MD

## 2022-10-27 NOTE — Telephone Encounter (Signed)
Patient was seen today. She wanted to let Dr Nicki Reaper know she went home and found her ALPRAZolam (XANAX) 0.25 MG tablet.

## 2022-10-29 ENCOUNTER — Encounter: Payer: Self-pay | Admitting: Internal Medicine

## 2022-10-29 NOTE — Assessment & Plan Note (Signed)
Had colonoscopy 12/2016.  Stable.

## 2022-10-29 NOTE — Assessment & Plan Note (Signed)
On Crestor.  Low cholesterol diet and exercise.  Follow lipid panel and liver function tests.

## 2022-10-29 NOTE — Assessment & Plan Note (Signed)
Continue metoprolol and cardizem. Blood pressure doing well.  Follow pressures.  Follow met b.  °

## 2022-10-29 NOTE — Assessment & Plan Note (Signed)
Continue low carb diet and exercise.  Follow met b and a1c.  

## 2022-10-29 NOTE — Assessment & Plan Note (Signed)
On fosamax.  Follow

## 2022-10-29 NOTE — Assessment & Plan Note (Signed)
Continue xarelto.  Continue cardizem CD '120mg'$  q day and toprol '75mg'$  q day.  Appears to be doing well on these medications.  Has diltiazem '30mg'$  prn to take if needed.

## 2022-10-29 NOTE — Assessment & Plan Note (Signed)
Controlled on prilosec.   

## 2022-10-31 ENCOUNTER — Telehealth: Payer: Self-pay | Admitting: Internal Medicine

## 2022-10-31 NOTE — Telephone Encounter (Signed)
Pt called staying that she would like for Dr. Nicki Reaper to know that she got her Covid shot today at [facility], and for this to go in her chart.

## 2022-11-01 NOTE — Telephone Encounter (Signed)
Chart updated

## 2022-11-13 ENCOUNTER — Other Ambulatory Visit: Payer: Self-pay | Admitting: Internal Medicine

## 2022-11-21 ENCOUNTER — Other Ambulatory Visit: Payer: Self-pay | Admitting: Internal Medicine

## 2022-11-24 ENCOUNTER — Other Ambulatory Visit (INDEPENDENT_AMBULATORY_CARE_PROVIDER_SITE_OTHER): Payer: Medicare HMO

## 2022-11-24 DIAGNOSIS — E7800 Pure hypercholesterolemia, unspecified: Secondary | ICD-10-CM

## 2022-11-24 DIAGNOSIS — I10 Essential (primary) hypertension: Secondary | ICD-10-CM

## 2022-11-24 DIAGNOSIS — R739 Hyperglycemia, unspecified: Secondary | ICD-10-CM | POA: Diagnosis not present

## 2022-11-24 LAB — BASIC METABOLIC PANEL
BUN: 15 mg/dL (ref 6–23)
CO2: 33 mEq/L — ABNORMAL HIGH (ref 19–32)
Calcium: 9.5 mg/dL (ref 8.4–10.5)
Chloride: 101 mEq/L (ref 96–112)
Creatinine, Ser: 0.73 mg/dL (ref 0.40–1.20)
GFR: 83.43 mL/min (ref >60.00)
Glucose, Bld: 94 mg/dL (ref 70–99)
Potassium: 4.6 mEq/L (ref 3.5–5.1)
Sodium: 139 mEq/L (ref 135–145)

## 2022-11-24 LAB — HEPATIC FUNCTION PANEL
ALT: 16 U/L (ref 0–35)
AST: 21 U/L (ref 0–37)
Albumin: 4.2 g/dL (ref 3.5–5.2)
Alkaline Phosphatase: 57 U/L (ref 39–117)
Bilirubin, Direct: 0.1 mg/dL (ref 0.0–0.3)
Total Bilirubin: 0.5 mg/dL (ref 0.2–1.2)
Total Protein: 6.6 g/dL (ref 6.0–8.3)

## 2022-11-24 LAB — LIPID PANEL
Cholesterol: 176 mg/dL (ref 0–200)
HDL: 57.1 mg/dL (ref >39.00)
LDL Cholesterol: 97 mg/dL (ref 0–99)
NonHDL: 118.61
Total CHOL/HDL Ratio: 3
Triglycerides: 109 mg/dL (ref 0.0–149.0)
VLDL: 21.8 mg/dL (ref 0.0–40.0)

## 2022-11-24 LAB — HEMOGLOBIN A1C: Hgb A1c MFr Bld: 5.8 % (ref 4.6–6.5)

## 2022-11-25 ENCOUNTER — Encounter: Payer: Self-pay | Admitting: *Deleted

## 2022-11-29 ENCOUNTER — Other Ambulatory Visit: Payer: Self-pay | Admitting: Internal Medicine

## 2022-12-08 ENCOUNTER — Other Ambulatory Visit: Payer: Self-pay | Admitting: Internal Medicine

## 2022-12-12 ENCOUNTER — Telehealth: Payer: Self-pay | Admitting: Internal Medicine

## 2022-12-12 NOTE — Telephone Encounter (Signed)
Patient dropped off document to be filled out by provider  insurance paperwork . Patient requested to send it via Call Patient to pick up within 7-days. Document is located in providers tray at front office.

## 2022-12-14 NOTE — Telephone Encounter (Signed)
Patient returned office phone call, note read and appointment scheduled. Patient will come and pick up paper work

## 2022-12-14 NOTE — Telephone Encounter (Signed)
LMTCB  1 of the forms she dropped off has been completed by Dr Nicki Reaper but patient will need to pick up so she can sign and returned by 2/29 per instructions on the paper. Placed up front for pick up  The other form she dropped off will require a visit to complete. Please schedule her an appointment when she returns call for [place] to complete form during visit.

## 2022-12-14 NOTE — Telephone Encounter (Signed)
noted 

## 2022-12-22 ENCOUNTER — Encounter: Payer: Self-pay | Admitting: Internal Medicine

## 2022-12-22 ENCOUNTER — Telehealth (INDEPENDENT_AMBULATORY_CARE_PROVIDER_SITE_OTHER): Payer: Medicare HMO | Admitting: Internal Medicine

## 2022-12-22 VITALS — Ht 68.0 in | Wt 168.0 lb

## 2022-12-22 DIAGNOSIS — K219 Gastro-esophageal reflux disease without esophagitis: Secondary | ICD-10-CM | POA: Diagnosis not present

## 2022-12-22 DIAGNOSIS — R195 Other fecal abnormalities: Secondary | ICD-10-CM | POA: Diagnosis not present

## 2022-12-22 DIAGNOSIS — E7800 Pure hypercholesterolemia, unspecified: Secondary | ICD-10-CM | POA: Diagnosis not present

## 2022-12-22 DIAGNOSIS — I10 Essential (primary) hypertension: Secondary | ICD-10-CM

## 2022-12-22 DIAGNOSIS — Z1231 Encounter for screening mammogram for malignant neoplasm of breast: Secondary | ICD-10-CM | POA: Diagnosis not present

## 2022-12-22 DIAGNOSIS — I480 Paroxysmal atrial fibrillation: Secondary | ICD-10-CM | POA: Diagnosis not present

## 2022-12-22 DIAGNOSIS — R739 Hyperglycemia, unspecified: Secondary | ICD-10-CM

## 2022-12-22 NOTE — Progress Notes (Deleted)
Subjective:    Patient ID: Allison [name], female    DOB: 05/26/1951, 72 y.o.   MRN: [id number]  Patient here for No chief complaint on file.   HPI Here for form completion.   History of afib, GERD and increased stress.  Saw Dr Rockey Situ - 03/2022 - recommended continuing diltiazem and metoprolol.  Saw GI 07/05/22 - recommended 10 year f/u colonoscopy.  Continue PPI.   Past Medical History:  Diagnosis Date   A-fib [facility])    GERD (gastroesophageal reflux disease)    Hypercholesterolemia    Hypertension    Migraine headache    Psoriasis    Past Surgical History:  Procedure Laterality Date   COLONOSCOPY WITH PROPOFOL N/A 12/23/2016   Procedure: COLONOSCOPY WITH PROPOFOL;  Surgeon: Lollie Sails, MD;  Location: [facility] ENDOSCOPY;  Service: Endoscopy;  Laterality: N/A;   DILATION AND CURETTAGE OF UTERUS     history of abnormal bleeding   TUBAL LIGATION     Family History  Problem Relation Age of Onset   Asthma Father    Diabetes Mother    CVA Mother    Psoriasis Mother    Breast cancer Maternal Grandmother    Breast cancer Paternal Grandmother    Colon cancer Maternal Uncle    Colon cancer Other        nephew   Diabetes Other        multiple relatives (both sides)   Stroke Brother        Light stroke   Diabetes Brother    Social History   Socioeconomic History   Marital status: Single    Spouse name: Not on file   Number of children: 1   Years of education: Not on file   Highest education level: Not on file  Occupational History   Not on file  Tobacco Use   Smoking status: Never   Smokeless tobacco: Never  Vaping Use   Vaping Use: Never used  Substance and Sexual Activity   Alcohol use: No    Alcohol/week: 0.0 standard drinks of alcohol   Drug use: No   Sexual activity: Not on file  Other Topics Concern   Not on file  Social History Narrative   Not on file   Social Determinants of Health   Financial Resource Strain: Low Risk  (02/10/2022)   Overall  Financial Resource Strain (CARDIA)    Difficulty of Paying Living Expenses: Not hard at all  Food Insecurity: No Food Insecurity (02/10/2022)   Hunger Vital Sign    Worried About Running Out of Food in the Last Year: Never true    Ran Out of Food in the Last Year: Never true  Transportation Needs: No Transportation Needs (02/10/2022)   PRAPARE - Hydrologist (Medical): No    Lack of Transportation (Non-Medical): No  Physical Activity: Sufficiently Active (02/10/2022)   Exercise Vital Sign    Days of Exercise per Week: 5 days    Minutes of Exercise per Session: 30 min  Stress: No Stress Concern Present (02/10/2022)   [place]    Feeling of Stress : Not at all  Social Connections: Unknown (02/10/2022)   Social Connection and Isolation Panel [NHANES]    Frequency of Communication with Friends and Family: More than three times a week    Frequency of Social Gatherings with Friends and Family: More than three times a week    Attends Religious  Services: Not on file    Active Member of Clubs or Organizations: Not on file    Attends Club or Organization Meetings: Not on file    Marital Status: Not on file     Review of Systems     Objective:     There were no vitals taken for this visit. Wt Readings from Last 3 Encounters:  10/27/22 168 lb (76.2 kg)  06/23/22 164 lb (74.4 kg)  04/04/22 163 lb (73.9 kg)    Physical Exam   Outpatient Encounter Medications as of 12/22/2022  Medication Sig   alendronate (FOSAMAX) 70 MG tablet TAKE 1 TABLET BY MOUTH ONCE A WEEK ON AN EMPTY STOMACH WITH A FULL GLASS OF WATER.   ALPRAZolam (XANAX) 0.25 MG tablet TAKE 1 TABLET BY MOUTH ONCE DAILY AS NEEDED FOR ANXIETY   Calcium Carb-Cholecalciferol (CALCIUM 500/VITAMIN D PO) Take 2,000 Units by mouth daily.   diltiazem (CARDIZEM CD) 120 MG 24 hr capsule Take 1 capsule (120 mg total) by mouth daily.   diltiazem  (CARDIZEM) 30 MG tablet TAKE 1 TABLET BY MOUTH THREE TIMES DAILY AS NEEDED FOR  TACHYCARDIA/  ATRIAL  FIB   hyoscyamine (LEVSIN) 0.125 MG tablet TAKE 1 TABLET BY MOUTH EVERY 6 HOURS AS NEEDED   metoprolol succinate (TOPROL-XL) 50 MG 24 hr tablet TAKE 1 & 1/2 (ONE & ONE-HALF) TABLETS BY MOUTH ONCE DAILY WITH OR IMMEDIATELY FOLLOWING A MEAL   omeprazole (PRILOSEC) 20 MG capsule Take 1 capsule by mouth once daily   potassium chloride (KLOR-CON) 10 MEQ tablet Take 1 tablet by mouth once daily   rivaroxaban (XARELTO) 20 MG TABS tablet TAKE 1 TABLET BY MOUTH ONCE DAILY WITH SUPPER   rosuvastatin (CRESTOR) 10 MG tablet Take 1 tablet (10 mg total) by mouth daily.   No facility-administered encounter medications on file as of 12/22/2022.     Lab Results  Component Value Date   WBC 6.1 03/23/2022   HGB 14.3 03/23/2022   HCT 43.1 03/23/2022   PLT 229.0 03/23/2022   GLUCOSE 94 11/24/2022   CHOL 176 11/24/2022   TRIG 109.0 11/24/2022   HDL 57.10 11/24/2022   LDLCALC 97 11/24/2022   ALT 16 11/24/2022   AST 21 11/24/2022   NA 139 11/24/2022   K 4.6 11/24/2022   CL 101 11/24/2022   CREATININE 0.73 11/24/2022   BUN 15 11/24/2022   CO2 33 (H) 11/24/2022   TSH 2.04 07/15/2022   INR 2.3 (H) 12/13/2020   HGBA1C 5.8 11/24/2022    MM 3D SCREEN BREAST BILATERAL  Result Date: 04/15/2022 CLINICAL DATA:  Screening. EXAM: DIGITAL SCREENING BILATERAL MAMMOGRAM WITH TOMOSYNTHESIS AND CAD TECHNIQUE: Bilateral screening digital craniocaudal and mediolateral oblique mammograms were obtained. Bilateral screening digital breast tomosynthesis was performed. The images were evaluated with computer-aided detection. COMPARISON:  Previous exam(s). ACR Breast Density Category b: There are scattered areas of fibroglandular density. FINDINGS: There are no findings suspicious for malignancy. IMPRESSION: No mammographic evidence of malignancy. A result letter of this screening mammogram will be mailed directly to the  patient. RECOMMENDATION: Screening mammogram in one year. (Code:SM-B-01Y) BI-RADS CATEGORY  1: Negative. Electronically Signed   By: Audie Pinto M.D.   On: 04/15/2022 12:33      Assessment & Plan:  There are no diagnoses linked to this encounter.   Einar Pheasant, MD

## 2022-12-22 NOTE — Patient Instructions (Signed)
Your Medicare Annual Wellness visit will be due after 02/11/2023. Please call the office to schedule this.

## 2022-12-22 NOTE — Progress Notes (Signed)
Patient ID: Allison [name], female   DOB: [birth year], 72 y.o.   MRN: [id number]   Virtual Visit via video/telephone Note  All issues noted in this document were discussed and addressed.  No physical exam was performed (except for noted visual exam findings with Video Visits).   I connected with Allison [name] by a video enabled telemedicine application or telephone and verified that I am speaking with the correct person using two identifiers. Location patient: home Location provider: work  Persons participating in the virtual/telephone visit: patient, provider  The limitations, risks, security and privacy concerns of performing an evaluation and management service by telephone and the availability of in person appointments have been discussed.  It has also been discussed with the patient that there may be a patient responsible charge related to this service. The patient expressed understanding and agreed to proceed.  Attempted to contact - via video.  Unable to connect.  Visit continued - phone only.   Reason for visit: work in appt.   HPI: Work in appt - to discuss form completion.  Discussed current medications and confirmed compliance.  No chest pain or sob reported.  No increased cough or congestion.  She did report noticed a few days ago - diarrhea.  Lasted for a couple of days.  Felt drained - initially.  Feeling better now.  Ate yesterday.  Yogurt.  No abdominal pain.     ROS: See pertinent positives and negatives per HPI.  Past Medical History:  Diagnosis Date   A-fib [facility])    GERD (gastroesophageal reflux disease)    Hypercholesterolemia    Hypertension    Migraine headache    Psoriasis     Past Surgical History:  Procedure Laterality Date   COLONOSCOPY WITH PROPOFOL N/A 12/23/2016   Procedure: COLONOSCOPY WITH PROPOFOL;  Surgeon: Lollie Sails, MD;  Location: [facility] ENDOSCOPY;  Service: Endoscopy;  Laterality: N/A;   DILATION AND CURETTAGE OF UTERUS     history  of abnormal bleeding   TUBAL LIGATION      Family History  Problem Relation Age of Onset   Asthma Father    Diabetes Mother    CVA Mother    Psoriasis Mother    Breast cancer Maternal Grandmother    Breast cancer Paternal Grandmother    Colon cancer Maternal Uncle    Colon cancer Other        nephew   Diabetes Other        multiple relatives (both sides)   Stroke Brother        Light stroke   Diabetes Brother     SOCIAL HX: reviewed.    Current Outpatient Medications:    alendronate (FOSAMAX) 70 MG tablet, TAKE 1 TABLET BY MOUTH ONCE A WEEK ON AN EMPTY STOMACH WITH A FULL GLASS OF WATER., Disp: 12 tablet, Rfl: 0   ALPRAZolam (XANAX) 0.25 MG tablet, TAKE 1 TABLET BY MOUTH ONCE DAILY AS NEEDED FOR ANXIETY, Disp: 30 tablet, Rfl: 0   Calcium Carb-Cholecalciferol (CALCIUM 500/VITAMIN D PO), Take 2,000 Units by mouth daily., Disp: , Rfl:    diltiazem (CARDIZEM CD) 120 MG 24 hr capsule, Take 1 capsule (120 mg total) by mouth daily., Disp: 90 capsule, Rfl: 3   diltiazem (CARDIZEM) 30 MG tablet, TAKE 1 TABLET BY MOUTH THREE TIMES DAILY AS NEEDED FOR  TACHYCARDIA/  ATRIAL  FIB, Disp: 90 tablet, Rfl: 3   hyoscyamine (LEVSIN) 0.125 MG tablet, TAKE 1 TABLET BY MOUTH EVERY 6 HOURS AS  NEEDED, Disp: 30 tablet, Rfl: 0   metoprolol succinate (TOPROL-XL) 50 MG 24 hr tablet, TAKE 1 & 1/2 (ONE & ONE-HALF) TABLETS BY MOUTH ONCE DAILY WITH OR IMMEDIATELY FOLLOWING A MEAL, Disp: 135 tablet, Rfl: 3   omeprazole (PRILOSEC) 20 MG capsule, Take 1 capsule by mouth once daily, Disp: 90 capsule, Rfl: 1   potassium chloride (KLOR-CON) 10 MEQ tablet, Take 1 tablet by mouth once daily, Disp: 90 tablet, Rfl: 2   rivaroxaban (XARELTO) 20 MG TABS tablet, TAKE 1 TABLET BY MOUTH ONCE DAILY WITH SUPPER, Disp: 90 tablet, Rfl: 1   rosuvastatin (CRESTOR) 10 MG tablet, Take 1 tablet (10 mg total) by mouth daily., Disp: 90 tablet, Rfl: 1  EXAM:  GENERAL: alert.  Answering questions appropriately.  Sounds to be in no  acute distress.    PSYCH/NEURO: pleasant and cooperative, no obvious depression or anxiety, speech and thought processing grossly intact  ASSESSMENT AND PLAN:  Discussed the following assessment and plan:  Problem List Items Addressed This Visit     Paroxysmal atrial fibrillation ([place])    Continue xarelto.  Continue cardizem CD '120mg'$  q day and toprol '75mg'$  q day.  Appears to be doing well on these medications.  Has diltiazem '30mg'$  prn to take if needed.        Loose stools    Loose stools as outlined.  Better now.  Eating yogurt.  Probiotics.  Follow.  Call with update.      Hypertension    Continue metoprolol and cardizem. Blood pressure doing well.  Follow pressures.  Follow met b.       Hyperglycemia    Continue low carb diet and exercise.  Follow met b and a1c.       Hypercholesterolemia    On Crestor.  Low cholesterol diet and exercise.  Follow lipid panel and liver function tests.        GERD (gastroesophageal reflux disease)    Controlled on prilosec.       Other Visit Diagnoses     Visit for screening mammogram    -  Primary   Relevant Orders   MM 3D SCREEN BREAST BILATERAL       Return if symptoms worsen or fail to improve, for keep scheduled.   I discussed the assessment and treatment plan with the patient. The patient was provided an opportunity to ask questions and all were answered. The patient agreed with the plan and demonstrated an understanding of the instructions.   The patient was advised to call back or seek an in-person evaluation if the symptoms worsen or if the condition fails to improve as anticipated.  I provided 25 minutes of non-face-to-face time during this encounter.   Einar Pheasant, MD

## 2022-12-26 NOTE — Telephone Encounter (Signed)
Patient picked up paperwork.

## 2022-12-31 ENCOUNTER — Encounter: Payer: Self-pay | Admitting: Internal Medicine

## 2022-12-31 NOTE — Assessment & Plan Note (Signed)
Continue low carb diet and exercise.  Follow met b and a1c.  

## 2022-12-31 NOTE — Assessment & Plan Note (Signed)
Controlled on prilosec.   

## 2022-12-31 NOTE — Assessment & Plan Note (Signed)
Continue metoprolol and cardizem. Blood pressure doing well.  Follow pressures.  Follow met b.  °

## 2022-12-31 NOTE — Assessment & Plan Note (Signed)
On Crestor.  Low cholesterol diet and exercise.  Follow lipid panel and liver function tests.   

## 2022-12-31 NOTE — Assessment & Plan Note (Signed)
Loose stools as outlined.  Better now.  Eating yogurt.  Probiotics.  Follow.  Call with update.

## 2022-12-31 NOTE — Assessment & Plan Note (Signed)
Continue xarelto.  Continue cardizem CD 120mg q day and toprol 75mg q day.  Appears to be doing well on these medications.  Has diltiazem 30mg prn to take if needed.   

## 2023-01-25 ENCOUNTER — Other Ambulatory Visit: Payer: Self-pay | Admitting: Internal Medicine

## 2023-01-25 NOTE — Telephone Encounter (Signed)
Last refill 11/30/22 Next OV 04/2023

## 2023-01-28 ENCOUNTER — Other Ambulatory Visit: Payer: Self-pay | Admitting: Cardiovascular Disease

## 2023-01-28 DIAGNOSIS — I480 Paroxysmal atrial fibrillation: Secondary | ICD-10-CM

## 2023-01-30 NOTE — Telephone Encounter (Signed)
Prescription refill request for Xarelto received.   Indication: afib  Last office visit: [place], 04/04/2022 Weight: 76.2 kg  Age: 72 yo  Scr: 0.73, 11/24/2022 CrCl: 86 ml/min   Refill sent.

## 2023-01-30 NOTE — Telephone Encounter (Signed)
Refill request

## 2023-02-13 ENCOUNTER — Telehealth: Payer: Self-pay | Admitting: Internal Medicine

## 2023-02-13 NOTE — Telephone Encounter (Signed)
Contacted Allison [name] to schedule their annual wellness visit. Appointment made for 02/20/2023.  Thank you,  Allison [name] Support [name] Direct dial  [phone]

## 2023-02-16 ENCOUNTER — Encounter: Payer: Self-pay | Admitting: Internal Medicine

## 2023-02-16 ENCOUNTER — Ambulatory Visit (INDEPENDENT_AMBULATORY_CARE_PROVIDER_SITE_OTHER): Payer: Medicare HMO | Admitting: Internal Medicine

## 2023-02-16 VITALS — BP 126/80 | HR 70 | Temp 97.9°F | Ht 68.0 in | Wt 169.6 lb

## 2023-02-16 DIAGNOSIS — I480 Paroxysmal atrial fibrillation: Secondary | ICD-10-CM | POA: Diagnosis not present

## 2023-02-16 DIAGNOSIS — I10 Essential (primary) hypertension: Secondary | ICD-10-CM | POA: Diagnosis not present

## 2023-02-16 DIAGNOSIS — R221 Localized swelling, mass and lump, neck: Secondary | ICD-10-CM | POA: Diagnosis not present

## 2023-02-16 LAB — CBC WITH DIFFERENTIAL/PLATELET
Basophils Absolute: 0.1 10*3/uL (ref 0.0–0.1)
Basophils Relative: 0.9 % (ref 0.0–3.0)
Eosinophils Absolute: 0.2 10*3/uL (ref 0.0–0.7)
Eosinophils Relative: 2.8 % (ref 0.0–5.0)
HCT: 41.4 % (ref 36.0–46.0)
Hemoglobin: 14.1 g/dL (ref 12.0–15.0)
Lymphocytes Relative: 24.2 % (ref 12.0–46.0)
Lymphs Abs: 1.4 10*3/uL (ref 0.7–4.0)
MCHC: 34.1 g/dL (ref 30.0–36.0)
MCV: 94.6 fl (ref 78.0–100.0)
Monocytes Absolute: 0.6 10*3/uL (ref 0.1–1.0)
Monocytes Relative: 9.3 % (ref 3.0–12.0)
Neutro Abs: 3.7 10*3/uL (ref 1.4–7.7)
Neutrophils Relative %: 62.8 % (ref 43.0–77.0)
Platelets: 254 10*3/uL (ref 150.0–400.0)
RBC: 4.38 Mil/uL (ref 3.87–5.11)
RDW: 12.8 % (ref 11.5–15.5)
WBC: 5.9 10*3/uL (ref 4.0–10.5)

## 2023-02-16 LAB — BASIC METABOLIC PANEL
BUN: 16 mg/dL (ref 6–23)
CO2: 29 mEq/L (ref 19–32)
Calcium: 9 mg/dL (ref 8.4–10.5)
Chloride: 103 mEq/L (ref 96–112)
Creatinine, Ser: 0.71 mg/dL (ref 0.40–1.20)
GFR: 86.12 mL/min (ref >60.00)
Glucose, Bld: 116 mg/dL — ABNORMAL HIGH (ref 70–99)
Potassium: 4.1 mEq/L (ref 3.5–5.1)
Sodium: 140 mEq/L (ref 135–145)

## 2023-02-16 LAB — TSH: TSH: 0.72 u[IU]/mL (ref 0.35–5.50)

## 2023-02-16 MED ORDER — ROSUVASTATIN CALCIUM 10 MG PO TABS: 10.0000 mg | ORAL_TABLET | Freq: Every day | ORAL | 1 refills | Status: DC

## 2023-02-16 MED ORDER — ALENDRONATE SODIUM 70 MG PO TABS: ORAL_TABLET | ORAL | 0 refills | Status: DC

## 2023-02-16 NOTE — Progress Notes (Signed)
Subjective:    Patient ID: Allison [name], female    DOB: [birth year], 72 y.o.   MRN: [id number]  Patient here for  Chief Complaint  Patient presents with   Mass    On right side of neck    HPI Work in appt. -for increased "mass" noted - right side of neck.  She reports noticed after her last visit.  Had a virtual visit 11/2022.  No pain.  No change in size.  No sinus pressure or sore throat.  No cough or congestion.  No chest pain or sob.  No problems swallowing.  No other lumps/knots noticed.     Past Medical History:  Diagnosis Date   A-fib [facility])    GERD (gastroesophageal reflux disease)    Hypercholesterolemia    Hypertension    Migraine headache    Psoriasis    Past Surgical History:  Procedure Laterality Date   COLONOSCOPY WITH PROPOFOL N/A 12/23/2016   Procedure: COLONOSCOPY WITH PROPOFOL;  Surgeon: Christena Deem, MD;  Location: [facility] ENDOSCOPY;  Service: Endoscopy;  Laterality: N/A;   DILATION AND CURETTAGE OF UTERUS     history of abnormal bleeding   TUBAL LIGATION     Family History  Problem Relation Age of Onset   Asthma Father    Diabetes Mother    CVA Mother    Psoriasis Mother    Breast cancer Maternal Grandmother    Breast cancer Paternal Grandmother    Colon cancer Maternal Uncle    Colon cancer Other        nephew   Diabetes Other        multiple relatives (both sides)   Stroke Brother        Light stroke   Diabetes Brother    Social History   Socioeconomic History   Marital status: Single    Spouse name: Not on file   Number of children: 1   Years of education: Not on file   Highest education level: Not on file  Occupational History   Not on file  Tobacco Use   Smoking status: Never   Smokeless tobacco: Never  Vaping Use   Vaping Use: Never used  Substance and Sexual Activity   Alcohol use: No    Alcohol/week: 0.0 standard drinks of alcohol   Drug use: No   Sexual activity: Not on file  Other Topics Concern   Not on file   Social History Narrative   Not on file   Social Determinants of Health   Financial Resource Strain: Low Risk  (02/10/2022)   Overall Financial Resource Strain (CARDIA)    Difficulty of Paying Living Expenses: Not hard at all  Food Insecurity: No Food Insecurity (02/10/2022)   Hunger Vital Sign    Worried About Running Out of Food in the Last Year: Never true    Ran Out of Food in the Last Year: Never true  Transportation Needs: No Transportation Needs (02/10/2022)   PRAPARE - Administrator, Civil Service (Medical): No    Lack of Transportation (Non-Medical): No  Physical Activity: Sufficiently Active (02/10/2022)   Exercise Vital Sign    Days of Exercise per Week: 5 days    Minutes of Exercise per Session: 30 min  Stress: No Stress Concern Present (02/10/2022)   [employer] of Occupational Health - Occupational Stress Questionnaire    Feeling of Stress : Not at all  Social Connections: Unknown (02/10/2022)   Social Connection and Isolation Panel [  NHANES]    Frequency of Communication with Friends and Family: More than three times a week    Frequency of Social Gatherings with Friends and Family: More than three times a week    Attends Religious Services: Not on Marketing executive or Organizations: Not on file    Attends Banker Meetings: Not on file    Marital Status: Not on file     Review of Systems  Constitutional:  Negative for appetite change and unexpected weight change.  HENT:  Negative for congestion and sinus pressure.   Respiratory:  Negative for cough, chest tightness and shortness of breath.   Cardiovascular:  Negative for chest pain and palpitations.  Gastrointestinal:  Negative for abdominal pain, diarrhea, nausea and vomiting.  Genitourinary:  Negative for difficulty urinating and dysuria.  Musculoskeletal:  Negative for joint swelling and myalgias.  Skin:  Negative for color change and rash.  Neurological:  Negative for  dizziness and headaches.  Psychiatric/Behavioral:  Negative for agitation and dysphoric mood.        Objective:     BP 126/80   Pulse 70   Temp 97.9 F (36.6 C) (Oral)   Ht 5\' 8"  (1.727 m)   Wt 169 lb 9.6 oz (76.9 kg)   SpO2 97%   BMI 25.79 kg/m  Wt Readings from Last 3 Encounters:  02/16/23 169 lb 9.6 oz (76.9 kg)  12/22/22 168 lb (76.2 kg)  10/27/22 168 lb (76.2 kg)    Physical Exam Vitals reviewed.  Constitutional:      General: She is not in acute distress.    Appearance: Normal appearance.  HENT:     Head: Normocephalic and atraumatic.     Right Ear: External ear normal.     Left Ear: External ear normal.  Eyes:     General: No scleral icterus.       Right eye: No discharge.        Left eye: No discharge.     Conjunctiva/sclera: Conjunctivae normal.  Neck:     Thyroid: No thyromegaly.     Comments: Increased fullness - right neck.  Question - thyroid - noted movement with swallowing.  No pain.  No other lymphadenopathy appreciated.  Cardiovascular:     Rate and Rhythm: Normal rate and regular rhythm.  Pulmonary:     Effort: No respiratory distress.     Breath sounds: Normal breath sounds. No wheezing.  Abdominal:     General: Bowel sounds are normal.     Palpations: Abdomen is soft.     Tenderness: There is no abdominal tenderness.  Musculoskeletal:        General: No swelling or tenderness.     Cervical back: Neck supple. No tenderness.  Lymphadenopathy:     Cervical: No cervical adenopathy.  Skin:    Findings: No erythema or rash.  Neurological:     Mental Status: She is alert.  Psychiatric:        Mood and Affect: Mood normal.        Behavior: Behavior normal.      Outpatient Encounter Medications as of 02/16/2023  Medication Sig   ALPRAZolam (XANAX) 0.25 MG tablet TAKE 1 TABLET BY MOUTH ONCE DAILY AS NEEDED FOR ANXIETY   Calcium Carb-Cholecalciferol (CALCIUM 500/VITAMIN D PO) Take 2,000 Units by mouth daily.   diltiazem (CARDIZEM CD) 120 MG  24 hr capsule Take 1 capsule (120 mg total) by mouth daily.   diltiazem (CARDIZEM)  30 MG tablet TAKE 1 TABLET BY MOUTH THREE TIMES DAILY AS NEEDED FOR  TACHYCARDIA/  ATRIAL  FIB   hyoscyamine (LEVSIN) 0.125 MG tablet TAKE 1 TABLET BY MOUTH EVERY 6 HOURS AS NEEDED   metoprolol succinate (TOPROL-XL) 50 MG 24 hr tablet TAKE 1 & 1/2 (ONE & ONE-HALF) TABLETS BY MOUTH ONCE DAILY WITH OR IMMEDIATELY FOLLOWING A MEAL   omeprazole (PRILOSEC) 20 MG capsule Take 1 capsule by mouth once daily   potassium chloride (KLOR-CON) 10 MEQ tablet Take 1 tablet by mouth once daily   XARELTO 20 MG TABS tablet TAKE 1 TABLET BY MOUTH ONCE DAILY WITH SUPPER   [DISCONTINUED] alendronate (FOSAMAX) 70 MG tablet TAKE 1 TABLET BY MOUTH ONCE A WEEK ON AN EMPTY STOMACH WITH A FULL GLASS OF WATER.   [DISCONTINUED] rosuvastatin (CRESTOR) 10 MG tablet Take 1 tablet (10 mg total) by mouth daily.   alendronate (FOSAMAX) 70 MG tablet TAKE 1 TABLET BY MOUTH ONCE A WEEK ON AN EMPTY STOMACH WITH A FULL GLASS OF WATER.   rosuvastatin (CRESTOR) 10 MG tablet Take 1 tablet (10 mg total) by mouth daily.   No facility-administered encounter medications on file as of 02/16/2023.     Lab Results  Component Value Date   WBC 5.9 02/16/2023   HGB 14.1 02/16/2023   HCT 41.4 02/16/2023   PLT 254.0 02/16/2023   GLUCOSE 116 (H) 02/16/2023   CHOL 176 11/24/2022   TRIG 109.0 11/24/2022   HDL 57.10 11/24/2022   LDLCALC 97 11/24/2022   ALT 16 11/24/2022   AST 21 11/24/2022   NA 140 02/16/2023   K 4.1 02/16/2023   CL 103 02/16/2023   CREATININE 0.71 02/16/2023   BUN 16 02/16/2023   CO2 29 02/16/2023   TSH 0.72 02/16/2023   INR 2.3 (H) 12/13/2020   HGBA1C 5.8 11/24/2022    MM 3D SCREEN BREAST BILATERAL  Result Date: 04/15/2022 CLINICAL DATA:  Screening. EXAM: DIGITAL SCREENING BILATERAL MAMMOGRAM WITH TOMOSYNTHESIS AND CAD TECHNIQUE: Bilateral screening digital craniocaudal and mediolateral oblique mammograms were obtained. Bilateral  screening digital breast tomosynthesis was performed. The images were evaluated with computer-aided detection. COMPARISON:  Previous exam(s). ACR Breast Density Category b: There are scattered areas of fibroglandular density. FINDINGS: There are no findings suspicious for malignancy. IMPRESSION: No mammographic evidence of malignancy. A result letter of this screening mammogram will be mailed directly to the patient. RECOMMENDATION: Screening mammogram in one year. (Code:SM-B-01Y) BI-RADS CATEGORY  1: Negative. Electronically Signed   By: Emmaline Kluver M.D.   On: 04/15/2022 12:33      Assessment & Plan:  Neck mass Assessment & Plan: Neck fullness as outlined.  Given persistence, check CT neck to further evaluate.    Orders: -     TSH -     CBC with Differential/Platelet -     Basic metabolic panel -     CT SOFT TISSUE NECK WO CONTRAST; Future  Paroxysmal atrial fibrillation [facility]) Assessment & Plan: Continue xarelto.  Continue cardizem CD 120mg  q day and toprol 75mg  q day.  Appears to be doing well on these medications.  Has diltiazem 30mg  prn to take if needed.  Reports stable.    Primary hypertension Assessment & Plan: Continue metoprolol and cardizem. Blood pressure doing well.  Follow pressures.  Follow met b.    Other orders -     Alendronate Sodium; TAKE 1 TABLET BY MOUTH ONCE A WEEK ON AN EMPTY STOMACH WITH A FULL GLASS OF WATER.  Dispense: 12 tablet; Refill: 0 -     Rosuvastatin Calcium; Take 1 tablet (10 mg total) by mouth daily.  Dispense: 90 tablet; Refill: 1     [place], MD

## 2023-02-19 ENCOUNTER — Encounter: Payer: Self-pay | Admitting: Internal Medicine

## 2023-02-19 NOTE — Assessment & Plan Note (Signed)
Continue metoprolol and cardizem. Blood pressure doing well.  Follow pressures.  Follow met b.  °

## 2023-02-19 NOTE — Assessment & Plan Note (Signed)
Continue xarelto.  Continue cardizem CD 120mg  q day and toprol 75mg  q day.  Appears to be doing well on these medications.  Has diltiazem 30mg  prn to take if needed.  Reports stable.

## 2023-02-19 NOTE — Assessment & Plan Note (Signed)
Neck fullness as outlined.  Given persistence, check CT neck to further evaluate.

## 2023-02-20 ENCOUNTER — Ambulatory Visit (INDEPENDENT_AMBULATORY_CARE_PROVIDER_SITE_OTHER): Payer: Medicare HMO

## 2023-02-20 VITALS — Ht 68.0 in | Wt 169.0 lb

## 2023-02-20 DIAGNOSIS — Z0000 Encounter for general adult medical examination without abnormal findings: Secondary | ICD-10-CM

## 2023-02-20 NOTE — Progress Notes (Signed)
Subjective:   Allison [name] is a 72 y.o. female who presents for Medicare Annual (Subsequent) preventive examination.  Review of Systems    No ROS.  Medicare Wellness Virtual Visit.  Visual/audio telehealth visit, UTA vital signs.   See social history for additional risk factors.   Cardiac Risk Factors include: advanced age (>28men, >71 women)     Objective:    Today's Vitals   02/20/23 1035  Weight: 169 lb (76.7 kg)  Height: 5\' 8"  (1.727 m)   Body mass index is 25.7 kg/m.     02/20/2023   10:43 AM 02/10/2022   11:43 AM 12/13/2020    8:03 PM 07/26/2020    2:26 AM 06/25/2020   12:43 PM 10/15/2019    5:39 AM 02/16/2019    7:56 PM  Advanced Directives  Does Patient Have a Medical Advance Directive? Yes Yes No;Yes Yes Yes Yes No  Type of Estate agent of [place];Living will Healthcare Power of [employer] Power of [employer] Power of [employer] Power of [place];Living will Healthcare Power of Attorney   Does patient want to make changes to medical advance directive? No - Patient declined No - Patient declined  No - Patient declined No - Patient declined No - Patient declined   Copy of Healthcare Power of Attorney in Chart? Yes - validated most recent copy scanned in chart (See row information) No - copy requested No - copy requested No - copy requested Yes - validated most recent copy scanned in chart (See row information) No - copy requested     Current Medications (verified) Outpatient Encounter Medications as of 02/20/2023  Medication Sig   alendronate (FOSAMAX) 70 MG tablet TAKE 1 TABLET BY MOUTH ONCE A WEEK ON AN EMPTY STOMACH WITH A FULL GLASS OF WATER.   ALPRAZolam (XANAX) 0.25 MG tablet TAKE 1 TABLET BY MOUTH ONCE DAILY AS NEEDED FOR ANXIETY   Calcium Carb-Cholecalciferol (CALCIUM 500/VITAMIN D PO) Take 2,000 Units by mouth daily.   diltiazem (CARDIZEM CD) 120 MG 24 hr capsule Take 1 capsule (120 mg total) by mouth  daily.   diltiazem (CARDIZEM) 30 MG tablet TAKE 1 TABLET BY MOUTH THREE TIMES DAILY AS NEEDED FOR  TACHYCARDIA/  ATRIAL  FIB   hyoscyamine (LEVSIN) 0.125 MG tablet TAKE 1 TABLET BY MOUTH EVERY 6 HOURS AS NEEDED   metoprolol succinate (TOPROL-XL) 50 MG 24 hr tablet TAKE 1 & 1/2 (ONE & ONE-HALF) TABLETS BY MOUTH ONCE DAILY WITH OR IMMEDIATELY FOLLOWING A MEAL   omeprazole (PRILOSEC) 20 MG capsule Take 1 capsule by mouth once daily   potassium chloride (KLOR-CON) 10 MEQ tablet Take 1 tablet by mouth once daily   rosuvastatin (CRESTOR) 10 MG tablet Take 1 tablet (10 mg total) by mouth daily.   XARELTO 20 MG TABS tablet TAKE 1 TABLET BY MOUTH ONCE DAILY WITH SUPPER   No facility-administered encounter medications on file as of 02/20/2023.    Allergies (verified) Iodine   History: Past Medical History:  Diagnosis Date   A-fib (HCC)    GERD (gastroesophageal reflux disease)    Hypercholesterolemia    Hypertension    Migraine headache    Psoriasis    Past Surgical History:  Procedure Laterality Date   COLONOSCOPY WITH PROPOFOL N/A 12/23/2016   Procedure: COLONOSCOPY WITH PROPOFOL;  Surgeon: Christena Deem, MD;  Location: [facility] ENDOSCOPY;  Service: Endoscopy;  Laterality: N/A;   DILATION AND CURETTAGE OF UTERUS     history of abnormal  bleeding   TUBAL LIGATION     Family History  Problem Relation Age of Onset   Asthma Father    Diabetes Mother    CVA Mother    Psoriasis Mother    Breast cancer Maternal Grandmother    Breast cancer Paternal Grandmother    Colon cancer Maternal Uncle    Colon cancer Other        nephew   Diabetes Other        multiple relatives (both sides)   Stroke Brother        Light stroke   Diabetes Brother    Social History   Socioeconomic History   Marital status: Single    Spouse name: Not on file   Number of children: 1   Years of education: Not on file   Highest education level: Not on file  Occupational History   Not on file  Tobacco Use    Smoking status: Never   Smokeless tobacco: Never  Vaping Use   Vaping Use: Never used  Substance and Sexual Activity   Alcohol use: No    Alcohol/week: 0.0 standard drinks of alcohol   Drug use: No   Sexual activity: Not on file  Other Topics Concern   Not on file  Social History Narrative   Not on file   Social Determinants of Health   Financial Resource Strain: Low Risk  (02/20/2023)   Overall Financial Resource Strain (CARDIA)    Difficulty of Paying Living Expenses: Not hard at all  Food Insecurity: No Food Insecurity (02/20/2023)   Hunger Vital Sign    Worried About Running Out of Food in the Last Year: Never true    Ran Out of Food in the Last Year: Never true  Transportation Needs: No Transportation Needs (02/20/2023)   PRAPARE - Administrator, Civil Service (Medical): No    Lack of Transportation (Non-Medical): No  Physical Activity: Sufficiently Active (02/20/2023)   Exercise Vital Sign    Days of Exercise per Week: 5 days    Minutes of Exercise per Session: 30 min  Stress: No Stress Concern Present (02/20/2023)   [employer] of Occupational Health - Occupational Stress Questionnaire    Feeling of Stress : Not at all  Social Connections: Unknown (02/20/2023)   Social Connection and Isolation Panel [NHANES]    Frequency of Communication with Friends and Family: More than three times a week    Frequency of Social Gatherings with Friends and Family: More than three times a week    Attends Religious Services: Not on Marketing executive or Organizations: Not on file    Attends Banker Meetings: Not on file    Marital Status: Not on file    Tobacco Counseling Counseling given: Not Answered   Clinical Intake:  Pre-visit preparation completed: Yes        Diabetes: No  How often do you need to have someone help you when you read instructions, pamphlets, or other written materials from your doctor or pharmacy?: 1 -  Never    Interpreter Needed?: No      Activities of Daily Living    02/20/2023   10:41 AM  In your present state of health, do you have any difficulty performing the following activities:  Hearing? 0  Vision? 0  Difficulty concentrating or making decisions? 0  Walking or climbing stairs? 0  Dressing or bathing? 0  Doing errands, shopping? 0  Preparing  Food and eating ? N  Using the Toilet? N  In the past six months, have you accidently leaked urine? N  Do you have problems with loss of bowel control? N  Managing your Medications? N  Managing your Finances? N  Housekeeping or managing your Housekeeping? N    Patient Care Team: Dale [place], MD as PCP - General (Internal Medicine) Antonieta Iba, MD as PCP - Cardiology (Cardiology)  Indicate any recent Medical Services you may have received from other than [facility] providers in the past year (date may be approximate).     Assessment:   This is a routine wellness examination for Allison [name].  I connected with  Allison [name] on 02/20/23 by a audio enabled telemedicine application and verified that I am speaking with the correct person using two identifiers.  Patient Location: Home  Provider Location: Home Office  I discussed the limitations of evaluation and management by telemedicine. The patient expressed understanding and agreed to proceed.   Hearing/Vision screen Hearing Screening - Comments:: Patient is able to hear conversational tones without difficulty. No issues reported. Vision Screening - Comments:: Wears reader lenses. They have seen their ophthalmologist in the last 12 months.  Dietary issues and exercise activities discussed: Current Exercise Habits: Home exercise routine, Type of exercise: walking, Intensity: Mild   Goals Addressed               This Visit's Progress     Patient Stated     Increase physical activity (pt-stated)   On track     I would like to walk more for exercise.        Depression Screen    02/20/2023   10:38 AM 02/16/2023   11:31 AM 10/27/2022   11:49 AM 06/23/2022    3:10 PM 03/23/2022    8:36 AM 02/10/2022   11:42 AM 09/28/2020    2:49 PM  PHQ 2/9 Scores  PHQ - 2 Score 0 0 0 0 0 0 0    Fall Risk    02/20/2023   10:40 AM 02/16/2023   11:31 AM 10/27/2022   11:49 AM 06/23/2022    3:10 PM 03/23/2022    8:36 AM  Fall Risk   Falls in the past year? 0 0 0 0 0  Number falls in past yr: 0 0  0   Injury with Fall? 0 0  0   Risk for fall due to :  No Fall Risks No Fall Risks No Fall Risks No Fall Risks  Follow up Falls evaluation completed;Falls prevention discussed Falls evaluation completed Falls evaluation completed Falls evaluation completed Falls evaluation completed    FALL RISK PREVENTION PERTAINING TO THE HOME: Home free of loose throw rugs in walkways, pet beds, electrical cords, etc? Yes  Adequate lighting in your home to reduce risk of falls? Yes   ASSISTIVE DEVICES UTILIZED TO PREVENT FALLS: Life alert? No  Use of a cane, walker or w/c? No  Grab bars in the bathroom? Yes Shower chair or bench in shower? Yes Comfort chair height or Elevated toilet seat or a handicapped toilet? Yes  TIMED UP AND GO: Was the test performed? No .   Cognitive Function:        02/20/2023   10:43 AM  6CIT Screen  What Year? 0 points  What month? 0 points  What time? 0 points  Count back from 20 0 points  Months in reverse 0 points  Repeat phrase 0 points  Total Score  0 points    Immunizations Immunization History  Administered Date(s) Administered   Fluad Quad(high Dose 65+) 08/23/2019, 09/21/2020, 10/05/2021   Influenza Split 07/10/2013   Influenza, High Dose Seasonal PF 07/27/2018, 09/20/2022   Influenza,inj,Quad PF,6+ Mos 07/08/2014, 07/21/2015, 07/15/2016, 07/21/2017   PFIZER(Purple Top)SARS-COV-2 Vaccination 01/13/2020, 02/05/2020, 12/25/2020   Pfizer Covid-19 Vaccine Bivalent Booster 83yrs & up 11/01/2022   Pneumococcal Conjugate-13  09/13/2019   Pneumococcal Polysaccharide-23 09/28/2020   Td 09/26/2016   Covid-19 vaccine status: Completed vaccines x4.  Shingrix Completed?: No.    Education has been provided regarding the importance of this vaccine. Patient has been advised to call insurance company to determine out of pocket expense if they have not yet received this vaccine. Advised may also receive vaccine at local pharmacy or Health Dept. Verbalized acceptance and understanding.  Screening Tests Health Maintenance  Topic Date Due   COVID-19 Vaccine (5 - 2023-24 season) 03/08/2023 (Originally 12/27/2022)   Hepatitis C Screening  03/24/2023 (Originally 08/30/1969)   Zoster Vaccines- Shingrix (1 of 2) 05/22/2023 (Originally 08/30/2002)   MAMMOGRAM  04/15/2023   INFLUENZA VACCINE  05/25/2023   Medicare Annual Wellness (AWV)  02/20/2024   DTaP/Tdap/Td (2 - Tdap) 09/26/2026   COLONOSCOPY (Pts 45-68yrs Insurance coverage will need to be confirmed)  12/24/2026   Pneumonia Vaccine 10+ Years old  Completed   DEXA SCAN  Completed   HPV VACCINES  Aged Out    Health Maintenance There are no preventive care reminders to display for this patient.  Lung Cancer Screening: (Low Dose CT Chest recommended if Age 33-80 years, 30 pack-year currently smoking OR have quit w/in 15years.) does not qualify.   Vision Screening: Recommended annual ophthalmology exams for early detection of glaucoma and other disorders of the eye.  Dental Screening: Recommended annual dental exams for proper oral hygiene  Community Resource Referral / Chronic Care Management: CRR required this visit?  No   CCM required this visit?  No      Plan:     I have personally reviewed and noted the following in the patient's chart:   Medical and social history Use of alcohol, tobacco or illicit drugs  Current medications and supplements including opioid prescriptions. Patient is not currently taking opioid prescriptions. Functional ability and  status Nutritional status Physical activity Advanced directives List of other physicians Hospitalizations, surgeries, and ER visits in previous 12 months Vitals Screenings to include cognitive, depression, and falls Referrals and appointments  In addition, I have reviewed and discussed with patient certain preventive protocols, quality metrics, and best practice recommendations. A written personalized care plan for preventive services as well as general preventive health recommendations were provided to patient.     Cathey Endow, LPN   1/61/0960

## 2023-02-20 NOTE — Patient Instructions (Addendum)
Allison [name] , Thank you for taking time to come for your Medicare Wellness Visit. I appreciate your ongoing commitment to your health goals. Please review the following plan we discussed and let me know if I can assist you in the future.   These are the goals we discussed:  Goals       Patient Stated     Increase physical activity (pt-stated)      I would like to walk more for exercise.        This is a list of the screening recommended for you and due dates:  Health Maintenance  Topic Date Due   COVID-19 Vaccine (5 - 2023-24 season) 03/08/2023*   Hepatitis C Screening: USPSTF Recommendation to screen - Ages 18-79 yo.  03/24/2023*   Zoster (Shingles) Vaccine (1 of 2) 05/22/2023*   Mammogram  04/15/2023   Flu Shot  05/25/2023   Medicare Annual Wellness Visit  02/20/2024   DTaP/Tdap/Td vaccine (2 - Tdap) 09/26/2026   Colon Cancer Screening  12/24/2026   Pneumonia Vaccine  Completed   DEXA scan (bone density measurement)  Completed   HPV Vaccine  Aged Out  *Topic was postponed. The date shown is not the original due date.    Advanced directives: on file  Conditions/risks identified: none new  Next appointment: Follow up in one year for your annual wellness visit    Preventive Care 65 Years and Older, Female Preventive care refers to lifestyle choices and visits with your health care provider that can promote health and wellness. What does preventive care include? A yearly physical exam. This is also called an annual well check. Dental exams once or twice a year. Routine eye exams. Ask your health care provider how often you should have your eyes checked. Personal lifestyle choices, including: Daily care of your teeth and gums. Regular physical activity. Eating a healthy diet. Avoiding tobacco and drug use. Limiting alcohol use. Practicing safe sex. Taking low-dose aspirin every day. Taking vitamin and mineral supplements as recommended by your health care  provider. What happens during an annual well check? The services and screenings done by your health care provider during your annual well check will depend on your age, overall health, lifestyle risk factors, and family history of disease. Counseling  Your health care provider may ask you questions about your: Alcohol use. Tobacco use. Drug use. Emotional well-being. Home and relationship well-being. Sexual activity. Eating habits. History of falls. Memory and ability to understand (cognition). Work and work Astronomer. Reproductive health. Screening  You may have the following tests or measurements: Height, weight, and BMI. Blood pressure. Lipid and cholesterol levels. These may be checked every 5 years, or more frequently if you are over 37 years old. Skin check. Lung cancer screening. You may have this screening every year starting at age 38 if you have a 30-pack-year history of smoking and currently smoke or have quit within the past 15 years. Fecal occult blood test (FOBT) of the stool. You may have this test every year starting at age 55. Flexible sigmoidoscopy or colonoscopy. You may have a sigmoidoscopy every 5 years or a colonoscopy every 10 years starting at age 39. Hepatitis C blood test. Hepatitis B blood test. Sexually transmitted disease (STD) testing. Diabetes screening. This is done by checking your blood sugar (glucose) after you have not eaten for a while (fasting). You may have this done every 1-3 years. Bone density scan. This is done to screen for osteoporosis. You may have this  done starting at age 69. Mammogram. This may be done every 1-2 years. Talk to your health care provider about how often you should have regular mammograms. Talk with your health care provider about your test results, treatment options, and if necessary, the need for more tests. Vaccines  Your health care provider may recommend certain vaccines, such as: Influenza vaccine. This is  recommended every year. Tetanus, diphtheria, and acellular pertussis (Tdap, Td) vaccine. You may need a Td booster every 10 years. Zoster vaccine. You may need this after age 76. Pneumococcal 13-valent conjugate (PCV13) vaccine. One dose is recommended after age 87. Pneumococcal polysaccharide (PPSV23) vaccine. One dose is recommended after age 58. Talk to your health care provider about which screenings and vaccines you need and how often you need them. This information is not intended to replace advice given to you by your health care provider. Make sure you discuss any questions you have with your health care provider. Document Released: 11/06/2015 Document Revised: 06/29/2016 Document Reviewed: 08/11/2015 Elsevier Interactive Patient Education  2017 [place] Prevention in the Home Falls can cause injuries. They can happen to people of all ages. There are many things you can do to make your home safe and to help prevent falls. What can I do on the outside of my home? Regularly fix the edges of walkways and driveways and fix any cracks. Remove anything that might make you trip as you walk through a door, such as a raised step or threshold. Trim any bushes or trees on the path to your home. Use bright outdoor lighting. Clear any walking paths of anything that might make someone trip, such as rocks or tools. Regularly check to see if handrails are loose or broken. Make sure that both sides of any steps have handrails. Any raised decks and porches should have guardrails on the edges. Have any leaves, snow, or ice cleared regularly. Use sand or salt on walking paths during winter. Clean up any spills in your garage right away. This includes oil or grease spills. What can I do in the bathroom? Use night lights. Install grab bars by the toilet and in the tub and shower. Do not use towel bars as grab bars. Use non-skid mats or decals in the tub or shower. If you need to sit down in  the shower, use a plastic, non-slip stool. Keep the floor dry. Clean up any water that spills on the floor as soon as it happens. Remove soap buildup in the tub or shower regularly. Attach bath mats securely with double-sided non-slip rug tape. Do not have throw rugs and other things on the floor that can make you trip. What can I do in the bedroom? Use night lights. Make sure that you have a light by your bed that is easy to reach. Do not use any sheets or blankets that are too big for your bed. They should not hang down onto the floor. Have a firm chair that has side arms. You can use this for support while you get dressed. Do not have throw rugs and other things on the floor that can make you trip. What can I do in the kitchen? Clean up any spills right away. Avoid walking on wet floors. Keep items that you use a lot in easy-to-reach places. If you need to reach something above you, use a strong step stool that has a grab bar. Keep electrical cords out of the way. Do not use floor polish or wax  that makes floors slippery. If you must use wax, use non-skid floor wax. Do not have throw rugs and other things on the floor that can make you trip. What can I do with my stairs? Do not leave any items on the stairs. Make sure that there are handrails on both sides of the stairs and use them. Fix handrails that are broken or loose. Make sure that handrails are as long as the stairways. Check any carpeting to make sure that it is firmly attached to the stairs. Fix any carpet that is loose or worn. Avoid having throw rugs at the top or bottom of the stairs. If you do have throw rugs, attach them to the floor with carpet tape. Make sure that you have a light switch at the top of the stairs and the bottom of the stairs. If you do not have them, ask someone to add them for you. What else can I do to help prevent falls? Wear shoes that: Do not have high heels. Have rubber bottoms. Are comfortable  and fit you well. Are closed at the toe. Do not wear sandals. If you use a stepladder: Make sure that it is fully opened. Do not climb a closed stepladder. Make sure that both sides of the stepladder are locked into place. Ask someone to hold it for you, if possible. Clearly mark and make sure that you can see: Any grab bars or handrails. First and last steps. Where the edge of each step is. Use tools that help you move around (mobility aids) if they are needed. These include: Canes. Walkers. Scooters. Crutches. Turn on the lights when you go into a dark area. Replace any light bulbs as soon as they burn out. Set up your furniture so you have a clear path. Avoid moving your furniture around. If any of your floors are uneven, fix them. If there are any pets around you, be aware of where they are. Review your medicines with your doctor. Some medicines can make you feel dizzy. This can increase your chance of falling. Ask your doctor what other things that you can do to help prevent falls. This information is not intended to replace advice given to you by your health care provider. Make sure you discuss any questions you have with your health care provider. Document Released: 08/06/2009 Document Revised: 03/17/2016 Document Reviewed: 11/14/2014 Elsevier Interactive Patient Education  2017 [employer].

## 2023-02-24 ENCOUNTER — Ambulatory Visit: Payer: Medicare HMO

## 2023-03-12 ENCOUNTER — Other Ambulatory Visit: Payer: Self-pay | Admitting: Cardiovascular Disease

## 2023-03-27 ENCOUNTER — Telehealth: Payer: Self-pay | Admitting: Internal Medicine

## 2023-03-27 NOTE — Telephone Encounter (Signed)
Patient's daughter Merry Proud walked into the office at lunch time and wanted to speak to [place] or Dr Lorin Picket. She would like someone to call  her, [phone]. Daughter stated that her mother is having memory issues. When she had her last AWV on the telephone patient wrote down what she was suppose to remember and could call it back to the nurse. She will repeat things a few times within 1 hour. Patient does have a physical coming up in July. Daughter also stated when patient is in car as a passenger she is not sure where she is going.

## 2023-03-29 NOTE — Telephone Encounter (Signed)
LMTCB

## 2023-03-29 NOTE — Telephone Encounter (Signed)
Ok to schedule earlier appt to discuss and initiate w/up if agreeable.  (Schedule end  of 1/2 day if possible)

## 2023-03-29 NOTE — Telephone Encounter (Signed)
Spoke with patients daughter(on DPR.) This has been going on for months. Daughter noted that she started noticing gradual changes in November. Recently patient has been repeating herself more. She was scheduled for neck CT and no showed her appointment because she thought it was at a different time (never rescheduled appt), also told daughter that her appt on 7/8 with you was to have hip injections (this is her CPE appt.) Daughter has requested to have a conversation with her mother about her concerns prior to [place] calling but she wanted [place] to be aware of what was going on. Daughter also asked about medalert necklaces since pt lives alone. I believe daughter is planning to come to appt with pt if pt agrees. Provided daughter with number to radiology to reschedule the CT of her neck

## 2023-04-06 ENCOUNTER — Ambulatory Visit
Admission: RE | Admit: 2023-04-06 | Discharge: 2023-04-06 | Disposition: A | Discharge status: Home / Self Care | Payer: Medicare HMO | Source: Ambulatory Visit | Attending: Internal Medicine | Admitting: Internal Medicine

## 2023-04-06 DIAGNOSIS — R221 Localized swelling, mass and lump, neck: Secondary | ICD-10-CM | POA: Insufficient documentation

## 2023-04-13 ENCOUNTER — Telehealth: Payer: Self-pay

## 2023-04-13 NOTE — Telephone Encounter (Signed)
Called patient to let her know that we do not have results yet.

## 2023-04-13 NOTE — Telephone Encounter (Signed)
Patient states she recently had a CAT scan on her throat and she has not heard from [place] regarding her results.  Please call.  Patient states she is not in a hurry, she is just wondering about the results since it's been a week.  Patient states we can leave a message for her if she does not answer.

## 2023-04-17 ENCOUNTER — Telehealth: Payer: Self-pay

## 2023-04-17 DIAGNOSIS — R221 Localized swelling, mass and lump, neck: Secondary | ICD-10-CM

## 2023-04-17 DIAGNOSIS — E079 Disorder of thyroid, unspecified: Secondary | ICD-10-CM

## 2023-04-17 NOTE — Telephone Encounter (Signed)
Called pt to go over results but was unable to lvm

## 2023-04-17 NOTE — Telephone Encounter (Signed)
Pt returned [facility] CMA call. Note from imaging result from provider was read to pt. Pt aware and understood. Pt stated yes for Dr. Lorin Picket to go ahead and proceed with that endo referral.

## 2023-04-17 NOTE — Telephone Encounter (Signed)
Need to know if she prefers to stay in town ( if so, [facility] endocrinology).  If out of town, can refer to [facility] endocrinology.

## 2023-04-17 NOTE — Telephone Encounter (Signed)
Pt aware of results. I can place referral just need to know where to send referral.

## 2023-04-18 ENCOUNTER — Ambulatory Visit
Admission: RE | Admit: 2023-04-18 | Discharge: 2023-04-18 | Disposition: A | Discharge status: Home / Self Care | Payer: Medicare HMO | Source: Ambulatory Visit | Attending: Internal Medicine | Admitting: Internal Medicine

## 2023-04-18 ENCOUNTER — Telehealth: Payer: Self-pay | Admitting: Internal Medicine

## 2023-04-18 DIAGNOSIS — Z1231 Encounter for screening mammogram for malignant neoplasm of breast: Secondary | ICD-10-CM | POA: Insufficient documentation

## 2023-04-18 NOTE — Telephone Encounter (Signed)
Ok

## 2023-04-18 NOTE — Addendum Note (Signed)
Addended by: Rita Ohara D on: 04/18/2023 08:33 AM   Modules accepted: Orders

## 2023-04-18 NOTE — Telephone Encounter (Signed)
Endo referral placed. 

## 2023-04-18 NOTE — Telephone Encounter (Signed)
Pt came into the office wanting to know if Lorin Picket will take her daughter on as a new patient

## 2023-04-19 NOTE — Progress Notes (Unsigned)
Cardiology Office Note  Date:  04/20/2023   ID:  Allison [name], DOB [birth year], MRN [id number]  PCP:  Dale [place], MD   Chief Complaint  Patient presents with   12 month follow up     "Doing well." Medications reviewed by the patient verbally.      HPI:  Allison [name] is a 72 year old woman with past medical history of paroxysmal atrial fibrillation several episodes 01/2019 Essential hypertension Seen in the emergency room January 31, 2019 for Atrial fibrillation Ejection fraction normal greater than 55% in July 2020 Who presents for follow-up of her paroxysmal atrial fibrillation  Last seen by myself in clinic 6/23 In follow-up today she reports feeling relatively well on her current medication regiment Taking long-acting diltiazem 120 daily, metoprolol succinate 75 daily with Xarelto 20 daily  No significant bruising or bleeding Hemoglobin stable 14.1  On the above regiment, does not feel she is having much atrial fibrillation No orthostasis symptoms Takes potassium daily, potassium level stable on recent lab work  Echocardiogram done July 2020, normal LV function, RV function, discussed in detail  EKG personally reviewed by myself on todays visit EKG Interpretation Date/Time:  Thursday April 20 2023 12:08:46 EDT Ventricular Rate:  61 PR Interval:  112 QRS Duration:  86 QT Interval:  432 QTC Calculation: 434 R Axis:   -25  Text Interpretation: Normal sinus rhythm Normal ECG When compared with ECG of 13-Dec-2020 19:49, Premature supraventricular complexes are no longer Present Confirmed by Julien Nordmann [phone]) on 04/20/2023 12:35:21 PM     Other past medical history reviewed CT of ABD 2017, Minimal descending aorta atherosclerosis, was described as moderate but there is minimal plaque  Family history Mother with a CVA, age 27 Brother with TIA/CVA Strong family hx of atrial fib  Atrial fib 01/31/2019, 11 Am developed tachycardia Went to the  emergency room, EKG showing atrial fibrillation 190 bpm  given diltiazem IV bolus repeat dosing, improved rate down to 130 bpm Discharged on Cardizem CD 120 mg daily  potassium was low at 3.2, on HCTZ  PMH:   has a past medical history of A-fib (HCC), GERD (gastroesophageal reflux disease), Hypercholesterolemia, Hypertension, Migraine headache, and Psoriasis.  PSH:    Past Surgical History:  Procedure Laterality Date   COLONOSCOPY WITH PROPOFOL N/A 12/23/2016   Procedure: COLONOSCOPY WITH PROPOFOL;  Surgeon: Christena Deem, MD;  Location: [facility] ENDOSCOPY;  Service: Endoscopy;  Laterality: N/A;   DILATION AND CURETTAGE OF UTERUS     history of abnormal bleeding   TUBAL LIGATION      Current Outpatient Medications  Medication Sig Dispense Refill   alendronate (FOSAMAX) 70 MG tablet TAKE 1 TABLET BY MOUTH ONCE A WEEK ON AN EMPTY STOMACH WITH A FULL GLASS OF WATER. 12 tablet 0   ALPRAZolam (XANAX) 0.25 MG tablet TAKE 1 TABLET BY MOUTH ONCE DAILY AS NEEDED FOR ANXIETY 30 tablet 0   Calcium Carb-Cholecalciferol (CALCIUM 500/VITAMIN D PO) Take 2,000 Units by mouth daily.     diltiazem (CARDIZEM CD) 120 MG 24 hr capsule Take 1 capsule (120 mg total) by mouth daily. 90 capsule 3   diltiazem (CARDIZEM) 30 MG tablet TAKE 1 TABLET BY MOUTH THREE TIMES DAILY AS NEEDED FOR  TACHYCARDIA/  ATRIAL  FIB 90 tablet 3   hyoscyamine (LEVSIN) 0.125 MG tablet TAKE 1 TABLET BY MOUTH EVERY 6 HOURS AS NEEDED 30 tablet 0   metoprolol succinate (TOPROL-XL) 50 MG 24 hr tablet TAKE 1 & 1/2 (ONE &  ONE-HALF) TABLETS BY MOUTH ONCE DAILY WITH OR IMMEDIATELY FOLLOWING A MEAL 135 tablet 3   omeprazole (PRILOSEC) 20 MG capsule Take 1 capsule by mouth once daily 90 capsule 1   potassium chloride (KLOR-CON) 10 MEQ tablet Take 1 tablet by mouth once daily 90 tablet 0   rosuvastatin (CRESTOR) 10 MG tablet Take 1 tablet (10 mg total) by mouth daily. 90 tablet 1   XARELTO 20 MG TABS tablet TAKE 1 TABLET BY MOUTH ONCE DAILY WITH  SUPPER 90 tablet 0   No current facility-administered medications for this visit.    Allergies:   Iodine   Social History:  The patient  reports that she has never smoked. She has never used smokeless tobacco. She reports that she does not drink alcohol and does not use drugs.   Family History:   family history includes Asthma in her father; Breast cancer in her maternal grandmother and paternal grandmother; CVA in her mother; Colon cancer in her maternal uncle and another family member; Diabetes in her brother, mother, and another family member; Psoriasis in her mother; Stroke in her brother.    Review of Systems: Review of Systems  Constitutional: Negative.   HENT: Negative.    Respiratory: Negative.    Cardiovascular: Negative.   Gastrointestinal: Negative.   Musculoskeletal: Negative.   Neurological: Negative.   Psychiatric/Behavioral: Negative.    All other systems reviewed and are negative.   PHYSICAL EXAM: VS:  BP 112/70 (BP Location: Left Arm, Patient Position: Sitting, Cuff Size: Normal)   Pulse 61   Ht 5\' 8"  (1.727 m)   Wt 170 lb (77.1 kg)   SpO2 96%   BMI 25.85 kg/m  , BMI Body mass index is 25.85 kg/m.  Constitutional:  oriented to person, place, and time. No distress.  HENT:  Head: Grossly normal Eyes:  no discharge. No scleral icterus.  Neck: No JVD, no carotid bruits  Cardiovascular: Regular rate and rhythm, no murmurs appreciated Pulmonary/Chest: Clear to auscultation bilaterally, no wheezes or rails Abdominal: Soft.  no distension.  no tenderness.  Musculoskeletal: Normal range of motion Neurological:  normal muscle tone. Coordination normal. No atrophy Skin: Skin warm and dry Psychiatric: normal affect, pleasant   Recent Labs: 11/24/2022: ALT 16 02/16/2023: BUN 16; Creatinine, Ser 0.71; Hemoglobin 14.1; Platelets 254.0; Potassium 4.1; Sodium 140; TSH 0.72    Lipid Panel Lab Results  Component Value Date   CHOL 176 11/24/2022   HDL 57.10  11/24/2022   LDLCALC 97 11/24/2022   TRIG 109.0 11/24/2022    Wt Readings from Last 3 Encounters:  04/20/23 170 lb (77.1 kg)  02/20/23 169 lb (76.7 kg)  02/16/23 169 lb 9.6 oz (76.9 kg)     ASSESSMENT AND PLAN:  Problem List Items Addressed This Visit       Cardiology Problems   Hypertension   Paroxysmal atrial fibrillation (HCC) - Primary   Relevant Orders   EKG 12-Lead (Completed)   Other Visit Diagnoses     Essential hypertension       Relevant Orders   EKG 12-Lead (Completed)   Mixed hyperlipidemia       PVC (premature ventricular contraction)       Relevant Orders   EKG 12-Lead (Completed)   PAC (premature atrial contraction)         Paroxysmal atrial fibrillation (HCC) -  Recommend she continue diltiazem ER 120 daily with metoprolol succinate 75 daily CHADS VASC 2, continue Xarelto 20 daily Recommend continued use of diltiazem 30  mg as needed for breakthrough tachypalpitations concerning for atrial fibrillation For frequent episodes of atrial fibrillation in the future, could consider adding antiarrhythmic such as flecainide Blood pressure low, unable to titrate up on the diltiazem or metoprolol   Essential hypertension Blood pressure is well controlled on today's visit. No changes made to the medications.   Hypercholesterolemia Cholesterol is at goal on the current lipid regimen. No changes to the medications were made.  Chest pain No recent chest pain symptoms Risk factors well controlled No further testing needed at this time  Aortic atherosclerosis  on Crestor 10 mg daily, Mild disease noted Cholesterol slightly above goal   Total encounter time more than 40 minutes  Greater than 50% was spent in counseling and coordination of care with the patient    Signed, Dossie Arbour, M.D., Ph.D. [facility] [place], [place] [phone]

## 2023-04-20 ENCOUNTER — Ambulatory Visit: Payer: Medicare HMO | Attending: Cardiovascular Disease | Admitting: Cardiovascular Disease

## 2023-04-20 ENCOUNTER — Encounter: Payer: Self-pay | Admitting: Cardiovascular Disease

## 2023-04-20 VITALS — BP 112/70 | HR 61 | Ht 68.0 in | Wt 170.0 lb

## 2023-04-20 DIAGNOSIS — I10 Essential (primary) hypertension: Secondary | ICD-10-CM

## 2023-04-20 DIAGNOSIS — I493 Ventricular premature depolarization: Secondary | ICD-10-CM

## 2023-04-20 DIAGNOSIS — I491 Atrial premature depolarization: Secondary | ICD-10-CM | POA: Diagnosis not present

## 2023-04-20 DIAGNOSIS — I700 Atherosclerosis of aorta: Secondary | ICD-10-CM | POA: Diagnosis not present

## 2023-04-20 DIAGNOSIS — I480 Paroxysmal atrial fibrillation: Secondary | ICD-10-CM

## 2023-04-20 DIAGNOSIS — E782 Mixed hyperlipidemia: Secondary | ICD-10-CM | POA: Diagnosis not present

## 2023-04-20 NOTE — Patient Instructions (Signed)
Medication Instructions:  No changes  If you need a refill on your cardiac medications before your next appointment, please call your pharmacy.   Lab work: No new labs needed  Testing/Procedures: No new testing needed  Follow-Up: At [facility], you and your health needs are our priority.  As part of our continuing mission to provide you with exceptional heart care, we have created designated Provider Care Teams.  These Care Teams include your primary Cardiologist (physician) and Advanced Practice Providers (APPs -  Physician Assistants and Nurse Practitioners) who all work together to provide you with the care you need, when you need it.  You will need a follow up appointment in 12 months  Providers on your designated Care Team:   Christopher Berge, NP Ryan Dunn, PA-C Cadence Furth, PA-C  COVID-19 Vaccine Information can be found at: [email or web address] For questions related to vaccine distribution or appointments, please email [email or web address] or call [phone].   

## 2023-04-20 NOTE — Telephone Encounter (Signed)
Will you schedule a new pt appt.  

## 2023-04-27 ENCOUNTER — Other Ambulatory Visit: Payer: Self-pay | Admitting: Cardiovascular Disease

## 2023-04-27 DIAGNOSIS — I480 Paroxysmal atrial fibrillation: Secondary | ICD-10-CM

## 2023-04-28 NOTE — Telephone Encounter (Signed)
Prescription refill request for Xarelto received.  Indication:afib Last office visit:6/24 Weight:77.1  kg Age:73 Scr:0.71  4/24 CrCl:89.74  ml/min  Prescription refilled

## 2023-04-28 NOTE — Telephone Encounter (Signed)
Refill Request.  

## 2023-05-01 ENCOUNTER — Encounter: Payer: Self-pay | Admitting: Internal Medicine

## 2023-05-01 ENCOUNTER — Telehealth: Payer: Self-pay

## 2023-05-01 ENCOUNTER — Ambulatory Visit (INDEPENDENT_AMBULATORY_CARE_PROVIDER_SITE_OTHER): Payer: Medicare HMO | Admitting: Internal Medicine

## 2023-05-01 VITALS — BP 112/74 | HR 69 | Temp 97.8°F | Resp 16 | Ht 68.0 in | Wt 168.8 lb

## 2023-05-01 DIAGNOSIS — R413 Other amnesia: Secondary | ICD-10-CM | POA: Insufficient documentation

## 2023-05-01 DIAGNOSIS — E7800 Pure hypercholesterolemia, unspecified: Secondary | ICD-10-CM

## 2023-05-01 DIAGNOSIS — E041 Nontoxic single thyroid nodule: Secondary | ICD-10-CM | POA: Diagnosis not present

## 2023-05-01 DIAGNOSIS — I480 Paroxysmal atrial fibrillation: Secondary | ICD-10-CM

## 2023-05-01 DIAGNOSIS — M810 Age-related osteoporosis without current pathological fracture: Secondary | ICD-10-CM

## 2023-05-01 DIAGNOSIS — K219 Gastro-esophageal reflux disease without esophagitis: Secondary | ICD-10-CM

## 2023-05-01 DIAGNOSIS — R739 Hyperglycemia, unspecified: Secondary | ICD-10-CM

## 2023-05-01 DIAGNOSIS — I10 Essential (primary) hypertension: Secondary | ICD-10-CM | POA: Diagnosis not present

## 2023-05-01 LAB — BASIC METABOLIC PANEL
BUN: 17 mg/dL (ref 6–23)
CO2: 30 mEq/L (ref 19–32)
Calcium: 9.7 mg/dL (ref 8.4–10.5)
Chloride: 102 mEq/L (ref 96–112)
Creatinine, Ser: 0.72 mg/dL (ref 0.40–1.20)
GFR: 84.57 mL/min (ref >60.00)
Glucose, Bld: 117 mg/dL — ABNORMAL HIGH (ref 70–99)
Potassium: 4.4 mEq/L (ref 3.5–5.1)
Sodium: 141 mEq/L (ref 135–145)

## 2023-05-01 LAB — LIPID PANEL
Cholesterol: 175 mg/dL (ref 0–200)
HDL: 57.6 mg/dL (ref >39.00)
LDL Cholesterol: 96 mg/dL (ref 0–99)
NonHDL: 117.73
Total CHOL/HDL Ratio: 3
Triglycerides: 110 mg/dL (ref 0.0–149.0)
VLDL: 22 mg/dL (ref 0.0–40.0)

## 2023-05-01 LAB — CBC WITH DIFFERENTIAL/PLATELET
Basophils Absolute: 0.1 10*3/uL (ref 0.0–0.1)
Basophils Relative: 0.9 % (ref 0.0–3.0)
Eosinophils Absolute: 0.2 10*3/uL (ref 0.0–0.7)
Eosinophils Relative: 3.2 % (ref 0.0–5.0)
HCT: 43.9 % (ref 36.0–46.0)
Hemoglobin: 14.3 g/dL (ref 12.0–15.0)
Lymphocytes Relative: 20 % (ref 12.0–46.0)
Lymphs Abs: 1.1 10*3/uL (ref 0.7–4.0)
MCHC: 32.5 g/dL (ref 30.0–36.0)
MCV: 95.8 fl (ref 78.0–100.0)
Monocytes Absolute: 0.6 10*3/uL (ref 0.1–1.0)
Monocytes Relative: 10.2 % (ref 3.0–12.0)
Neutro Abs: 3.7 10*3/uL (ref 1.4–7.7)
Neutrophils Relative %: 65.7 % (ref 43.0–77.0)
Platelets: 250 10*3/uL (ref 150.0–400.0)
RBC: 4.59 Mil/uL (ref 3.87–5.11)
RDW: 12.9 % (ref 11.5–15.5)
WBC: 5.7 10*3/uL (ref 4.0–10.5)

## 2023-05-01 LAB — HEPATIC FUNCTION PANEL
ALT: 13 U/L (ref 0–35)
AST: 19 U/L (ref 0–37)
Albumin: 4.2 g/dL (ref 3.5–5.2)
Alkaline Phosphatase: 54 U/L (ref 39–117)
Bilirubin, Direct: 0.1 mg/dL (ref 0.0–0.3)
Total Bilirubin: 0.4 mg/dL (ref 0.2–1.2)
Total Protein: 6.7 g/dL (ref 6.0–8.3)

## 2023-05-01 LAB — TSH: TSH: 1.38 u[IU]/mL (ref 0.35–5.50)

## 2023-05-01 LAB — HEMOGLOBIN A1C: Hgb A1c MFr Bld: 5.8 % (ref 4.6–6.5)

## 2023-05-01 LAB — VITAMIN B12: Vitamin B-12: 867 pg/mL (ref 211–911)

## 2023-05-01 NOTE — Assessment & Plan Note (Signed)
Discussed with her and her daughter today.  Allison [name] is not aware of a significant change. Will check MMS.  Also check routine labs - B12, cbc , tsh and metabolic panel.

## 2023-05-01 NOTE — Progress Notes (Signed)
Subjective:    Patient ID: Allison [name], female    DOB: 1951-07-24, 72 y.o.   MRN: [id number]  Patient here for  Chief Complaint  Patient presents with   Medical Management of Chronic Issues    HPI Here for scheduled follow up - f/u regarding afib (on xarelto) and hypertension.  She is accompanied by  her daughter.  History obtained from both of them. Last visit, evaluated for neck mass.  CT revealed a 3.7cm right thyroid mass.  Discussed endocrinology referral. Saw cardiology 04/20/23 - recommended no change in medication.  Continue diltiazem 30mg  prn.  She has not had any episodes of increased heart rate or palpitations in more than one year.  No chest pain or sob.  Tries to stay active.  No abdominal pain or bowel change reported.  No acid reflux. Daughter reports that Allison [name] has been repeating herself some lately and has noticed some change with remembering certain things - for example - birthday of granddaughter.    Past Medical History:  Diagnosis Date   A-fib [facility])    GERD (gastroesophageal reflux disease)    Hypercholesterolemia    Hypertension    Migraine headache    Psoriasis    Past Surgical History:  Procedure Laterality Date   COLONOSCOPY WITH PROPOFOL N/A 12/23/2016   Procedure: COLONOSCOPY WITH PROPOFOL;  Surgeon: Christena Deem, MD;  Location: [facility] ENDOSCOPY;  Service: Endoscopy;  Laterality: N/A;   DILATION AND CURETTAGE OF UTERUS     history of abnormal bleeding   TUBAL LIGATION     Family History  Problem Relation Age of Onset   Asthma Father    Diabetes Mother    CVA Mother    Psoriasis Mother    Breast cancer Maternal Grandmother    Breast cancer Paternal Grandmother    Colon cancer Maternal Uncle    Colon cancer Other        nephew   Diabetes Other        multiple relatives (both sides)   Stroke Brother        Light stroke   Diabetes Brother    Social History   Socioeconomic History   Marital status: Single    Spouse name: Not  on file   Number of children: 1   Years of education: Not on file   Highest education level: Not on file  Occupational History   Not on file  Tobacco Use   Smoking status: Never   Smokeless tobacco: Never  Vaping Use   Vaping Use: Never used  Substance and Sexual Activity   Alcohol use: No    Alcohol/week: 0.0 standard drinks of alcohol   Drug use: No   Sexual activity: Not on file  Other Topics Concern   Not on file  Social History Narrative   Not on file   Social Determinants of Health   Financial Resource Strain: Low Risk  (02/20/2023)   Overall Financial Resource Strain (CARDIA)    Difficulty of Paying Living Expenses: Not hard at all  Food Insecurity: No Food Insecurity (02/20/2023)   Hunger Vital Sign    Worried About Running Out of Food in the Last Year: Never true    Ran Out of Food in the Last Year: Never true  Transportation Needs: No Transportation Needs (02/20/2023)   PRAPARE - Administrator, Civil Service (Medical): No    Lack of Transportation (Non-Medical): No  Physical Activity: Sufficiently Active (02/20/2023)   Exercise  Vital Sign    Days of Exercise per Week: 5 days    Minutes of Exercise per Session: 30 min  Stress: No Stress Concern Present (02/20/2023)   [employer] of Occupational Health - Occupational Stress Questionnaire    Feeling of Stress : Not at all  Social Connections: Unknown (02/20/2023)   Social Connection and Isolation Panel [NHANES]    Frequency of Communication with Friends and Family: More than three times a week    Frequency of Social Gatherings with Friends and Family: More than three times a week    Attends Religious Services: Not on Marketing executive or Organizations: Not on file    Attends Banker Meetings: Not on file    Marital Status: Not on file     Review of Systems  Constitutional:  Negative for appetite change and unexpected weight change.  HENT:  Negative for congestion and  sinus pressure.   Respiratory:  Negative for cough, chest tightness and shortness of breath.   Cardiovascular:  Negative for chest pain, palpitations and leg swelling.  Gastrointestinal:  Negative for abdominal pain, diarrhea, nausea and vomiting.  Genitourinary:  Negative for difficulty urinating and dysuria.  Musculoskeletal:  Negative for joint swelling and myalgias.  Skin:  Negative for color change and rash.  Neurological:  Negative for dizziness and headaches.  Psychiatric/Behavioral:  Negative for agitation and dysphoric mood.        Objective:     BP 112/74   Pulse 69   Temp 97.8 F (36.6 C)   Resp 16   Ht 5\' 8"  (1.727 m)   Wt 168 lb 12.8 oz (76.6 kg)   SpO2 98%   BMI 25.67 kg/m  Wt Readings from Last 3 Encounters:  05/01/23 168 lb 12.8 oz (76.6 kg)  04/20/23 170 lb (77.1 kg)  02/20/23 169 lb (76.7 kg)    Physical Exam Vitals reviewed.  Constitutional:      General: She is not in acute distress.    Appearance: Normal appearance.  HENT:     Head: Normocephalic and atraumatic.     Right Ear: External ear normal.     Left Ear: External ear normal.  Eyes:     General: No scleral icterus.       Right eye: No discharge.        Left eye: No discharge.     Conjunctiva/sclera: Conjunctivae normal.  Neck:     Thyroid: No thyromegaly.  Cardiovascular:     Rate and Rhythm: Normal rate and regular rhythm.  Pulmonary:     Effort: No respiratory distress.     Breath sounds: Normal breath sounds. No wheezing.  Abdominal:     General: Bowel sounds are normal.     Palpations: Abdomen is soft.     Tenderness: There is no abdominal tenderness.  Musculoskeletal:        General: No swelling or tenderness.     Cervical back: Neck supple. No tenderness.  Lymphadenopathy:     Cervical: No cervical adenopathy.  Skin:    Findings: No erythema or rash.  Neurological:     Mental Status: She is alert.  Psychiatric:        Mood and Affect: Mood normal.        Behavior:  Behavior normal.      Outpatient Encounter Medications as of 05/01/2023  Medication Sig   alendronate (FOSAMAX) 70 MG tablet TAKE 1 TABLET BY MOUTH ONCE A WEEK  ON AN EMPTY STOMACH WITH A FULL GLASS OF WATER. (Patient not taking: Reported on 05/01/2023)   ALPRAZolam (XANAX) 0.25 MG tablet TAKE 1 TABLET BY MOUTH ONCE DAILY AS NEEDED FOR ANXIETY   Calcium Carb-Cholecalciferol (CALCIUM 500/VITAMIN D PO) Take 2,000 Units by mouth daily.   diltiazem (CARDIZEM CD) 120 MG 24 hr capsule Take 1 capsule (120 mg total) by mouth daily.   diltiazem (CARDIZEM) 30 MG tablet TAKE 1 TABLET BY MOUTH THREE TIMES DAILY AS NEEDED FOR  TACHYCARDIA/  ATRIAL  FIB   hyoscyamine (LEVSIN) 0.125 MG tablet TAKE 1 TABLET BY MOUTH EVERY 6 HOURS AS NEEDED   metoprolol succinate (TOPROL-XL) 50 MG 24 hr tablet TAKE 1 & 1/2 (ONE & ONE-HALF) TABLETS BY MOUTH ONCE DAILY WITH OR IMMEDIATELY FOLLOWING A MEAL   omeprazole (PRILOSEC) 20 MG capsule Take 1 capsule by mouth once daily   potassium chloride (KLOR-CON) 10 MEQ tablet Take 1 tablet by mouth once daily   rivaroxaban (XARELTO) 20 MG TABS tablet TAKE 1 TABLET BY MOUTH ONCE DAILY WITH SUPPER   rosuvastatin (CRESTOR) 10 MG tablet Take 1 tablet (10 mg total) by mouth daily.   No facility-administered encounter medications on file as of 05/01/2023.     Lab Results  Component Value Date   WBC 5.9 02/16/2023   HGB 14.1 02/16/2023   HCT 41.4 02/16/2023   PLT 254.0 02/16/2023   GLUCOSE 116 (H) 02/16/2023   CHOL 176 11/24/2022   TRIG 109.0 11/24/2022   HDL 57.10 11/24/2022   LDLCALC 97 11/24/2022   ALT 16 11/24/2022   AST 21 11/24/2022   NA 140 02/16/2023   K 4.1 02/16/2023   CL 103 02/16/2023   CREATININE 0.71 02/16/2023   BUN 16 02/16/2023   CO2 29 02/16/2023   TSH 0.72 02/16/2023   INR 2.3 (H) 12/13/2020   HGBA1C 5.8 11/24/2022    MM 3D SCREEN BREAST BILATERAL  Result Date: 04/20/2023 CLINICAL DATA:  Screening. EXAM: DIGITAL SCREENING BILATERAL MAMMOGRAM WITH  TOMOSYNTHESIS AND CAD TECHNIQUE: Bilateral screening digital craniocaudal and mediolateral oblique mammograms were obtained. Bilateral screening digital breast tomosynthesis was performed. The images were evaluated with computer-aided detection. COMPARISON:  Previous exam(s). ACR Breast Density Category b: There are scattered areas of fibroglandular density. FINDINGS: There are no findings suspicious for malignancy. IMPRESSION: No mammographic evidence of malignancy. A result letter of this screening mammogram will be mailed directly to the patient. RECOMMENDATION: Screening mammogram in one year. (Code:SM-B-01Y) BI-RADS CATEGORY  1: Negative. Electronically Signed   By: Meda Klinefelter M.D.   On: 04/20/2023 13:16       Assessment & Plan:  Hypercholesterolemia Assessment & Plan: On Crestor.  Low cholesterol diet and exercise.  Follow lipid panel and liver function tests.    Orders: -     Lipid panel -     Hepatic function panel -     CBC with Differential/Platelet  Primary hypertension Assessment & Plan: Continue metoprolol and cardizem. Blood pressure doing well.  Follow pressures.  Follow met b.   Orders: -     Basic metabolic panel  Hyperglycemia Assessment & Plan: Continue low carb diet and exercise.  Follow met b and a1c.   Orders: -     Hemoglobin A1c  Paroxysmal atrial fibrillation (HCC) Assessment & Plan: Continue xarelto.  Continue cardizem CD 120mg  q day and toprol q day.  Appears to be doing well on these medications.  Has diltiazem 30mg  prn to take if needed.  Reports stable.  Just saw cardiology. No changes made.    Thyroid nodule Assessment & Plan: 3.7 cm thyroid mass noted on CT neck.  Referred to endocrinology for further evaluation.  F/u on referral appt.   Orders: -     TSH -     T4  Memory change Assessment & Plan: Discussed with her and her daughter today.  Allison Udoh is not aware of a significant change. Will check MMS.  Also check routine labs -  B12, cbc , tsh and metabolic panel.    Orders: -     Vitamin B12 -     CBC with Differential/Platelet  Gastroesophageal reflux disease, unspecified whether esophagitis present Assessment & Plan: Controlled on prilosec.    Osteoporosis, unspecified osteoporosis type, unspecified pathological fracture presence Assessment & Plan: Not sure if she is taking fosamax.  She is going to check her medications when she gets home.  Continue calcium, vitamin D and weight bearing exercise.  Discussed plans for f/u bone density in future.       Dale [place], MD

## 2023-05-01 NOTE — Assessment & Plan Note (Signed)
Continue metoprolol and cardizem. Blood pressure doing well.  Follow pressures.  Follow met b.  °

## 2023-05-01 NOTE — Assessment & Plan Note (Signed)
Controlled on prilosec.   

## 2023-05-01 NOTE — Assessment & Plan Note (Signed)
On Crestor.  Low cholesterol diet and exercise.  Follow lipid panel and liver function tests.   

## 2023-05-01 NOTE — Telephone Encounter (Signed)
FYI...medicaiton list has been updated.

## 2023-05-01 NOTE — Assessment & Plan Note (Addendum)
Continue xarelto.  Continue cardizem CD 120mg  q day and toprol q day.  Appears to be doing well on these medications.  Has diltiazem 30mg  prn to take if needed.  Reports stable.  Just saw cardiology. No changes made.

## 2023-05-01 NOTE — Assessment & Plan Note (Signed)
Continue low carb diet and exercise.  Follow met b and a1c.  

## 2023-05-01 NOTE — Assessment & Plan Note (Signed)
Not sure if she is taking fosamax.  She is going to check her medications when she gets home.  Continue calcium, vitamin D and weight bearing exercise.  Discussed plans for f/u bone density in future.

## 2023-05-01 NOTE — Assessment & Plan Note (Signed)
3.7 cm thyroid mass noted on CT neck.  Referred to endocrinology for further evaluation.  F/u on referral appt.

## 2023-05-01 NOTE — Telephone Encounter (Signed)
Patient states she spoke with Dr. Dale [employer] earlier today regarding some medications that she had stopped taking.  Patient states she would like for Dr. Lorin Picket to know that she last refilled her hyoscyamine (LEVSIN) 0.125 MG tablet - in March, 2020.  Patient states she is no longer taking this medication.

## 2023-05-02 ENCOUNTER — Telehealth: Payer: Self-pay | Admitting: Internal Medicine

## 2023-05-02 DIAGNOSIS — R413 Other amnesia: Secondary | ICD-10-CM

## 2023-05-02 LAB — T4: T4, Total: 7.7 ug/dL (ref 5.1–11.9)

## 2023-05-02 NOTE — Telephone Encounter (Signed)
Referral placed. Pt aware. 

## 2023-05-02 NOTE — Telephone Encounter (Signed)
Please place referral for memory issues.  [facility] neurology. Thanks

## 2023-05-02 NOTE — Addendum Note (Signed)
Addended by: Rita Ohara D on: 05/02/2023 12:37 PM   Modules accepted: Orders

## 2023-05-02 NOTE — Addendum Note (Signed)
Addended by: Rita Ohara D on: 05/02/2023 12:36 PM   Modules accepted: Orders

## 2023-05-02 NOTE — Telephone Encounter (Signed)
Allison [name], pt's daughter, [phone]. If Dr Lorin Picket refers patient out to a neurologist, Allison [name] would like her mother to see Dr Sherryll Burger or Dr Malvin Johns.

## 2023-05-02 NOTE — Telephone Encounter (Signed)
FYI

## 2023-05-03 DIAGNOSIS — D492 Neoplasm of unspecified behavior of bone, soft tissue, and skin: Secondary | ICD-10-CM | POA: Diagnosis not present

## 2023-05-03 DIAGNOSIS — L400 Psoriasis vulgaris: Secondary | ICD-10-CM | POA: Diagnosis not present

## 2023-05-04 ENCOUNTER — Other Ambulatory Visit: Payer: Self-pay

## 2023-05-04 MED ORDER — ROSUVASTATIN CALCIUM 20 MG PO TABS: 20.0000 mg | ORAL_TABLET | Freq: Every day | ORAL | 1 refills | Status: DC

## 2023-05-06 ENCOUNTER — Other Ambulatory Visit: Payer: Self-pay | Admitting: Cardiovascular Disease

## 2023-05-06 ENCOUNTER — Other Ambulatory Visit: Payer: Self-pay | Admitting: Internal Medicine

## 2023-05-15 DIAGNOSIS — E041 Nontoxic single thyroid nodule: Secondary | ICD-10-CM | POA: Diagnosis not present

## 2023-05-15 DIAGNOSIS — M810 Age-related osteoporosis without current pathological fracture: Secondary | ICD-10-CM | POA: Diagnosis not present

## 2023-06-03 ENCOUNTER — Other Ambulatory Visit: Payer: Self-pay | Admitting: Internal Medicine

## 2023-06-07 ENCOUNTER — Other Ambulatory Visit: Payer: Self-pay | Admitting: Cardiovascular Disease

## 2023-06-07 DIAGNOSIS — E041 Nontoxic single thyroid nodule: Secondary | ICD-10-CM | POA: Diagnosis not present

## 2023-06-08 ENCOUNTER — Telehealth: Payer: Self-pay

## 2023-06-08 ENCOUNTER — Other Ambulatory Visit: Payer: Self-pay | Admitting: Cardiovascular Disease

## 2023-06-08 NOTE — Telephone Encounter (Signed)
Ultrasound was done by endocrinology. Advised patient that she would get results from them but I would also request report of ultrasound for Dr Lorin Picket to have in her records.

## 2023-06-08 NOTE — Telephone Encounter (Signed)
Patient states she would like for Dr. Dale Cavour to know that she had her ultrasound on her right side collar bone today.  Patient wanted to be sure they would send the results to Dr. Lorin Picket.  I let patient know that she should receive the results since she ordered the ultrasound.  Patient would like for [place] to let her know if Dr. Lorin Picket does not receive the results.

## 2023-07-05 DIAGNOSIS — E041 Nontoxic single thyroid nodule: Secondary | ICD-10-CM | POA: Diagnosis not present

## 2023-08-02 ENCOUNTER — Encounter: Payer: Self-pay | Admitting: Internal Medicine

## 2023-08-02 ENCOUNTER — Ambulatory Visit: Payer: Medicare HMO | Admitting: Internal Medicine

## 2023-08-02 VITALS — BP 124/70 | HR 75 | Temp 98.0°F | Resp 16 | Ht 68.0 in | Wt 167.8 lb

## 2023-08-02 DIAGNOSIS — K219 Gastro-esophageal reflux disease without esophagitis: Secondary | ICD-10-CM | POA: Diagnosis not present

## 2023-08-02 DIAGNOSIS — I480 Paroxysmal atrial fibrillation: Secondary | ICD-10-CM | POA: Diagnosis not present

## 2023-08-02 DIAGNOSIS — R413 Other amnesia: Secondary | ICD-10-CM | POA: Diagnosis not present

## 2023-08-02 DIAGNOSIS — E041 Nontoxic single thyroid nodule: Secondary | ICD-10-CM

## 2023-08-02 DIAGNOSIS — E7800 Pure hypercholesterolemia, unspecified: Secondary | ICD-10-CM | POA: Diagnosis not present

## 2023-08-02 DIAGNOSIS — R739 Hyperglycemia, unspecified: Secondary | ICD-10-CM | POA: Diagnosis not present

## 2023-08-02 DIAGNOSIS — I10 Essential (primary) hypertension: Secondary | ICD-10-CM

## 2023-08-02 LAB — LIPID PANEL
Cholesterol: 132 mg/dL (ref 0–200)
HDL: 60.6 mg/dL (ref >39.00)
LDL Cholesterol: 55 mg/dL (ref 0–99)
NonHDL: 71.32
Total CHOL/HDL Ratio: 2
Triglycerides: 82 mg/dL (ref 0.0–149.0)
VLDL: 16.4 mg/dL (ref 0.0–40.0)

## 2023-08-02 LAB — BASIC METABOLIC PANEL
BUN: 12 mg/dL (ref 6–23)
CO2: 30 meq/L (ref 19–32)
Calcium: 9.4 mg/dL (ref 8.4–10.5)
Chloride: 102 meq/L (ref 96–112)
Creatinine, Ser: 0.77 mg/dL (ref 0.40–1.20)
GFR: 77.88 mL/min (ref >60.00)
Glucose, Bld: 115 mg/dL — ABNORMAL HIGH (ref 70–99)
Potassium: 4.2 meq/L (ref 3.5–5.1)
Sodium: 140 meq/L (ref 135–145)

## 2023-08-02 LAB — HEMOGLOBIN A1C: Hgb A1c MFr Bld: 5.9 % (ref 4.6–6.5)

## 2023-08-02 LAB — HEPATIC FUNCTION PANEL
ALT: 12 U/L (ref 0–35)
AST: 17 U/L (ref 0–37)
Albumin: 4.1 g/dL (ref 3.5–5.2)
Alkaline Phosphatase: 66 U/L (ref 39–117)
Bilirubin, Direct: 0.1 mg/dL (ref 0.0–0.3)
Total Bilirubin: 0.4 mg/dL (ref 0.2–1.2)
Total Protein: 6.8 g/dL (ref 6.0–8.3)

## 2023-08-02 NOTE — Assessment & Plan Note (Signed)
Continue xarelto.  Continue cardizem CD 120mg  q day and toprol q day.  Appears to be doing well on these medications.  Has diltiazem 30mg  prn to take if needed.  Reports stable.

## 2023-08-02 NOTE — Assessment & Plan Note (Signed)
On Crestor.  Low cholesterol diet and exercise.  Follow lipid panel and liver function tests.   

## 2023-08-02 NOTE — Progress Notes (Signed)
Subjective:    Patient ID: Allison [name], female    DOB: 1951-01-07, 72 y.o.   MRN: [id number]  Patient here for  Chief Complaint  Patient presents with   Medical Management of Chronic Issues    HPI Here for scheduled follow up - f/u regarding afib (on xarelto) and hypertension. She is accompanied by her daughter.  History obtained from both of them. Saw cardiology 04/20/23 - recommended no change in medication. Continue diltiazem 30mg  prn. She reports she is doing well from a cardiac standpoint.  No increased heart rate or palpitations. No chest pain.  Breathing stable. Saw endocrinology 05/15/23 - thyroid mass. Ultrasound thyroid 06/08/23 - 3.6cm solid nodule.  Also left sided 1.0 cm nodule. Recommended FNA of the 3.6 cm mass. Ultrasound guided biopsy 07/05/23. Trying to coordinate f/u.  Unsure of results.  Eating.  No nausea or vomiting.  No bowel issues reported.  Discussed her memory and repeating herself.  Neurology referral was placed.  Trying to coordinate appt.    Past Medical History:  Diagnosis Date   A-fib [facility])    GERD (gastroesophageal reflux disease)    Hypercholesterolemia    Hypertension    Migraine headache    Psoriasis    Past Surgical History:  Procedure Laterality Date   COLONOSCOPY WITH PROPOFOL N/A 12/23/2016   Procedure: COLONOSCOPY WITH PROPOFOL;  Surgeon: Christena Deem, MD;  Location: [facility] ENDOSCOPY;  Service: Endoscopy;  Laterality: N/A;   DILATION AND CURETTAGE OF UTERUS     history of abnormal bleeding   TUBAL LIGATION     Family History  Problem Relation Age of Onset   Asthma Father    Diabetes Mother    CVA Mother    Psoriasis Mother    Breast cancer Maternal Grandmother    Breast cancer Paternal Grandmother    Colon cancer Maternal Uncle    Colon cancer Other        nephew   Diabetes Other        multiple relatives (both sides)   Stroke Brother        Light stroke   Diabetes Brother    Social History   Socioeconomic History    Marital status: Single    Spouse name: Not on file   Number of children: 1   Years of education: Not on file   Highest education level: Not on file  Occupational History   Not on file  Tobacco Use   Smoking status: Never   Smokeless tobacco: Never  Vaping Use   Vaping status: Never Used  Substance and Sexual Activity   Alcohol use: No    Alcohol/week: 0.0 standard drinks of alcohol   Drug use: No   Sexual activity: Not on file  Other Topics Concern   Not on file  Social History Narrative   Not on file   Social Determinants of Health   Financial Resource Strain: Low Risk  (02/20/2023)   Overall Financial Resource Strain (CARDIA)    Difficulty of Paying Living Expenses: Not hard at all  Food Insecurity: No Food Insecurity (02/20/2023)   Hunger Vital Sign    Worried About Running Out of Food in the Last Year: Never true    Ran Out of Food in the Last Year: Never true  Transportation Needs: No Transportation Needs (02/20/2023)   PRAPARE - Administrator, Civil Service (Medical): No    Lack of Transportation (Non-Medical): No  Physical Activity: Sufficiently Active (02/20/2023)  Exercise Vital Sign    Days of Exercise per Week: 5 days    Minutes of Exercise per Session: 30 min  Stress: No Stress Concern Present (02/20/2023)   [employer] of Occupational Health - Occupational Stress Questionnaire    Feeling of Stress : Not at all  Social Connections: Unknown (02/20/2023)   Social Connection and Isolation Panel [NHANES]    Frequency of Communication with Friends and Family: More than three times a week    Frequency of Social Gatherings with Friends and Family: More than three times a week    Attends Religious Services: Not on Marketing executive or Organizations: Not on file    Attends Banker Meetings: Not on file    Marital Status: Not on file     Review of Systems  Constitutional:  Negative for appetite change and unexpected weight  change.  HENT:  Negative for congestion and sinus pressure.   Respiratory:  Negative for cough, chest tightness and shortness of breath.   Cardiovascular:  Negative for chest pain and palpitations.  Gastrointestinal:  Negative for abdominal pain, diarrhea, nausea and vomiting.  Genitourinary:  Negative for difficulty urinating and dysuria.  Musculoskeletal:  Negative for joint swelling and myalgias.  Skin:  Negative for color change and rash.  Neurological:  Negative for dizziness and headaches.  Psychiatric/Behavioral:  Negative for agitation and dysphoric mood.        Objective:     BP 124/70   Pulse 75   Temp 98 F (36.7 C)   Resp 16   Ht 5\' 8"  (1.727 m)   Wt 167 lb 12.8 oz (76.1 kg)   SpO2 97%   BMI 25.51 kg/m  Wt Readings from Last 3 Encounters:  08/02/23 167 lb 12.8 oz (76.1 kg)  05/01/23 168 lb 12.8 oz (76.6 kg)  04/20/23 170 lb (77.1 kg)    Physical Exam Vitals reviewed.  Constitutional:      General: She is not in acute distress.    Appearance: Normal appearance.  HENT:     Head: Normocephalic and atraumatic.     Right Ear: External ear normal.     Left Ear: External ear normal.  Eyes:     General: No scleral icterus.       Right eye: No discharge.        Left eye: No discharge.     Conjunctiva/sclera: Conjunctivae normal.  Neck:     Thyroid: No thyromegaly.  Cardiovascular:     Rate and Rhythm: Normal rate and regular rhythm.  Pulmonary:     Effort: No respiratory distress.     Breath sounds: Normal breath sounds. No wheezing.  Abdominal:     General: Bowel sounds are normal.     Palpations: Abdomen is soft.     Tenderness: There is no abdominal tenderness.  Musculoskeletal:        General: No swelling or tenderness.     Cervical back: Neck supple. No tenderness.  Lymphadenopathy:     Cervical: No cervical adenopathy.  Skin:    Findings: No erythema or rash.  Neurological:     Mental Status: She is alert.  Psychiatric:        Mood and  Affect: Mood normal.        Behavior: Behavior normal.      Outpatient Encounter Medications as of 08/02/2023  Medication Sig   alendronate (FOSAMAX) 70 MG tablet TAKE 1 TABLET BY MOUTH ONCE A  WEEK ON AN EMPTY STOMACH WITH A FULL GLASS OF WATER   ALPRAZolam (XANAX) 0.25 MG tablet TAKE 1 TABLET BY MOUTH ONCE DAILY AS NEEDED FOR ANXIETY   Calcium Carb-Cholecalciferol (CALCIUM 500/VITAMIN D PO) Take 2,000 Units by mouth daily.   diltiazem (CARDIZEM CD) 120 MG 24 hr capsule Take 1 capsule by mouth once daily   diltiazem (CARDIZEM) 30 MG tablet TAKE 1 TABLET BY MOUTH THREE TIMES DAILY AS NEEDED FOR  TACHYCARDIA/  ATRIAL  FIB   metoprolol succinate (TOPROL-XL) 50 MG 24 hr tablet TAKE ONE AND ONE-HALF TABLETS BY MOUTH ONCE DAILY WITH OR IMMEDIATELY FOLLOWING A MEAL   omeprazole (PRILOSEC) 20 MG capsule Take 1 capsule by mouth once daily   potassium chloride (KLOR-CON) 10 MEQ tablet Take 1 tablet (10 mEq total) by mouth daily.   rivaroxaban (XARELTO) 20 MG TABS tablet TAKE 1 TABLET BY MOUTH ONCE DAILY WITH SUPPER   rosuvastatin (CRESTOR) 20 MG tablet Take 1 tablet (20 mg total) by mouth daily.   No facility-administered encounter medications on file as of 08/02/2023.     Lab Results  Component Value Date   WBC 5.7 05/01/2023   HGB 14.3 05/01/2023   HCT 43.9 05/01/2023   PLT 250.0 05/01/2023   GLUCOSE 115 (H) 08/02/2023   CHOL 132 08/02/2023   TRIG 82.0 08/02/2023   HDL 60.60 08/02/2023   LDLCALC 55 08/02/2023   ALT 12 08/02/2023   AST 17 08/02/2023   NA 140 08/02/2023   K 4.2 08/02/2023   CL 102 08/02/2023   CREATININE 0.77 08/02/2023   BUN 12 08/02/2023   CO2 30 08/02/2023   TSH 1.38 05/01/2023   INR 2.3 (H) 12/13/2020   HGBA1C 5.9 08/02/2023    MM 3D SCREEN BREAST BILATERAL  Result Date: 04/20/2023 CLINICAL DATA:  Screening. EXAM: DIGITAL SCREENING BILATERAL MAMMOGRAM WITH TOMOSYNTHESIS AND CAD TECHNIQUE: Bilateral screening digital craniocaudal and mediolateral oblique  mammograms were obtained. Bilateral screening digital breast tomosynthesis was performed. The images were evaluated with computer-aided detection. COMPARISON:  Previous exam(s). ACR Breast Density Category b: There are scattered areas of fibroglandular density. FINDINGS: There are no findings suspicious for malignancy. IMPRESSION: No mammographic evidence of malignancy. A result letter of this screening mammogram will be mailed directly to the patient. RECOMMENDATION: Screening mammogram in one year. (Code:SM-B-01Y) BI-RADS CATEGORY  1: Negative. Electronically Signed   By: Meda Klinefelter M.D.   On: 04/20/2023 13:16       Assessment & Plan:  Hyperglycemia Assessment & Plan: Continue low carb diet and exercise.  Follow met b and a1c.   Orders: -     Hemoglobin A1c  Hypercholesterolemia Assessment & Plan: On Crestor.  Low cholesterol diet and exercise.  Follow lipid panel and liver function tests.    Orders: -     Hepatic function panel -     Lipid panel  Primary hypertension Assessment & Plan: Continue metoprolol and cardizem. Blood pressure doing well.  Follow pressures.  Follow met b.   Orders: -     Basic metabolic panel  Gastroesophageal reflux disease, unspecified whether esophagitis present Assessment & Plan: Controlled on prilosec.    Memory change Assessment & Plan: Discussed with her and her daughter today.  Ms Lafave is not aware of a significant change. Daughter reports some change and some issues with repeating herself.  Have referred to neurology.  Daughter plans to call and coordinate an appt.    Paroxysmal atrial fibrillation [facility]) Assessment & Plan: Continue xarelto.  Continue cardizem CD 120mg  q day and toprol q day.  Appears to be doing well on these medications.  Has diltiazem 30mg  prn to take if needed.  Reports stable.     Thyroid nodule Assessment & Plan: Saw endocrinology 05/15/23 - thyroid mass. Ultrasound thyroid 06/08/23 - 3.6cm solid nodule.   Also left sided 1.0 cm nodule. Recommended FNA of the 3.6 cm mass. Ultrasound guided biopsy 07/05/23. Trying to coordinate f/u.  Unsure of results      Dale [place], MD

## 2023-08-02 NOTE — Assessment & Plan Note (Signed)
Saw endocrinology 05/15/23 - thyroid mass. Ultrasound thyroid 06/08/23 - 3.6cm solid nodule.  Also left sided 1.0 cm nodule. Recommended FNA of the 3.6 cm mass. Ultrasound guided biopsy 07/05/23. Trying to coordinate f/u.  Unsure of results

## 2023-08-02 NOTE — Assessment & Plan Note (Signed)
Discussed with her and her daughter today.  Allison [name] is not aware of a significant change. Daughter reports some change and some issues with repeating herself.  Have referred to neurology.  Daughter plans to call and coordinate an appt.

## 2023-08-02 NOTE — Assessment & Plan Note (Signed)
Controlled on prilosec.   

## 2023-08-02 NOTE — Assessment & Plan Note (Signed)
Continue low carb diet and exercise.  Follow met b and a1c.  

## 2023-08-02 NOTE — Assessment & Plan Note (Signed)
Continue metoprolol and cardizem. Blood pressure doing well.  Follow pressures.  Follow met b.  °

## 2023-08-04 ENCOUNTER — Encounter: Payer: Self-pay | Admitting: Internal Medicine

## 2023-08-10 ENCOUNTER — Ambulatory Visit: Payer: Medicare HMO | Admitting: Internal Medicine

## 2023-08-10 NOTE — Progress Notes (Deleted)
Subjective:    Patient ID: Allison [name], female    DOB: 1951/03/13, 72 y.o.   MRN: [id number]  Patient here for No chief complaint on file.   HPI Work in appt - concern regarding rib pain with coughing.    Past Medical History:  Diagnosis Date   A-fib [facility])    GERD (gastroesophageal reflux disease)    Hypercholesterolemia    Hypertension    Migraine headache    Psoriasis    Past Surgical History:  Procedure Laterality Date   COLONOSCOPY WITH PROPOFOL N/A 12/23/2016   Procedure: COLONOSCOPY WITH PROPOFOL;  Surgeon: Christena Deem, MD;  Location: [facility] ENDOSCOPY;  Service: Endoscopy;  Laterality: N/A;   DILATION AND CURETTAGE OF UTERUS     history of abnormal bleeding   TUBAL LIGATION     Family History  Problem Relation Age of Onset   Asthma Father    Diabetes Mother    CVA Mother    Psoriasis Mother    Breast cancer Maternal Grandmother    Breast cancer Paternal Grandmother    Colon cancer Maternal Uncle    Colon cancer Other        nephew   Diabetes Other        multiple relatives (both sides)   Stroke Brother        Light stroke   Diabetes Brother    Social History   Socioeconomic History   Marital status: Single    Spouse name: Not on file   Number of children: 1   Years of education: Not on file   Highest education level: Not on file  Occupational History   Not on file  Tobacco Use   Smoking status: Never   Smokeless tobacco: Never  Vaping Use   Vaping status: Never Used  Substance and Sexual Activity   Alcohol use: No    Alcohol/week: 0.0 standard drinks of alcohol   Drug use: No   Sexual activity: Not on file  Other Topics Concern   Not on file  Social History Narrative   Not on file   Social Determinants of Health   Financial Resource Strain: Low Risk  (02/20/2023)   Overall Financial Resource Strain (CARDIA)    Difficulty of Paying Living Expenses: Not hard at all  Food Insecurity: No Food Insecurity (02/20/2023)   Hunger  Vital Sign    Worried About Running Out of Food in the Last Year: Never true    Ran Out of Food in the Last Year: Never true  Transportation Needs: No Transportation Needs (02/20/2023)   PRAPARE - Administrator, Civil Service (Medical): No    Lack of Transportation (Non-Medical): No  Physical Activity: Sufficiently Active (02/20/2023)   Exercise Vital Sign    Days of Exercise per Week: 5 days    Minutes of Exercise per Session: 30 min  Stress: No Stress Concern Present (02/20/2023)   [employer] of Occupational Health - Occupational Stress Questionnaire    Feeling of Stress : Not at all  Social Connections: Unknown (02/20/2023)   Social Connection and Isolation Panel [NHANES]    Frequency of Communication with Friends and Family: More than three times a week    Frequency of Social Gatherings with Friends and Family: More than three times a week    Attends Religious Services: Not on file    Active Member of Clubs or Organizations: Not on file    Attends Banker Meetings: Not on file  Marital Status: Not on file     Review of Systems     Objective:     There were no vitals taken for this visit. Wt Readings from Last 3 Encounters:  08/02/23 167 lb 12.8 oz (76.1 kg)  05/01/23 168 lb 12.8 oz (76.6 kg)  04/20/23 170 lb (77.1 kg)    Physical Exam   Outpatient Encounter Medications as of 08/10/2023  Medication Sig   alendronate (FOSAMAX) 70 MG tablet TAKE 1 TABLET BY MOUTH ONCE A WEEK ON AN EMPTY STOMACH WITH A FULL GLASS OF WATER   ALPRAZolam (XANAX) 0.25 MG tablet TAKE 1 TABLET BY MOUTH ONCE DAILY AS NEEDED FOR ANXIETY   Calcium Carb-Cholecalciferol (CALCIUM 500/VITAMIN D PO) Take 2,000 Units by mouth daily.   diltiazem (CARDIZEM CD) 120 MG 24 hr capsule Take 1 capsule by mouth once daily   diltiazem (CARDIZEM) 30 MG tablet TAKE 1 TABLET BY MOUTH THREE TIMES DAILY AS NEEDED FOR  TACHYCARDIA/  ATRIAL  FIB   metoprolol succinate (TOPROL-XL) 50 MG  24 hr tablet TAKE ONE AND ONE-HALF TABLETS BY MOUTH ONCE DAILY WITH OR IMMEDIATELY FOLLOWING A MEAL   omeprazole (PRILOSEC) 20 MG capsule Take 1 capsule by mouth once daily   potassium chloride (KLOR-CON) 10 MEQ tablet Take 1 tablet (10 mEq total) by mouth daily.   rivaroxaban (XARELTO) 20 MG TABS tablet TAKE 1 TABLET BY MOUTH ONCE DAILY WITH SUPPER   rosuvastatin (CRESTOR) 20 MG tablet Take 1 tablet (20 mg total) by mouth daily.   No facility-administered encounter medications on file as of 08/10/2023.     Lab Results  Component Value Date   WBC 5.7 05/01/2023   HGB 14.3 05/01/2023   HCT 43.9 05/01/2023   PLT 250.0 05/01/2023   GLUCOSE 115 (H) 08/02/2023   CHOL 132 08/02/2023   TRIG 82.0 08/02/2023   HDL 60.60 08/02/2023   LDLCALC 55 08/02/2023   ALT 12 08/02/2023   AST 17 08/02/2023   NA 140 08/02/2023   K 4.2 08/02/2023   CL 102 08/02/2023   CREATININE 0.77 08/02/2023   BUN 12 08/02/2023   CO2 30 08/02/2023   TSH 1.38 05/01/2023   INR 2.3 (H) 12/13/2020   HGBA1C 5.9 08/02/2023    MM 3D SCREEN BREAST BILATERAL  Result Date: 04/20/2023 CLINICAL DATA:  Screening. EXAM: DIGITAL SCREENING BILATERAL MAMMOGRAM WITH TOMOSYNTHESIS AND CAD TECHNIQUE: Bilateral screening digital craniocaudal and mediolateral oblique mammograms were obtained. Bilateral screening digital breast tomosynthesis was performed. The images were evaluated with computer-aided detection. COMPARISON:  Previous exam(s). ACR Breast Density Category b: There are scattered areas of fibroglandular density. FINDINGS: There are no findings suspicious for malignancy. IMPRESSION: No mammographic evidence of malignancy. A result letter of this screening mammogram will be mailed directly to the patient. RECOMMENDATION: Screening mammogram in one year. (Code:SM-B-01Y) BI-RADS CATEGORY  1: Negative. Electronically Signed   By: Meda Klinefelter M.D.   On: 04/20/2023 13:16       Assessment & Plan:  There are no diagnoses  linked to this encounter.   Dale [place], MD

## 2023-08-14 ENCOUNTER — Encounter: Payer: Self-pay | Admitting: Family Medicine

## 2023-08-14 ENCOUNTER — Ambulatory Visit: Payer: Medicare HMO | Admitting: Family Medicine

## 2023-08-14 VITALS — BP 116/70 | HR 57 | Temp 98.0°F | Resp 16 | Ht 68.0 in | Wt 166.4 lb

## 2023-08-14 DIAGNOSIS — R059 Cough, unspecified: Secondary | ICD-10-CM | POA: Diagnosis not present

## 2023-08-14 DIAGNOSIS — K219 Gastro-esophageal reflux disease without esophagitis: Secondary | ICD-10-CM | POA: Diagnosis not present

## 2023-08-14 MED ORDER — FEXOFENADINE HCL 180 MG PO TABS
180.0000 mg | ORAL_TABLET | Freq: Every day | ORAL | 2 refills | Status: DC
Start: 2023-08-14 — End: 2023-12-05

## 2023-08-14 MED ORDER — DEXTROMETHORPHAN HBR 15 MG/5ML PO SYRP
10.0000 mL | ORAL_SOLUTION | Freq: Four times a day (QID) | ORAL | 0 refills | Status: DC | PRN
Start: 2023-08-14 — End: 2023-12-05

## 2023-08-14 MED ORDER — FLUTICASONE PROPIONATE 50 MCG/ACT NA SUSP
2.0000 | Freq: Every day | NASAL | 6 refills | Status: DC
Start: 2023-08-14 — End: 2024-07-18

## 2023-08-14 NOTE — Progress Notes (Unsigned)
SUBJECTIVE:   Chief Complaint  Patient presents with   Cough    Worse at night Coughing so hard throwing up mucus. Going on 2 weeks    HPI Presents to clinic for acute visit  Discussed the use of AI scribe software for clinical note transcription with the patient, who gave verbal consent to proceed.  History of Present Illness The patient, with a history of heart disease and acid reflux, presents with a two-week history of chest congestion and cough. The symptoms began suddenly after a day of shopping with family. The cough is severe enough to cause chest pain and the patient expressed concern about the possibility of breaking a rib due to the intensity of the cough. The patient has been self-treating with mentholated cough drops, which provide some relief.  The cough is worse at night, causing the patient to wake up due to chest discomfort. Upon waking, the patient is able to expectorate a small amount of clear to yellowish phlegm. The patient denies any associated fever, difficulty swallowing, shortness of breath, or chest pain unrelated to the cough. There is no associated rhinorrhea or postnasal drip. The patient's appetite and hydration status are good, and there is no associated nausea or vomiting.  The patient denies any recent exposure to sick contacts and does not believe they have contracted a viral illness. They have received their annual flu shot and are due for a COVID-19 booster shot in December. The patient denies any recent weight loss.  The patient's current medications include diltiazem and metoprolol for heart disease, and they have a history of taking Prilosec for acid reflux. The patient has not been taking Prilosec recently, but has a history of acid reflux.  The patient reports that the cough and congestion are gradually improving. However, they continue to experience nocturnal coughing spells and the need to expectorate phlegm. The patient denies any worsening of  symptoms or development of new symptoms.     PERTINENT PMH / PSH: As above  OBJECTIVE:  BP 116/70   Pulse (!) 57   Temp 98 F (36.7 C)   Resp 16   Ht 5\' 8"  (1.727 m)   Wt 166 lb 6 oz (75.5 kg)   SpO2 99%   BMI 25.30 kg/m    Physical Exam Vitals reviewed.  Constitutional:      General: She is not in acute distress.    Appearance: She is not ill-appearing.  HENT:     Head: Normocephalic.     Right Ear: Tympanic membrane, ear canal and external ear normal.     Left Ear: Tympanic membrane, ear canal and external ear normal.     Nose: Nose normal.     Mouth/Throat:     Mouth: Mucous membranes are moist.  Eyes:     Extraocular Movements: Extraocular movements intact.     Conjunctiva/sclera: Conjunctivae normal.     Pupils: Pupils are equal, round, and reactive to light.  Neck:     Thyroid: No thyromegaly or thyroid tenderness.     Vascular: No carotid bruit.  Cardiovascular:     Rate and Rhythm: Normal rate and regular rhythm.     Pulses: Normal pulses.     Heart sounds: Normal heart sounds.  Pulmonary:     Effort: Pulmonary effort is normal.     Breath sounds: Normal breath sounds. No stridor. No wheezing or rhonchi.  Abdominal:     General: Bowel sounds are normal. There is no distension.  Palpations: Abdomen is soft.     Tenderness: There is no abdominal tenderness. There is no right CVA tenderness, left CVA tenderness, guarding or rebound.  Musculoskeletal:     Right lower leg: No edema.     Left lower leg: No edema.  Lymphadenopathy:     Cervical: No cervical adenopathy.  Skin:    General: Skin is warm.     Capillary Refill: Capillary refill takes less than 2 seconds.  Neurological:     General: No focal deficit present.     Mental Status: She is alert and oriented to person, place, and time. Mental status is at baseline.     Motor: No weakness.  Psychiatric:        Mood and Affect: Mood normal.        Behavior: Behavior normal.        Thought  Content: Thought content normal.        Judgment: Judgment normal.        02/20/2023   10:38 AM 02/16/2023   11:31 AM 10/27/2022   11:49 AM 06/23/2022    3:10 PM 03/23/2022    8:36 AM  Depression screen PHQ 2/9  Decreased Interest 0 0 0 0 0  Down, Depressed, Hopeless 0 0 0 0 0  PHQ - 2 Score 0 0 0 0 0       No data to display          ASSESSMENT/PLAN:  Cough in adult Assessment & Plan: Two-week history of chest congestion and cough, particularly at night. No fever, dyspnea, or chest pain. Minimal clear sputum production. No nasal symptoms. No exposure to sick contacts. Physical exam unremarkable with clear lungs and no wheezing or crackles. Possible postnasal drip contributing to cough. -Start Flonase nightly. -Consider starting Prilosec for possible silent GERD. -If symptoms worsen or do not improve, consider chest x-ray. -Encourage hydration and use of humidifier.  Orders: -     Fluticasone Propionate; Place 2 sprays into both nostrils daily.  Dispense: 16 g; Refill: 6 -     Fexofenadine HCl; Take 1 tablet (180 mg total) by mouth daily.  Dispense: 30 tablet; Refill: 2 -     Dextromethorphan HBr; Take 10 mLs (30 mg total) by mouth 4 (four) times daily as needed for cough.  Dispense: 120 mL; Refill: 0  Gastroesophageal reflux disease, unspecified whether esophagitis present Assessment & Plan: History of acid reflux, unclear if currently on Prilosec. -If not currently taking, start Prilosec.     PDMP reviewed  Return if symptoms worsen or fail to improve, for PCP.  Dana Allan, MD

## 2023-08-14 NOTE — Patient Instructions (Signed)
It was a pleasure meeting you today. Thank you for allowing me to take part in your health care.  Our goals for today as we discussed include:  Start Flonase 2 sprays to each nostril at night Start Allegra 180 mg daily  Start Dextromethorphan 10 mls every 6 hours as needed for cough Increase water intake Humidified air  If not taking Pepcid recommend restarting 20 mg at night  If develops worsening symptoms, fevers, productive cough, shortness of breath, wheezing, notify MD or go to ED.   If you have any questions or concerns, please do not hesitate to call the office at [phone].  I look forward to our next visit and until then take care and stay safe.  Regards,   Dana Allan, MD   [facility]

## 2023-08-16 ENCOUNTER — Telehealth: Payer: Self-pay

## 2023-08-16 ENCOUNTER — Encounter: Payer: Self-pay | Admitting: Family Medicine

## 2023-08-16 DIAGNOSIS — R059 Cough, unspecified: Secondary | ICD-10-CM | POA: Insufficient documentation

## 2023-08-16 NOTE — Assessment & Plan Note (Signed)
Two-week history of chest congestion and cough, particularly at night. No fever, dyspnea, or chest pain. Minimal clear sputum production. No nasal symptoms. No exposure to sick contacts. Physical exam unremarkable with clear lungs and no wheezing or crackles. Possible postnasal drip contributing to cough. -Start Flonase nightly. -Consider starting Prilosec for possible silent GERD. -If symptoms worsen or do not improve, consider chest x-ray. -Encourage hydration and use of humidifier.

## 2023-08-16 NOTE — Assessment & Plan Note (Signed)
History of acid reflux, unclear if currently on Prilosec. -If not currently taking, start Prilosec.

## 2023-08-16 NOTE — Patient Outreach (Signed)
  Care Coordination   08/16/2023 Name: Allison [name] MRN: [id number] DOB: [birth year]   Care Coordination Outreach Attempts:  An unsuccessful telephone outreach was attempted today to offer the patient information about available care coordination services.  Unable to reach patient or leave voice mail due to message stating voicemail box not set up.   Follow Up Plan:  Additional outreach attempts will be made to offer the patient care coordination information and services.   Encounter Outcome:  No Answer   Care Coordination Interventions:  No, not indicated    George Ina [facility] Care Coordination [phone] direct line

## 2023-09-01 ENCOUNTER — Other Ambulatory Visit: Payer: Self-pay | Admitting: Cardiovascular Disease

## 2023-10-09 ENCOUNTER — Other Ambulatory Visit: Payer: Self-pay

## 2023-10-27 ENCOUNTER — Other Ambulatory Visit: Payer: Self-pay | Admitting: Cardiovascular Disease

## 2023-10-27 DIAGNOSIS — I480 Paroxysmal atrial fibrillation: Secondary | ICD-10-CM

## 2023-10-27 NOTE — Telephone Encounter (Signed)
 Prescription refill request for Xarelto received.  Indication:afib Last office visit:6/24 Weight:75.5  kg Age:73 Scr:0.77  10/24 CrCl:79.87  ml/min  Prescription refilled

## 2023-11-27 ENCOUNTER — Telehealth: Payer: Self-pay | Admitting: Internal Medicine

## 2023-11-27 DIAGNOSIS — R739 Hyperglycemia, unspecified: Secondary | ICD-10-CM

## 2023-11-27 DIAGNOSIS — E7800 Pure hypercholesterolemia, unspecified: Secondary | ICD-10-CM

## 2023-11-27 NOTE — Telephone Encounter (Signed)
 Patient need lab orders.

## 2023-11-30 NOTE — Addendum Note (Signed)
 Addended by: Victorino Grates D on: 11/30/2023 10:53 AM   Modules accepted: Orders

## 2023-12-05 ENCOUNTER — Other Ambulatory Visit (INDEPENDENT_AMBULATORY_CARE_PROVIDER_SITE_OTHER): Payer: Medicare HMO

## 2023-12-05 DIAGNOSIS — E7800 Pure hypercholesterolemia, unspecified: Secondary | ICD-10-CM | POA: Diagnosis not present

## 2023-12-05 DIAGNOSIS — R739 Hyperglycemia, unspecified: Secondary | ICD-10-CM

## 2023-12-05 LAB — LIPID PANEL
Cholesterol: 159 mg/dL (ref 0–200)
HDL: 61.9 mg/dL (ref >39.00)
LDL Cholesterol: 77 mg/dL (ref 0–99)
NonHDL: 97.13
Total CHOL/HDL Ratio: 3
Triglycerides: 99 mg/dL (ref 0.0–149.0)
VLDL: 19.8 mg/dL (ref 0.0–40.0)

## 2023-12-05 LAB — HEPATIC FUNCTION PANEL
ALT: 15 U/L (ref 0–35)
AST: 21 U/L (ref 0–37)
Albumin: 4.1 g/dL (ref 3.5–5.2)
Alkaline Phosphatase: 62 U/L (ref 39–117)
Bilirubin, Direct: 0.1 mg/dL (ref 0.0–0.3)
Total Bilirubin: 0.5 mg/dL (ref 0.2–1.2)
Total Protein: 7.1 g/dL (ref 6.0–8.3)

## 2023-12-05 LAB — BASIC METABOLIC PANEL
BUN: 11 mg/dL (ref 6–23)
CO2: 31 meq/L (ref 19–32)
Calcium: 9.1 mg/dL (ref 8.4–10.5)
Chloride: 103 meq/L (ref 96–112)
Creatinine, Ser: 0.69 mg/dL (ref 0.40–1.20)
GFR: 87.41 mL/min (ref >60.00)
Glucose, Bld: 109 mg/dL — ABNORMAL HIGH (ref 70–99)
Potassium: 4.3 meq/L (ref 3.5–5.1)
Sodium: 142 meq/L (ref 135–145)

## 2023-12-05 LAB — HEMOGLOBIN A1C: Hgb A1c MFr Bld: 6 % (ref 4.6–6.5)

## 2023-12-07 ENCOUNTER — Ambulatory Visit: Payer: Medicare HMO | Admitting: Internal Medicine

## 2023-12-07 NOTE — Progress Notes (Deleted)
 Subjective:    Patient ID: Allison [name], female    DOB: Feb 26, 1951, 73 y.o.   MRN: [id number]  Patient here for No chief complaint on file.   HPI Here for a scheduled follow up -  f/u regarding afib (on xarelto) and hypertension. She is accompanied by her daughter.  History obtained from both of them. Saw cardiology 04/20/23 - recommended no change in medication. Continue diltiazem 30mg  prn. Saw endocrinology 05/15/23 - thyroid mass. Ultrasound thyroid 06/08/23 - 3.6cm solid nodule. Also left sided 1.0 cm nodule. Recommended FNA of the 3.6 cm mass. Ultrasound guided biopsy 07/05/23.    Past Medical History:  Diagnosis Date   A-fib [facility])    GERD (gastroesophageal reflux disease)    Hypercholesterolemia    Hypertension    Migraine headache    Psoriasis    Past Surgical History:  Procedure Laterality Date   COLONOSCOPY WITH PROPOFOL N/A 12/23/2016   Procedure: COLONOSCOPY WITH PROPOFOL;  Surgeon: Christena Deem, MD;  Location: [facility] ENDOSCOPY;  Service: Endoscopy;  Laterality: N/A;   DILATION AND CURETTAGE OF UTERUS     history of abnormal bleeding   TUBAL LIGATION     Family History  Problem Relation Age of Onset   Asthma Father    Diabetes Mother    CVA Mother    Psoriasis Mother    Breast cancer Maternal Grandmother    Breast cancer Paternal Grandmother    Colon cancer Maternal Uncle    Colon cancer Other        nephew   Diabetes Other        multiple relatives (both sides)   Stroke Brother        Light stroke   Diabetes Brother    Social History   Socioeconomic History   Marital status: Single    Spouse name: Not on file   Number of children: 1   Years of education: Not on file   Highest education level: Not on file  Occupational History   Not on file  Tobacco Use   Smoking status: Never   Smokeless tobacco: Never  Vaping Use   Vaping status: Never Used  Substance and Sexual Activity   Alcohol use: No    Alcohol/week: 0.0 standard drinks of alcohol    Drug use: No   Sexual activity: Not on file  Other Topics Concern   Not on file  Social History Narrative   Not on file   Social Drivers of Health   Financial Resource Strain: Low Risk  (02/20/2023)   Overall Financial Resource Strain (CARDIA)    Difficulty of Paying Living Expenses: Not hard at all  Food Insecurity: No Food Insecurity (02/20/2023)   Hunger Vital Sign    Worried About Running Out of Food in the Last Year: Never true    Ran Out of Food in the Last Year: Never true  Transportation Needs: No Transportation Needs (02/20/2023)   PRAPARE - Administrator, Civil Service (Medical): No    Lack of Transportation (Non-Medical): No  Physical Activity: Sufficiently Active (02/20/2023)   Exercise Vital Sign    Days of Exercise per Week: 5 days    Minutes of Exercise per Session: 30 min  Stress: No Stress Concern Present (02/20/2023)   [employer] of Occupational Health - Occupational Stress Questionnaire    Feeling of Stress : Not at all  Social Connections: Unknown (02/20/2023)   Social Connection and Isolation Panel [NHANES]    Frequency  of Communication with Friends and Family: More than three times a week    Frequency of Social Gatherings with Friends and Family: More than three times a week    Attends Religious Services: Not on Marketing executive or Organizations: Not on file    Attends Banker Meetings: Not on file    Marital Status: Not on file     Review of Systems     Objective:     There were no vitals taken for this visit. Wt Readings from Last 3 Encounters:  08/14/23 166 lb 6 oz (75.5 kg)  08/02/23 167 lb 12.8 oz (76.1 kg)  05/01/23 168 lb 12.8 oz (76.6 kg)    Physical Exam  {Perform Simple Foot Exam  Perform Detailed exam:1} {Insert foot Exam (Optional):30965}   Outpatient Encounter Medications as of 12/07/2023  Medication Sig   alendronate (FOSAMAX) 70 MG tablet TAKE 1 TABLET BY MOUTH ONCE A WEEK ON AN  EMPTY STOMACH WITH A FULL GLASS OF WATER   ALPRAZolam (XANAX) 0.25 MG tablet TAKE 1 TABLET BY MOUTH ONCE DAILY AS NEEDED FOR ANXIETY   Calcium Carb-Cholecalciferol (CALCIUM 500/VITAMIN D PO) Take 2,000 Units by mouth daily.   diltiazem (CARDIZEM CD) 120 MG 24 hr capsule Take 1 capsule by mouth once daily   diltiazem (CARDIZEM) 30 MG tablet TAKE 1 TABLET BY MOUTH THREE TIMES DAILY AS NEEDED FOR  TACHYCARDIA/  ATRIAL  FIB   fluticasone (FLONASE) 50 MCG/ACT nasal spray Place 2 sprays into both nostrils daily.   FLUZONE HIGH-DOSE 0.5 ML injection Inject 0.5 mLs into the muscle once.   metoprolol succinate (TOPROL-XL) 50 MG 24 hr tablet TAKE ONE AND ONE-HALF TABLETS BY MOUTH ONCE DAILY WITH OR IMMEDIATELY FOLLOWING A MEAL   omeprazole (PRILOSEC) 20 MG capsule Take 1 capsule by mouth once daily   potassium chloride (KLOR-CON) 10 MEQ tablet Take 1 tablet (10 mEq total) by mouth daily.   rivaroxaban (XARELTO) 20 MG TABS tablet TAKE 1 TABLET BY MOUTH ONCE DAILY WITH SUPPER   rosuvastatin (CRESTOR) 20 MG tablet Take 1 tablet (20 mg total) by mouth daily.   [DISCONTINUED] dextromethorphan 15 MG/5ML syrup Take 10 mLs (30 mg total) by mouth 4 (four) times daily as needed for cough.   [DISCONTINUED] fexofenadine (ALLEGRA ALLERGY) 180 MG tablet Take 1 tablet (180 mg total) by mouth daily.   No facility-administered encounter medications on file as of 12/07/2023.     Lab Results  Component Value Date   WBC 5.7 05/01/2023   HGB 14.3 05/01/2023   HCT 43.9 05/01/2023   PLT 250.0 05/01/2023   GLUCOSE 109 (H) 12/05/2023   CHOL 159 12/05/2023   TRIG 99.0 12/05/2023   HDL 61.90 12/05/2023   LDLCALC 77 12/05/2023   ALT 15 12/05/2023   AST 21 12/05/2023   NA 142 12/05/2023   K 4.3 12/05/2023   CL 103 12/05/2023   CREATININE 0.69 12/05/2023   BUN 11 12/05/2023   CO2 31 12/05/2023   TSH 1.38 05/01/2023   INR 2.3 (H) 12/13/2020   HGBA1C 6.0 12/05/2023    MM 3D SCREEN BREAST BILATERAL Result Date:  04/20/2023 CLINICAL DATA:  Screening. EXAM: DIGITAL SCREENING BILATERAL MAMMOGRAM WITH TOMOSYNTHESIS AND CAD TECHNIQUE: Bilateral screening digital craniocaudal and mediolateral oblique mammograms were obtained. Bilateral screening digital breast tomosynthesis was performed. The images were evaluated with computer-aided detection. COMPARISON:  Previous exam(s). ACR Breast Density Category b: There are scattered areas of fibroglandular density. FINDINGS:  There are no findings suspicious for malignancy. IMPRESSION: No mammographic evidence of malignancy. A result letter of this screening mammogram will be mailed directly to the patient. RECOMMENDATION: Screening mammogram in one year. (Code:SM-B-01Y) BI-RADS CATEGORY  1: Negative. Electronically Signed   By: Meda Klinefelter M.D.   On: 04/20/2023 13:16       Assessment & Plan:  There are no diagnoses linked to this encounter.   Dale Franklin, MD

## 2024-01-09 ENCOUNTER — Telehealth: Payer: Self-pay

## 2024-01-09 NOTE — Telephone Encounter (Signed)
 Spoke to pt's daughter Merry Proud. Stated that pt has rescheduled multiple appointments for many different reasons. Next appointment scheduled for Dr. Clelia Croft is May 22nd at 2:30. Pt wants to know if a letter can be provided to remind pt that its important to make all appointments and keep them as scheduled.

## 2024-01-10 NOTE — Telephone Encounter (Signed)
 LETTER MAILED

## 2024-01-10 NOTE — Telephone Encounter (Signed)
 Letter printed.

## 2024-01-21 ENCOUNTER — Other Ambulatory Visit: Payer: Self-pay | Admitting: Internal Medicine

## 2024-01-24 ENCOUNTER — Other Ambulatory Visit: Payer: Self-pay | Admitting: Internal Medicine

## 2024-01-30 ENCOUNTER — Other Ambulatory Visit: Payer: Self-pay | Admitting: Cardiovascular Disease

## 2024-02-06 ENCOUNTER — Other Ambulatory Visit: Payer: Self-pay | Admitting: Cardiovascular Disease

## 2024-02-28 NOTE — Progress Notes (Deleted)
 Subjective:    Patient ID: Allison [name], female    DOB: 1951-01-02, 73 y.o.   MRN: [id number]  Patient here for No chief complaint on file.   HPI Here for a scheduled follow up - follow up regarding afib and hypertension. She is accompanied by her daughter. History obtained from both of them.  Saw cardiology 04/20/23 - recommended no change in medication. Continue cardizem  CD 120mg  q day and toprol  and diltiazem  30mg  prn. Saw endocrinology 05/15/23 - thyroid  mass. Ultrasound thyroid  06/08/23 - 3.6cm solid nodule. Also left sided 1.0 cm nodule. Recommended FNA of the 3.6 cm mass. S/p biopsy 07/05/23. Was referred to neurology last visit - for memory issues. Has appt 03/14/24.    Past Medical History:  Diagnosis Date   A-fib [facility])    GERD (gastroesophageal reflux disease)    Hypercholesterolemia    Hypertension    Migraine headache    Psoriasis    Past Surgical History:  Procedure Laterality Date   COLONOSCOPY WITH PROPOFOL  N/A 12/23/2016   Procedure: COLONOSCOPY WITH PROPOFOL ;  Surgeon: Deveron Fly, MD;  Location: [facility] ENDOSCOPY;  Service: Endoscopy;  Laterality: N/A;   DILATION AND CURETTAGE OF UTERUS     history of abnormal bleeding   TUBAL LIGATION     Family History  Problem Relation Age of Onset   Asthma Father    Diabetes Mother    CVA Mother    Psoriasis Mother    Breast cancer Maternal Grandmother    Breast cancer Paternal Grandmother    Colon cancer Maternal Uncle    Colon cancer Other        nephew   Diabetes Other        multiple relatives (both sides)   Stroke Brother        Light stroke   Diabetes Brother    Social History   Socioeconomic History   Marital status: Single    Spouse name: Not on file   Number of children: 1   Years of education: Not on file   Highest education level: Not on file  Occupational History   Not on file  Tobacco Use   Smoking status: Never   Smokeless tobacco: Never  Vaping Use   Vaping status: Never Used   Substance and Sexual Activity   Alcohol use: No    Alcohol/week: 0.0 standard drinks of alcohol   Drug use: No   Sexual activity: Not on file  Other Topics Concern   Not on file  Social History Narrative   Not on file   Social Drivers of Health   Financial Resource Strain: Low Risk  (02/20/2023)   Overall Financial Resource Strain (CARDIA)    Difficulty of Paying Living Expenses: Not hard at all  Food Insecurity: No Food Insecurity (02/20/2023)   Hunger Vital Sign    Worried About Running Out of Food in the Last Year: Never true    Ran Out of Food in the Last Year: Never true  Transportation Needs: No Transportation Needs (02/20/2023)   PRAPARE - Administrator, Civil Service (Medical): No    Lack of Transportation (Non-Medical): No  Physical Activity: Sufficiently Active (02/20/2023)   Exercise Vital Sign    Days of Exercise per Week: 5 days    Minutes of Exercise per Session: 30 min  Stress: No Stress Concern Present (02/20/2023)   [employer] of Occupational Health - Occupational Stress Questionnaire    Feeling of Stress : Not at  all  Social Connections: Unknown (02/20/2023)   Social Connection and Isolation Panel [NHANES]    Frequency of Communication with Friends and Family: More than three times a week    Frequency of Social Gatherings with Friends and Family: More than three times a week    Attends Religious Services: Not on file    Active Member of Clubs or Organizations: Not on file    Attends Banker Meetings: Not on file    Marital Status: Not on file     Review of Systems     Objective:     There were no vitals taken for this visit. Wt Readings from Last 3 Encounters:  08/14/23 166 lb 6 oz (75.5 kg)  08/02/23 167 lb 12.8 oz (76.1 kg)  05/01/23 168 lb 12.8 oz (76.6 kg)    Physical Exam  {Perform Simple Foot Exam  Perform Detailed exam:1} {Insert foot Exam (Optional):30965}   Outpatient Encounter Medications as of  02/29/2024  Medication Sig   alendronate  (FOSAMAX ) 70 MG tablet TAKE 1 TABLET BY MOUTH ONCE A WEEK ON AN EMPTY STOMACH WITH A FULL GLASS OF WATER   ALPRAZolam  (XANAX ) 0.25 MG tablet TAKE 1 TABLET BY MOUTH ONCE DAILY AS NEEDED FOR ANXIETY   Calcium  Carb-Cholecalciferol (CALCIUM  500/VITAMIN D PO) Take 2,000 Units by mouth daily.   diltiazem  (CARDIZEM  CD) 120 MG 24 hr capsule Take 1 capsule by mouth once daily   diltiazem  (CARDIZEM ) 30 MG tablet TAKE 1 TABLET BY MOUTH THREE TIMES DAILY AS NEEDED FOR  TACHYCARDIA/  ATRIAL  FIB   fluticasone  (FLONASE ) 50 MCG/ACT nasal spray Place 2 sprays into both nostrils daily.   FLUZONE HIGH-DOSE 0.5 ML injection Inject 0.5 mLs into the muscle once.   metoprolol  succinate (TOPROL -XL) 50 MG 24 hr tablet TAKE ONE AND ONE-HALF TABLETS BY MOUTH ONCE DAILY WITH  OR  IMMEDIATELY  FOLLOWING  A  MEAL   omeprazole  (PRILOSEC) 20 MG capsule Take 1 capsule by mouth once daily   potassium chloride  (KLOR-CON ) 10 MEQ tablet Take 1 tablet (10 mEq total) by mouth daily.   rivaroxaban  (XARELTO ) 20 MG TABS tablet TAKE 1 TABLET BY MOUTH ONCE DAILY WITH SUPPER   rosuvastatin  (CRESTOR ) 10 MG tablet Take 1 tablet by mouth once daily   rosuvastatin  (CRESTOR ) 20 MG tablet Take 1 tablet (20 mg total) by mouth daily.   No facility-administered encounter medications on file as of 02/29/2024.     Lab Results  Component Value Date   WBC 5.7 05/01/2023   HGB 14.3 05/01/2023   HCT 43.9 05/01/2023   PLT 250.0 05/01/2023   GLUCOSE 109 (H) 12/05/2023   CHOL 159 12/05/2023   TRIG 99.0 12/05/2023   HDL 61.90 12/05/2023   LDLCALC 77 12/05/2023   ALT 15 12/05/2023   AST 21 12/05/2023   NA 142 12/05/2023   K 4.3 12/05/2023   CL 103 12/05/2023   CREATININE 0.69 12/05/2023   BUN 11 12/05/2023   CO2 31 12/05/2023   TSH 1.38 05/01/2023   INR 2.3 (H) 12/13/2020   HGBA1C 6.0 12/05/2023    MM 3D SCREEN BREAST BILATERAL Result Date: 04/20/2023 CLINICAL DATA:  Screening. EXAM: DIGITAL  SCREENING BILATERAL MAMMOGRAM WITH TOMOSYNTHESIS AND CAD TECHNIQUE: Bilateral screening digital craniocaudal and mediolateral oblique mammograms were obtained. Bilateral screening digital breast tomosynthesis was performed. The images were evaluated with computer-aided detection. COMPARISON:  Previous exam(s). ACR Breast Density Category b: There are scattered areas of fibroglandular density. FINDINGS: There are no findings  suspicious for malignancy. IMPRESSION: No mammographic evidence of malignancy. A result letter of this screening mammogram will be mailed directly to the patient. RECOMMENDATION: Screening mammogram in one year. (Code:SM-B-01Y) BI-RADS CATEGORY  1: Negative. Electronically Signed   By: Clancy Crimes M.D.   On: 04/20/2023 13:16       Assessment & Plan:  There are no diagnoses linked to this encounter.   Dellar Fenton, MD

## 2024-02-29 ENCOUNTER — Ambulatory Visit (INDEPENDENT_AMBULATORY_CARE_PROVIDER_SITE_OTHER): Payer: Medicare HMO | Admitting: Internal Medicine

## 2024-02-29 DIAGNOSIS — R739 Hyperglycemia, unspecified: Secondary | ICD-10-CM

## 2024-02-29 DIAGNOSIS — E7800 Pure hypercholesterolemia, unspecified: Secondary | ICD-10-CM

## 2024-02-29 DIAGNOSIS — I480 Paroxysmal atrial fibrillation: Secondary | ICD-10-CM

## 2024-02-29 DIAGNOSIS — I10 Essential (primary) hypertension: Secondary | ICD-10-CM

## 2024-03-03 ENCOUNTER — Encounter: Payer: Self-pay | Admitting: Internal Medicine

## 2024-03-03 NOTE — Progress Notes (Signed)
 Patient ID: Allison [name], female   DOB: 12-Jun-1951, 73 y.o.   MRN: [id number] Did not show for appt.

## 2024-03-06 ENCOUNTER — Other Ambulatory Visit: Payer: Self-pay | Admitting: Cardiovascular Disease

## 2024-04-15 ENCOUNTER — Other Ambulatory Visit: Payer: Self-pay | Admitting: Internal Medicine

## 2024-04-23 ENCOUNTER — Other Ambulatory Visit: Payer: Self-pay | Admitting: Internal Medicine

## 2024-04-30 ENCOUNTER — Other Ambulatory Visit: Payer: Self-pay | Admitting: Cardiovascular Disease

## 2024-04-30 ENCOUNTER — Other Ambulatory Visit: Payer: Self-pay

## 2024-04-30 DIAGNOSIS — I480 Paroxysmal atrial fibrillation: Secondary | ICD-10-CM

## 2024-04-30 MED ORDER — METOPROLOL SUCCINATE ER 50 MG PO TB24: ORAL_TABLET | ORAL | 0 refills | Status: DC

## 2024-04-30 NOTE — Telephone Encounter (Signed)
Rx was already refilled

## 2024-05-01 ENCOUNTER — Other Ambulatory Visit: Payer: Self-pay | Admitting: Internal Medicine

## 2024-05-03 ENCOUNTER — Other Ambulatory Visit: Payer: Self-pay | Admitting: Cardiovascular Disease

## 2024-05-03 DIAGNOSIS — I480 Paroxysmal atrial fibrillation: Secondary | ICD-10-CM

## 2024-05-03 NOTE — Telephone Encounter (Signed)
 Prescription refill request for Xarelto  received.  Indication: Afib  Last office visit: 04/20/23 Florestine)  Weight: 75.5kg Age: 73 Scr: 0.69 (12/05/23) CrCl: 89.60ml/min  Office visit overdue. Message sent to schedulers.

## 2024-05-14 ENCOUNTER — Encounter: Payer: Self-pay | Admitting: *Deleted

## 2024-05-14 ENCOUNTER — Other Ambulatory Visit: Payer: Self-pay | Admitting: Cardiovascular Disease

## 2024-05-14 ENCOUNTER — Other Ambulatory Visit: Payer: Self-pay | Admitting: Internal Medicine

## 2024-05-15 ENCOUNTER — Other Ambulatory Visit: Payer: Self-pay | Admitting: Internal Medicine

## 2024-05-15 ENCOUNTER — Other Ambulatory Visit: Payer: Self-pay | Admitting: Cardiovascular Disease

## 2024-05-15 DIAGNOSIS — I480 Paroxysmal atrial fibrillation: Secondary | ICD-10-CM

## 2024-05-15 NOTE — Telephone Encounter (Signed)
 Attempted to schedule fu appt, no voicemail available.

## 2024-05-16 ENCOUNTER — Other Ambulatory Visit: Payer: Self-pay | Admitting: Cardiovascular Disease

## 2024-05-29 ENCOUNTER — Other Ambulatory Visit: Payer: Self-pay | Admitting: Cardiovascular Disease

## 2024-05-29 DIAGNOSIS — I480 Paroxysmal atrial fibrillation: Secondary | ICD-10-CM

## 2024-05-29 NOTE — Telephone Encounter (Signed)
 Xarelto  20mg  refill request received. Pt is 73 years old, weight-75.5kg, Crea-0.69 on 12/05/23, last seen by Dr. Gollan on 04/20/23-pt needs an appointment, Diagnosis-Afib, CrCl-89.13 mL/min; Dose is appropriate based on dosing criteria.   Patient needs an appointment with Cardiologist. Message sent to Schedulers

## 2024-05-30 NOTE — Telephone Encounter (Signed)
 Pt has scheduled appt with Dr Gollan on 07/22/24. Refill sent.

## 2024-06-02 ENCOUNTER — Other Ambulatory Visit: Payer: Self-pay | Admitting: Internal Medicine

## 2024-06-18 ENCOUNTER — Telehealth: Payer: Self-pay | Admitting: Internal Medicine

## 2024-06-18 DIAGNOSIS — Z1331 Encounter for screening for depression: Secondary | ICD-10-CM | POA: Diagnosis not present

## 2024-06-18 DIAGNOSIS — R413 Other amnesia: Secondary | ICD-10-CM | POA: Diagnosis not present

## 2024-06-18 NOTE — Telephone Encounter (Signed)
 error

## 2024-06-20 ENCOUNTER — Encounter: Payer: Self-pay | Admitting: Internal Medicine

## 2024-06-20 NOTE — Telephone Encounter (Signed)
 Please call and notify - night time is usually a good time to take aricept.  If not sleeping, can try melatonin - can start with 3mg  before bed. Let us  know if persistent problems.

## 2024-06-21 ENCOUNTER — Other Ambulatory Visit: Payer: Self-pay | Admitting: Neurology

## 2024-06-21 DIAGNOSIS — R413 Other amnesia: Secondary | ICD-10-CM

## 2024-06-27 ENCOUNTER — Ambulatory Visit: Admission: RE | Admit: 2024-06-27 | Source: Ambulatory Visit

## 2024-07-02 ENCOUNTER — Ambulatory Visit
Admission: RE | Admit: 2024-07-02 | Discharge: 2024-07-02 | Disposition: A | Discharge status: Home / Self Care | Source: Ambulatory Visit | Attending: Neurology | Admitting: Neurology

## 2024-07-02 DIAGNOSIS — R413 Other amnesia: Secondary | ICD-10-CM

## 2024-07-09 ENCOUNTER — Ambulatory Visit: Payer: Self-pay

## 2024-07-09 ENCOUNTER — Ambulatory Visit: Admitting: Internal Medicine

## 2024-07-09 NOTE — Telephone Encounter (Signed)
 FYI Only or Action Required?: FYI only for provider.  Patient was last seen in primary care on 08/14/2023 by Hope Merle, MD.  Called Nurse Triage reporting Dizziness.  Symptoms began yesterday.  Interventions attempted: Rest, hydration, or home remedies.  Symptoms are: unchanged.  Triage Disposition: See Physician Within 24 Hours  Patient/caregiver understands and will follow disposition?: Yes   Copied from CRM [id number]. Topic: Clinical - Red Word Triage >> Jul 09, 2024  1:23 PM Drema MATSU wrote: [facility] that prompted transfer to Nurse Triage: Patient has been dizzy for about 2 weeks, has diarrhea, and not eating. Patient daughter is not sure if she should bring mom to appointment. Reason for Disposition  [1] MODERATE dizziness (e.g., interferes with normal activities) AND [2] has NOT been evaluated by doctor (or NP/PA) for this  (Exception: Dizziness caused by heat exposure, sudden standing, or poor fluid intake.)  [1] MODERATE diarrhea (e.g., 4-6 times / day more than normal) AND [2] age > 40 years    Unsure how many bouts today  Answer Assessment - Initial Assessment Questions 1. DESCRIPTION: Describe your dizziness.     dizzy 2. LIGHTHEADED: Do you feel lightheaded? (e.g., somewhat faint, woozy, weak upon standing)     No 3. VERTIGO: Do you feel like either you or the room is spinning or tilting? (i.e., vertigo)     spinning 4. SEVERITY: How bad is it?  Do you feel like you are going to faint? Can you stand and walk?     no 5. ONSET:  When did the dizziness begin?     2 weeks  6. AGGRAVATING FACTORS: Does anything make it worse? (e.g., standing, change in head position)     Walking 7. HEART RATE: Can you tell me your heart rate? How many beats in 15 seconds?  (Note: Not all patients can do this.)       Not reported  8. CAUSE: What do you think is causing the dizziness? (e.g., decreased fluids or food, diarrhea, emotional distress, heat exposure,  new medicine, sudden standing, vomiting; unknown)     Unsure 9. RECURRENT SYMPTOM: Have you had dizziness before? If Yes, ask: When was the last time? What happened that time?     yes 10. OTHER SYMPTOMS: Do you have any other symptoms? (e.g., fever, chest pain, vomiting, diarrhea, bleeding)      Diarrhea  Answer Assessment - Initial Assessment Questions Additional info:  1) Patient daughter calling in to see if she should still bring patient for follow up visit scheduled tomorrow, she has developed diarrhea.  2) Patient has dementia, daughter feels it is progressing. Daughter not with patient at this time, she will be visiting later today to check on her, advised to measure temperature and blood pressure and call back if out of range.    1. DIARRHEA SEVERITY: How bad is the diarrhea? How many more stools have you had in the past 24 hours than normal?      Unsure 2. ONSET: When did the diarrhea begin?      Yesterday  3. STOOL DESCRIPTION:  How loose or watery is the diarrhea? What is the stool color? Is there any blood or mucous in the stool?     Loose stool  4. VOMITING: Are you also vomiting? If Yes, ask: How many times in the past 24 hours?      no 5. ABDOMEN PAIN: Are you having any abdomen pain? If Yes, ask: What does it feel like? (e.g.,  crampy, dull, intermittent, constant)      no 6. ABDOMEN PAIN SEVERITY: If present, ask: How bad is the pain?  (e.g., Scale 1-10; mild, moderate, or severe)     0/10 7. ORAL INTAKE: If vomiting, Have you been able to drink liquids? How much liquids have you had in the past 24 hours?     Drinking fluids 8. HYDRATION: Any signs of dehydration? (e.g., dry mouth [not just dry lips], too weak to stand, dizziness, new weight loss) When did you last urinate?     Does not appear dehydrated 9. EXPOSURE: Have you traveled to a foreign country recently? Have you been exposed to anyone with diarrhea? Could you have  eaten any food that was spoiled?      10. ANTIBIOTIC USE: Are you taking antibiotics now or have you taken antibiotics in the past 2 months?        11. OTHER SYMPTOMS: Do you have any other symptoms? (e.g., fever, blood in stool)       Intermittent dizziness X 2 weeks. Poor appetite.  Protocols used: Dizziness - Lightheadedness-A-AH, Diarrhea-A-AH

## 2024-07-09 NOTE — Telephone Encounter (Signed)
 FYI- Called and spoke with daughter. She is going to take patient to urgent care this PM to be seen for the dizziness and diarrhea. She reports the diarrhea has been worse in the last 2 days. Intermittent dizziness for 2 weeks. She is going to keep f/u with you tomorrow because she has not been seen in over a year.

## 2024-07-09 NOTE — Telephone Encounter (Unsigned)
 Copied from CRM [id number]. Topic: General - Call Back - No Documentation >> Jul 09, 2024  3:24 PM Harlene ORN wrote: Reason for CRM: Daughter of patient called back. Nurse Lolita left a vm to return her call

## 2024-07-09 NOTE — Telephone Encounter (Signed)
 LM for patients daughter. If anything significantly acute, she needs to be evaluated today. Otherwise, she needs to keep her appointment for tomorrow.

## 2024-07-10 ENCOUNTER — Ambulatory Visit (INDEPENDENT_AMBULATORY_CARE_PROVIDER_SITE_OTHER): Admitting: Internal Medicine

## 2024-07-10 ENCOUNTER — Encounter: Payer: Self-pay | Admitting: Internal Medicine

## 2024-07-10 DIAGNOSIS — Z1231 Encounter for screening mammogram for malignant neoplasm of breast: Secondary | ICD-10-CM

## 2024-07-10 DIAGNOSIS — I10 Essential (primary) hypertension: Secondary | ICD-10-CM

## 2024-07-10 DIAGNOSIS — R739 Hyperglycemia, unspecified: Secondary | ICD-10-CM

## 2024-07-10 DIAGNOSIS — E7800 Pure hypercholesterolemia, unspecified: Secondary | ICD-10-CM

## 2024-07-10 DIAGNOSIS — R413 Other amnesia: Secondary | ICD-10-CM

## 2024-07-10 NOTE — Telephone Encounter (Signed)
 I am not sure if she was seen or where she was seen. Please obtain records prior to her appt today if was seen.

## 2024-07-10 NOTE — Progress Notes (Deleted)
 Subjective:    Patient ID: Allison [name], female    DOB: 03-16-1951, 73 y.o.   MRN: [id number]  Patient here for No chief complaint on file.   HPI Here for a scheduled follow up - follow up regarding hypertension and memory issues. Saw neurology 06/18/24 - evaluation - mild dementia. Recommended MRI brain. Start aricept.    Past Medical History:  Diagnosis Date   A-fib [facility]    GERD (gastroesophageal reflux disease)    Hypercholesterolemia    Hypertension    Migraine headache    Psoriasis    Past Surgical History:  Procedure Laterality Date   COLONOSCOPY WITH PROPOFOL  N/A 12/23/2016   Procedure: COLONOSCOPY WITH PROPOFOL ;  Surgeon: Gladis RAYMOND Mariner, MD;  Location: [facility] ENDOSCOPY;  Service: Endoscopy;  Laterality: N/A;   DILATION AND CURETTAGE OF UTERUS     history of abnormal bleeding   TUBAL LIGATION     Family History  Problem Relation Age of Onset   Asthma Father    Diabetes Mother    CVA Mother    Psoriasis Mother    Breast cancer Maternal Grandmother    Breast cancer Paternal Grandmother    Colon cancer Maternal Uncle    Colon cancer Other        nephew   Diabetes Other        multiple relatives (both sides)   Stroke Brother        Light stroke   Diabetes Brother    Social History   Socioeconomic History   Marital status: Single    Spouse name: Not on file   Number of children: 1   Years of education: Not on file   Highest education level: Not on file  Occupational History   Not on file  Tobacco Use   Smoking status: Never   Smokeless tobacco: Never  Vaping Use   Vaping status: Never Used  Substance and Sexual Activity   Alcohol use: No    Alcohol/week: 0.0 standard drinks of alcohol   Drug use: No   Sexual activity: Not on file  Other Topics Concern   Not on file  Social History Narrative   Not on file   Social Drivers of Health   Financial Resource Strain: Low Risk  (02/20/2023)   Overall Financial Resource Strain (CARDIA)     Difficulty of Paying Living Expenses: Not hard at all  Food Insecurity: No Food Insecurity (02/20/2023)   Hunger Vital Sign    Worried About Running Out of Food in the Last Year: Never true    Ran Out of Food in the Last Year: Never true  Transportation Needs: No Transportation Needs (02/20/2023)   PRAPARE - Administrator, Civil Service (Medical): No    Lack of Transportation (Non-Medical): No  Physical Activity: Sufficiently Active (02/20/2023)   Exercise Vital Sign    Days of Exercise per Week: 5 days    Minutes of Exercise per Session: 30 min  Stress: No Stress Concern Present (02/20/2023)   [employer] of Occupational Health - Occupational Stress Questionnaire    Feeling of Stress : Not at all  Social Connections: Unknown (02/20/2023)   Social Connection and Isolation Panel    Frequency of Communication with Friends and Family: More than three times a week    Frequency of Social Gatherings with Friends and Family: More than three times a week    Attends Religious Services: Not on Insurance claims handler of [employer]  or Organizations: Not on file    Attends Club or Organization Meetings: Not on file    Marital Status: Not on file     Review of Systems     Objective:     There were no vitals taken for this visit. Wt Readings from Last 3 Encounters:  08/14/23 166 lb 6 oz (75.5 kg)  08/02/23 167 lb 12.8 oz (76.1 kg)  05/01/23 168 lb 12.8 oz (76.6 kg)    Physical Exam  {Perform Simple Foot Exam  Perform Detailed exam:1} {Insert foot Exam (Optional):30965}   Outpatient Encounter Medications as of 07/10/2024  Medication Sig   alendronate  (FOSAMAX ) 70 MG tablet TAKE 1 TABLET BY MOUTH ONCE A WEEK ON AN EMPTY STOMACH WITH A FULL GLASS OF WATER   ALPRAZolam  (XANAX ) 0.25 MG tablet TAKE 1 TABLET BY MOUTH ONCE DAILY AS NEEDED FOR ANXIETY   Calcium  Carb-Cholecalciferol  (CALCIUM  500/VITAMIN D PO) Take 2,000 Units by mouth daily.   diltiazem  (CARDIZEM  CD) 120 MG 24 hr  capsule Take 1 capsule by mouth once daily   diltiazem  (CARDIZEM ) 30 MG tablet TAKE 1 TABLET BY MOUTH THREE TIMES DAILY AS NEEDED FOR  TACHYCARDIA/  ATRIAL  FIB   fluticasone  (FLONASE ) 50 MCG/ACT nasal spray Place 2 sprays into both nostrils daily.   FLUZONE HIGH-DOSE 0.5 ML injection Inject 0.5 mLs into the muscle once.   metoprolol  succinate (TOPROL -XL) 50 MG 24 hr tablet TAKE 1 & 1/2 (ONE & ONE-HALF) TABLETS BY MOUTH ONCE DAILY WITH OR IMMEDIATELY FOLLOWING MEAL   omeprazole  (PRILOSEC) 20 MG capsule Take 1 capsule by mouth once daily   potassium chloride  (KLOR-CON ) 10 MEQ tablet Take 1 tablet by mouth once daily   rivaroxaban  (XARELTO ) 20 MG TABS tablet TAKE 1 TABLET BY MOUTH ONCE DAILY WITH SUPPER   rosuvastatin  (CRESTOR ) 10 MG tablet Take 1 tablet by mouth once daily   rosuvastatin  (CRESTOR ) 20 MG tablet Take 1 tablet (20 mg total) by mouth daily.   No facility-administered encounter medications on file as of 07/10/2024.     Lab Results  Component Value Date   WBC 5.7 05/01/2023   HGB 14.3 05/01/2023   HCT 43.9 05/01/2023   PLT 250.0 05/01/2023   GLUCOSE 109 (H) 12/05/2023   CHOL 159 12/05/2023   TRIG 99.0 12/05/2023   HDL 61.90 12/05/2023   LDLCALC 77 12/05/2023   ALT 15 12/05/2023   AST 21 12/05/2023   NA 142 12/05/2023   K 4.3 12/05/2023   CL 103 12/05/2023   CREATININE 0.69 12/05/2023   BUN 11 12/05/2023   CO2 31 12/05/2023   TSH 1.38 05/01/2023   INR 2.3 (H) 12/13/2020   HGBA1C 6.0 12/05/2023    No results found.     Assessment & Plan:  There are no diagnoses linked to this encounter.   Allena Hamilton, MD

## 2024-07-10 NOTE — Progress Notes (Signed)
 Patient ID: Allison [name], female   DOB: 29-Dec-1950, 73 y.o.   MRN: [id number] Pt canceled appt.

## 2024-07-10 NOTE — Telephone Encounter (Signed)
 LM for Brandi. Please relay message from Dr Glendia below.

## 2024-07-11 ENCOUNTER — Inpatient Hospital Stay
Admission: EM | Admit: 2024-07-11 | Discharge: 2024-07-13 | DRG: 392 | Disposition: A | Discharge status: Home / Self Care | Attending: Internal Medicine | Admitting: Internal Medicine

## 2024-07-11 ENCOUNTER — Other Ambulatory Visit: Payer: Self-pay

## 2024-07-11 DIAGNOSIS — Z825 Family history of asthma and other chronic lower respiratory diseases: Secondary | ICD-10-CM | POA: Diagnosis not present

## 2024-07-11 DIAGNOSIS — Z803 Family history of malignant neoplasm of breast: Secondary | ICD-10-CM

## 2024-07-11 DIAGNOSIS — K529 Noninfective gastroenteritis and colitis, unspecified: Secondary | ICD-10-CM | POA: Diagnosis present

## 2024-07-11 DIAGNOSIS — E860 Dehydration: Secondary | ICD-10-CM | POA: Diagnosis present

## 2024-07-11 DIAGNOSIS — I10 Essential (primary) hypertension: Secondary | ICD-10-CM | POA: Diagnosis present

## 2024-07-11 DIAGNOSIS — Z79899 Other long term (current) drug therapy: Secondary | ICD-10-CM

## 2024-07-11 DIAGNOSIS — E872 Acidosis, unspecified: Secondary | ICD-10-CM | POA: Diagnosis present

## 2024-07-11 DIAGNOSIS — K219 Gastro-esophageal reflux disease without esophagitis: Secondary | ICD-10-CM | POA: Diagnosis present

## 2024-07-11 DIAGNOSIS — Z833 Family history of diabetes mellitus: Secondary | ICD-10-CM

## 2024-07-11 DIAGNOSIS — R531 Weakness: Secondary | ICD-10-CM | POA: Diagnosis not present

## 2024-07-11 DIAGNOSIS — Z7901 Long term (current) use of anticoagulants: Secondary | ICD-10-CM

## 2024-07-11 DIAGNOSIS — R197 Diarrhea, unspecified: Secondary | ICD-10-CM | POA: Diagnosis present

## 2024-07-11 DIAGNOSIS — E876 Hypokalemia: Secondary | ICD-10-CM | POA: Diagnosis present

## 2024-07-11 DIAGNOSIS — F03A0 Unspecified dementia, mild, without behavioral disturbance, psychotic disturbance, mood disturbance, and anxiety: Secondary | ICD-10-CM | POA: Diagnosis present

## 2024-07-11 DIAGNOSIS — E7800 Pure hypercholesterolemia, unspecified: Secondary | ICD-10-CM | POA: Diagnosis present

## 2024-07-11 DIAGNOSIS — I482 Chronic atrial fibrillation, unspecified: Secondary | ICD-10-CM | POA: Diagnosis present

## 2024-07-11 DIAGNOSIS — Z800 Family history of malignant neoplasm of digestive organs: Secondary | ICD-10-CM | POA: Diagnosis not present

## 2024-07-11 DIAGNOSIS — I959 Hypotension, unspecified: Secondary | ICD-10-CM

## 2024-07-11 DIAGNOSIS — Z91041 Radiographic dye allergy status: Secondary | ICD-10-CM

## 2024-07-11 DIAGNOSIS — Z823 Family history of stroke: Secondary | ICD-10-CM

## 2024-07-11 DIAGNOSIS — R9431 Abnormal electrocardiogram [ECG] [EKG]: Principal | ICD-10-CM

## 2024-07-11 LAB — COMPREHENSIVE METABOLIC PANEL WITH GFR
ALT: 31 U/L (ref 0–44)
AST: 61 U/L — ABNORMAL HIGH (ref 15–41)
Albumin: 3.6 g/dL (ref 3.5–5.0)
Alkaline Phosphatase: 58 U/L (ref 38–126)
Anion gap: 20 — ABNORMAL HIGH (ref 5–15)
BUN: 17 mg/dL (ref 8–23)
CO2: 38 mmol/L — ABNORMAL HIGH (ref 22–32)
Calcium: 9.2 mg/dL (ref 8.9–10.3)
Chloride: 78 mmol/L — ABNORMAL LOW (ref 98–111)
Creatinine, Ser: 0.91 mg/dL (ref 0.44–1.00)
GFR, Estimated: >60 mL/min (ref >60)
Glucose, Bld: 121 mg/dL — ABNORMAL HIGH (ref 70–99)
Potassium: <2 mmol/L — CL (ref 3.5–5.1)
Sodium: 136 mmol/L (ref 135–145)
Total Bilirubin: 2 mg/dL — ABNORMAL HIGH (ref 0.0–1.2)
Total Protein: 6.8 g/dL (ref 6.5–8.1)

## 2024-07-11 LAB — CBC
HCT: 47.5 % — ABNORMAL HIGH (ref 36.0–46.0)
Hemoglobin: 16.4 g/dL — ABNORMAL HIGH (ref 12.0–15.0)
MCH: 31 pg (ref 26.0–34.0)
MCHC: 34.5 g/dL (ref 30.0–36.0)
MCV: 89.8 fL (ref 80.0–100.0)
Platelets: 251 K/uL (ref 150–400)
RBC: 5.29 MIL/uL — ABNORMAL HIGH (ref 3.87–5.11)
RDW: 13.6 % (ref 11.5–15.5)
WBC: 5.5 K/uL (ref 4.0–10.5)
nRBC: 0 % (ref 0.0–0.2)

## 2024-07-11 LAB — LIPASE, BLOOD: Lipase: 35 U/L (ref 11–51)

## 2024-07-11 LAB — MAGNESIUM: Magnesium: 2.1 mg/dL (ref 1.7–2.4)

## 2024-07-11 MED ORDER — MAGNESIUM SULFATE 2 GM/50ML IV SOLN
2.0000 g | Freq: Once | INTRAVENOUS | Status: AC
  Administered 2024-07-11: 2 g via INTRAVENOUS
  Filled 2024-07-11: qty 50

## 2024-07-11 MED ORDER — POTASSIUM CHLORIDE CRYS ER 20 MEQ PO TBCR
40.0000 meq | EXTENDED_RELEASE_TABLET | Freq: Once | ORAL | Status: AC
  Administered 2024-07-11: 40 meq via ORAL
  Filled 2024-07-11: qty 2

## 2024-07-11 MED ORDER — SODIUM CHLORIDE 0.9 % IV BOLUS
1000.0000 mL | Freq: Once | INTRAVENOUS | Status: AC
  Administered 2024-07-11: 1000 mL via INTRAVENOUS

## 2024-07-11 MED ORDER — POTASSIUM CHLORIDE 10 MEQ/100ML IV SOLN
10.0000 meq | INTRAVENOUS | Status: AC
Start: 2024-07-11 — End: 2024-07-11
  Administered 2024-07-11 (×5): 10 meq via INTRAVENOUS
  Filled 2024-07-11 (×3): qty 100

## 2024-07-11 NOTE — Telephone Encounter (Signed)
 FYI   Called and spoke with Brandi. Patients diarrhea stopped night before last but she has only urinated 2-3 times in the last 2 days. Appt was cancelled because she could not convince her mom to get up to come to appt. Advised that given patient was having persistent diarrhea and now not urinating, she needs to go to ED. Daughter agreed. Advised if pt did not agree to go by private vehicle, she needs to call EMS. Appt cancelled for tomorrow.

## 2024-07-11 NOTE — ED Provider Notes (Signed)
 [facility] Provider Note    Event Date/Time   First MD Initiated Contact with Patient 07/11/24 [phone]     (approximate)   History   Diarrhea   HPI  Allison [name] is a 73 y.o. female who comes in with diarrhea for the past 2 or 3 with decreased urine output.  No known sick contacts.  Reviewed the blood work and patient is on Xarelto .  According to patient she reports that she has had multiple episodes of watery stools.  She reports that she actually has not had any in the past 24 hours but she just now is feeling very weak and having cramping.  She denies any abdominal pain, falls or hitting her head or any other concerns.  Daughter is at bedside and does report that patient does have a history of A-fib and is on Xarelto , metoprolol . Physical Exam   Triage Vital Signs: ED Triage Vitals  Encounter Vitals Group     BP 07/11/24 1457 (!) 98/54     Girls Systolic BP Percentile --      Girls Diastolic BP Percentile --      Boys Systolic BP Percentile --      Boys Diastolic BP Percentile --      Pulse Rate 07/11/24 1457 (!) 105     Resp 07/11/24 1457 18     Temp 07/11/24 1457 98.3 F (36.8 C)     Temp Source 07/11/24 1457 Oral     SpO2 07/11/24 1457 96 %     Weight 07/11/24 1459 150 lb (68 kg)     Height 07/11/24 1459 5' 8 (1.727 m)     Head Circumference --      Peak Flow --      Pain Score 07/11/24 1459 0     Pain Loc --      Pain Education --      Exclude from Growth Chart --     Most recent vital signs: Vitals:   07/11/24 1457  BP: (!) 98/54  Pulse: (!) 105  Resp: 18  Temp: 98.3 F (36.8 C)  SpO2: 96%     General: Awake, no distress.  CV:  Good peripheral perfusion.  Resp:  Normal effort.  Abd:  No distention.  Soft nontender Other:  No swelling in legs   ED Results / Procedures / Treatments   Labs (all labs ordered are listed, but only abnormal results are displayed) Labs Reviewed  COMPREHENSIVE METABOLIC PANEL WITH  GFR - Abnormal; Notable for the following components:      Result Value   Potassium <2.0 (*)    Chloride 78 (*)    CO2 38 (*)    Glucose, Bld 121 (*)    AST 61 (*)    Total Bilirubin 2.0 (*)    Anion gap 20 (*)    All other components within normal limits  CBC - Abnormal; Notable for the following components:   RBC 5.29 (*)    Hemoglobin 16.4 (*)    HCT 47.5 (*)    All other components within normal limits  GASTROINTESTINAL PANEL BY PCR, STOOL (REPLACES STOOL CULTURE)  C DIFFICILE QUICK SCREEN W PCR REFLEX    LIPASE, BLOOD  URINALYSIS, ROUTINE W REFLEX MICROSCOPIC  MAGNESIUM      EKG  My interpretation of EKG:  Normal sinus rate of 81 without any ST elevation or T wave inversion, QTc prolonged at 541    PROCEDURES:  Critical Care performed: Yes,  see critical care procedure note(s)  .1-3 Lead EKG Interpretation  Performed by: Ernest Ronal BRAVO, MD Authorized by: Ernest Ronal BRAVO, MD     Interpretation: normal     ECG rate:  90   ECG rate assessment: normal     Rhythm: sinus rhythm     Ectopy: none     Conduction: normal   .Critical Care  Performed by: Ernest Ronal BRAVO, MD Authorized by: Ernest Ronal BRAVO, MD   Critical care provider statement:    Critical care time (minutes):  30   Critical care was necessary to treat or prevent imminent or life-threatening deterioration of the following conditions: electrolyte abnormalities.   Critical care was time spent personally by me on the following activities:  Development of treatment plan with patient or surrogate, discussions with consultants, evaluation of patient's response to treatment, examination of patient, ordering and review of laboratory studies, ordering and review of radiographic studies, ordering and performing treatments and interventions, pulse oximetry, re-evaluation of patient's condition and review of old charts    MEDICATIONS ORDERED IN ED: Medications  potassium chloride  10 mEq in 100 mL IVPB (has no  administration in time range)  potassium chloride  SA (KLOR-CON  M) CR tablet 40 mEq (has no administration in time range)  magnesium  sulfate IVPB 2 g 50 mL (has no administration in time range)  sodium chloride  0.9 % bolus 1,000 mL (has no administration in time range)     IMPRESSION / MDM / ASSESSMENT AND PLAN / ED COURSE  I reviewed the triage vital signs and the nursing notes.   Patient's presentation is most consistent with acute presentation with potential threat to life or bodily function.   Patient comes in with weakness, cramping the setting of diarrhea.  Differential includes dehydration, electrolyte abnormalities.  Abdomen soft and nontender low suspicion for perforation.  Stool studies ordered to evaluate for pathogens given significant amount of stooling leading to electrolyte abnormalities.  EKG does show prolonged QTc and patient is noted to be severely hypok.  Patient ordered 40 of IV and 40 of p.o. potassium.  Patient will be placed on the cardiac monitored.  Patient will need post repletion of her potassium in the setting of abnormal EKG with prolonged QTc.  Given patient is symptomatic I will discuss with the hospital team for admission.  I have also ordered 1 L of fluid given her dehydration and slight hypotension.  Her repeat heart rate was in the 80s and normal sinus and no evidence of A-fib at this time.    Discussed with the hospital team for admission   The patient is on the cardiac monitor to evaluate for evidence of arrhythmia and/or significant heart rate changes.      FINAL CLINICAL IMPRESSION(S) / ED DIAGNOSES   Final diagnoses:  QT prolongation  Hypokalemia  Weakness  Hypotension, unspecified hypotension type     Rx / DC Orders   ED Discharge Orders     None        Note:  This document was prepared using Dragon voice recognition software and may include unintentional dictation errors.   Ernest Ronal BRAVO, MD 07/11/24 [phone]

## 2024-07-11 NOTE — ED Triage Notes (Signed)
 Daughter states patient had diarrhea for 2-3 days and little urine output; patient denies pain. No known sick contacts.

## 2024-07-11 NOTE — H&P (Signed)
 History and Physical    Patient: Allison [name] FMW:[id number] DOB: 27-Apr-1951 DOA: 07/11/2024 DOS: the patient was seen and examined on 07/11/2024 PCP: Glendia Shad, MD  Patient coming from: Home  Chief Complaint:  Chief Complaint  Patient presents with   Diarrhea   HPI: Allison [name] is a 73 y.o. female with medical history significant of  Afibrilllation, HTN, GERD, HL, Dementia comes to the emergency room with complaints of dizziness, weakness and diarrhea on and off for last couple days. Last area was on Wednesday around 2:30 PM per daughter. Daughter is at bedside gives most history. Patient denies any recent travel home. Daughter says that time she eats stale food that could have triggered also. Patient denies any abdominal pain nausea vomiting.  In the ER she is hemodynamically stable at present after receiving IV fluid bolus. Blood pressure was soft. Mild tachycardia. She was found her potassium less than 2.0. Appears to be dehydrated clinically with him concentration of H&H and elevated anion gap due to poor PO intake.  Patient is being admitted for severe hypokalemia in the setting of diarrhea with anion gap acidosis/clinical dehydration   Review of Systems: As mentioned in the history of present illness. All other systems reviewed and are negative. Past Medical History:  Diagnosis Date   A-fib [facility]    GERD (gastroesophageal reflux disease)    Hypercholesterolemia    Hypertension    Migraine headache    Psoriasis    Past Surgical History:  Procedure Laterality Date   COLONOSCOPY WITH PROPOFOL  N/A 12/23/2016   Procedure: COLONOSCOPY WITH PROPOFOL ;  Surgeon: Gladis RAYMOND Mariner, MD;  Location: [facility] ENDOSCOPY;  Service: Endoscopy;  Laterality: N/A;   DILATION AND CURETTAGE OF UTERUS     history of abnormal bleeding   TUBAL LIGATION     Social History:  reports that she has never smoked. She has never used smokeless tobacco. She reports that she does not drink  alcohol and does not use drugs.  Allergies  Allergen Reactions   Iodine     Family History  Problem Relation Age of Onset   Asthma Father    Diabetes Mother    CVA Mother    Psoriasis Mother    Breast cancer Maternal Grandmother    Breast cancer Paternal Grandmother    Colon cancer Maternal Uncle    Colon cancer Other        nephew   Diabetes Other        multiple relatives (both sides)   Stroke Brother        Light stroke   Diabetes Brother     Prior to Admission medications   Medication Sig Start Date End Date Taking? Authorizing Provider  ALPRAZolam  (XANAX ) 0.25 MG tablet TAKE 1 TABLET BY MOUTH ONCE DAILY AS NEEDED FOR ANXIETY 01/26/23  Yes Glendia Shad, MD  Calcium  Carb-Cholecalciferol  (CALCIUM  500/VITAMIN D PO) Take 2,000 Units by mouth daily.   Yes [provider]  diltiazem  (CARDIZEM  CD) 120 MG 24 hr capsule Take 1 capsule by mouth once daily 03/06/24  Yes Gollan, Timothy J, MD  donepezil (ARICEPT) 5 MG tablet Take 5 mg by mouth at bedtime. 06/18/24 07/18/24 Yes [provider]  fluticasone  (FLONASE ) 50 MCG/ACT nasal spray Place 2 sprays into both nostrils daily. 08/14/23  Yes Hope Merle, MD  metoprolol  succinate (TOPROL -XL) 50 MG 24 hr tablet TAKE 1 & 1/2 (ONE & ONE-HALF) TABLETS BY MOUTH ONCE DAILY WITH OR IMMEDIATELY FOLLOWING MEAL 05/17/24  Yes Gollan,  Evalene PARAS, MD  omeprazole  (PRILOSEC) 20 MG capsule Take 1 capsule by mouth once daily 06/03/24  Yes Glendia Shad, MD  potassium chloride  (KLOR-CON ) 10 MEQ tablet Take 1 tablet by mouth once daily 03/06/24  Yes Gollan, Timothy J, MD  rivaroxaban  (XARELTO ) 20 MG TABS tablet TAKE 1 TABLET BY MOUTH ONCE DAILY WITH SUPPER 05/30/24  Yes Gollan, Timothy J, MD  rosuvastatin  (CRESTOR ) 10 MG tablet Take 1 tablet by mouth once daily 04/16/24  Yes Glendia Shad, MD  ascorbic acid  (VITAMIN C ) 500 MG tablet Take 500 mg by mouth daily. Patient not taking: Reported on 07/11/2024    [provider]     Physical Exam: Vitals:   07/11/24 1457 07/11/24 1459  BP: (!) 98/54   Pulse: (!) 105   Resp: 18   Temp: 98.3 F (36.8 C)   TempSrc: Oral   SpO2: 96%   Weight:  68 kg  Height:  5' 8 (1.727 m)  Physical Exam Constitutional:      Appearance: Normal appearance.  HENT:     Head: Normocephalic and atraumatic.  Eyes:     Pupils: Pupils are equal, round, and reactive to light.  Cardiovascular:     Rate and Rhythm: Normal rate.  Pulmonary:     Effort: Pulmonary effort is normal.     Breath sounds: Normal breath sounds.  Abdominal:     General: Abdomen is flat. Bowel sounds are normal.     Palpations: Abdomen is soft.  Skin:    General: Skin is warm and dry.  Neurological:     General: No focal deficit present.     Mental Status: She is alert and oriented to person, place, and time.    Allison [name] is a 73 y.o. female with medical history significant of  Afibrilllation, HTN, GERD, HL, Dementia comes to the emergency room with complaints of dizziness, weakness and diarrhea on and off for last couple days. Last area was on Wednesday around 2:30 PM per daughter. Daughter is at bedside gives most history. Patient denies any recent travel home. Daughter says that time she eats stale food that could have triggered also. Patient denies any abdominal pain nausea vomiting.  Acute gastroenteritis severe hypokalemia anion gap metabolic acidosis/starvation ketosis -- patient came in with increasing diarrhea for last couple days. None since yesterday. -- Poor PO intake for last several days per family -- found to have potassium of less than 2.0. No acute EKG changes -- received IV fluid bolus and IV runs of potassium along with oral -- pharmacy to follow electrolytes --continue IV fluids -- stool for C. diff and G.I. PCR  Chronic a fib on anticoagulation -- continue diltiazem  and Xarelto  -- if blood pressure remains stable will resume metoprolol  tomorrow  Hyperlipidemia --  continue statins  Mild dementia -- patient will continue home meds -- follows with Dr. Jannett show as outpatient --   Advance Care Planning:   Code Status: Full Code fper daughter  Consults: none  Family Communication: daughter at bedside  Severity of Illness: The appropriate patient status for this patient is OBSERVATION. Observation status is judged to be reasonable and necessary in order to provide the required intensity of service to ensure the patient's safety. The patient's presenting symptoms, physical exam findings, and initial radiographic and laboratory data in the context of their medical condition is felt to place them at decreased risk for further clinical deterioration. Furthermore, it is anticipated that the patient will be medically stable for  discharge from the hospital within 2 midnights of admission.   Author: Leita Blanch, MD 07/11/2024 5:06 PM  For on call review [email or web address].

## 2024-07-11 NOTE — Consult Note (Signed)
 PHARMACY CONSULT NOTE - FOLLOW UP  Pharmacy Consult for Electrolyte Monitoring and Replacement   Recent Labs: Potassium (mmol/L)  Date Value  07/11/2024 <2.0 (LL)   Magnesium  (mg/dL)  Date Value  90/81/7974 2.1   Calcium  (mg/dL)  Date Value  90/81/7974 9.2   Albumin (g/dL)  Date Value  90/81/7974 3.6   Sodium (mmol/L)  Date Value  07/11/2024 136     Assessment: 73 y.o. female who comes in with diarrhea for the past 2 or 3 with decreased urine output.   Goal of Therapy:  WNL  Plan:  Medical team ordered KCL 10 mEq x 4 and KCL 40 mEq x 1.   Cathaleen GORMAN Blanch ,PharmD Clinical Pharmacist 07/11/2024 4:54 PM

## 2024-07-12 ENCOUNTER — Encounter: Payer: Self-pay | Admitting: Internal Medicine

## 2024-07-12 ENCOUNTER — Other Ambulatory Visit: Payer: Self-pay | Admitting: Internal Medicine

## 2024-07-12 ENCOUNTER — Ambulatory Visit: Admitting: Family

## 2024-07-12 DIAGNOSIS — Z833 Family history of diabetes mellitus: Secondary | ICD-10-CM | POA: Diagnosis not present

## 2024-07-12 DIAGNOSIS — Z800 Family history of malignant neoplasm of digestive organs: Secondary | ICD-10-CM | POA: Diagnosis not present

## 2024-07-12 DIAGNOSIS — E860 Dehydration: Secondary | ICD-10-CM | POA: Diagnosis present

## 2024-07-12 DIAGNOSIS — Z7901 Long term (current) use of anticoagulants: Secondary | ICD-10-CM | POA: Diagnosis not present

## 2024-07-12 DIAGNOSIS — K529 Noninfective gastroenteritis and colitis, unspecified: Secondary | ICD-10-CM | POA: Diagnosis present

## 2024-07-12 DIAGNOSIS — E876 Hypokalemia: Secondary | ICD-10-CM | POA: Diagnosis present

## 2024-07-12 DIAGNOSIS — R197 Diarrhea, unspecified: Secondary | ICD-10-CM | POA: Diagnosis present

## 2024-07-12 DIAGNOSIS — Z823 Family history of stroke: Secondary | ICD-10-CM | POA: Diagnosis not present

## 2024-07-12 DIAGNOSIS — Z803 Family history of malignant neoplasm of breast: Secondary | ICD-10-CM | POA: Diagnosis not present

## 2024-07-12 DIAGNOSIS — Z825 Family history of asthma and other chronic lower respiratory diseases: Secondary | ICD-10-CM | POA: Diagnosis not present

## 2024-07-12 DIAGNOSIS — I482 Chronic atrial fibrillation, unspecified: Secondary | ICD-10-CM | POA: Diagnosis present

## 2024-07-12 DIAGNOSIS — E7800 Pure hypercholesterolemia, unspecified: Secondary | ICD-10-CM | POA: Diagnosis present

## 2024-07-12 DIAGNOSIS — F03A0 Unspecified dementia, mild, without behavioral disturbance, psychotic disturbance, mood disturbance, and anxiety: Secondary | ICD-10-CM | POA: Diagnosis present

## 2024-07-12 DIAGNOSIS — K219 Gastro-esophageal reflux disease without esophagitis: Secondary | ICD-10-CM | POA: Diagnosis present

## 2024-07-12 DIAGNOSIS — E872 Acidosis, unspecified: Secondary | ICD-10-CM | POA: Diagnosis present

## 2024-07-12 DIAGNOSIS — R531 Weakness: Secondary | ICD-10-CM | POA: Diagnosis not present

## 2024-07-12 DIAGNOSIS — Z79899 Other long term (current) drug therapy: Secondary | ICD-10-CM | POA: Diagnosis not present

## 2024-07-12 DIAGNOSIS — Z91041 Radiographic dye allergy status: Secondary | ICD-10-CM | POA: Diagnosis not present

## 2024-07-12 DIAGNOSIS — I10 Essential (primary) hypertension: Secondary | ICD-10-CM | POA: Diagnosis present

## 2024-07-12 LAB — GASTROINTESTINAL PANEL BY PCR, STOOL (REPLACES STOOL CULTURE)

## 2024-07-12 LAB — C DIFFICILE QUICK SCREEN W PCR REFLEX
C Diff antigen: NEGATIVE
C Diff interpretation: NOT DETECTED
C Diff toxin: NEGATIVE

## 2024-07-12 LAB — URINALYSIS, ROUTINE W REFLEX MICROSCOPIC
Bilirubin Urine: NEGATIVE
Glucose, UA: NEGATIVE mg/dL
Hgb urine dipstick: NEGATIVE
Ketones, ur: 5 mg/dL — AB
Leukocytes,Ua: NEGATIVE
Nitrite: NEGATIVE
Protein, ur: NEGATIVE mg/dL
Specific Gravity, Urine: 1.01 (ref 1.005–1.030)
pH: 6 (ref 5.0–8.0)

## 2024-07-12 LAB — CBC
HCT: 38.4 % (ref 36.0–46.0)
Hemoglobin: 13.1 g/dL (ref 12.0–15.0)
MCH: 30.8 pg (ref 26.0–34.0)
MCHC: 34.1 g/dL (ref 30.0–36.0)
MCV: 90.1 fL (ref 80.0–100.0)
Platelets: 202 K/uL (ref 150–400)
RBC: 4.26 MIL/uL (ref 3.87–5.11)
RDW: 13.6 % (ref 11.5–15.5)
WBC: 4.5 K/uL (ref 4.0–10.5)
nRBC: 0 % (ref 0.0–0.2)

## 2024-07-12 LAB — RENAL FUNCTION PANEL
Albumin: 2.6 g/dL — ABNORMAL LOW (ref 3.5–5.0)
Anion gap: 12 (ref 5–15)
BUN: 14 mg/dL (ref 8–23)
CO2: 35 mmol/L — ABNORMAL HIGH (ref 22–32)
Calcium: 7.9 mg/dL — ABNORMAL LOW (ref 8.9–10.3)
Chloride: 90 mmol/L — ABNORMAL LOW (ref 98–111)
Creatinine, Ser: 0.58 mg/dL (ref 0.44–1.00)
GFR, Estimated: >60 mL/min (ref >60)
Glucose, Bld: 86 mg/dL (ref 70–99)
Phosphorus: 3.1 mg/dL (ref 2.5–4.6)
Potassium: 2.1 mmol/L — CL (ref 3.5–5.1)
Sodium: 137 mmol/L (ref 135–145)

## 2024-07-12 LAB — MAGNESIUM: Magnesium: 2.3 mg/dL (ref 1.7–2.4)

## 2024-07-12 LAB — POTASSIUM: Potassium: 3.1 mmol/L — ABNORMAL LOW (ref 3.5–5.1)

## 2024-07-12 MED ORDER — ACETAMINOPHEN 325 MG PO TABS: 650.0000 mg | ORAL_TABLET | Freq: Four times a day (QID) | ORAL | Status: DC | PRN

## 2024-07-12 MED ORDER — POTASSIUM CHLORIDE 10 MEQ/100ML IV SOLN
10.0000 meq | INTRAVENOUS | Status: AC
  Administered 2024-07-12 (×2): 10 meq via INTRAVENOUS
  Filled 2024-07-12 (×2): qty 100

## 2024-07-12 MED ORDER — POTASSIUM CHLORIDE CRYS ER 20 MEQ PO TBCR
20.0000 meq | EXTENDED_RELEASE_TABLET | Freq: Once | ORAL | Status: AC
  Administered 2024-07-12: 20 meq via ORAL
  Filled 2024-07-12: qty 1

## 2024-07-12 MED ORDER — ORAL CARE MOUTH RINSE: 15.0000 mL | OROMUCOSAL | Status: DC | PRN

## 2024-07-12 MED ORDER — VITAMIN C 500 MG PO TABS
500.0000 mg | ORAL_TABLET | Freq: Every day | ORAL | Status: DC
  Administered 2024-07-12 – 2024-07-13 (×2): 500 mg via ORAL
  Filled 2024-07-12 (×2): qty 1

## 2024-07-12 MED ORDER — POTASSIUM CHLORIDE IN NACL 20-0.9 MEQ/L-% IV SOLN
INTRAVENOUS | Status: DC
  Filled 2024-07-12 (×2): qty 1000

## 2024-07-12 MED ORDER — POTASSIUM CHLORIDE IN NACL 20-0.9 MEQ/L-% IV SOLN
Freq: Once | INTRAVENOUS | Status: AC
  Filled 2024-07-12: qty 1000

## 2024-07-12 MED ORDER — RIVAROXABAN 20 MG PO TABS
20.0000 mg | ORAL_TABLET | Freq: Every day | ORAL | Status: DC
  Administered 2024-07-12: 20 mg via ORAL
  Filled 2024-07-12 (×2): qty 1

## 2024-07-12 MED ORDER — ALPRAZOLAM 0.25 MG PO TABS
0.2500 mg | ORAL_TABLET | Freq: Every day | ORAL | Status: DC | PRN
Start: 2024-07-12 — End: 2024-07-13

## 2024-07-12 MED ORDER — ACETAMINOPHEN 650 MG RE SUPP: 650.0000 mg | Freq: Four times a day (QID) | RECTAL | Status: DC | PRN

## 2024-07-12 MED ORDER — ROSUVASTATIN CALCIUM 10 MG PO TABS
10.0000 mg | ORAL_TABLET | Freq: Every evening | ORAL | Status: DC
  Administered 2024-07-12: 10 mg via ORAL
  Filled 2024-07-12: qty 1

## 2024-07-12 MED ORDER — POTASSIUM CHLORIDE CRYS ER 20 MEQ PO TBCR
40.0000 meq | EXTENDED_RELEASE_TABLET | Freq: Once | ORAL | Status: AC
  Administered 2024-07-12: 40 meq via ORAL
  Filled 2024-07-12: qty 2

## 2024-07-12 MED ORDER — ENSURE PLUS HIGH PROTEIN PO LIQD
237.0000 mL | Freq: Two times a day (BID) | ORAL | Status: DC
  Administered 2024-07-12 – 2024-07-13 (×2): 237 mL via ORAL

## 2024-07-12 MED ORDER — DILTIAZEM HCL ER COATED BEADS 120 MG PO CP24
120.0000 mg | ORAL_CAPSULE | Freq: Every day | ORAL | Status: DC
  Administered 2024-07-12: 120 mg via ORAL
  Filled 2024-07-12 (×2): qty 1

## 2024-07-12 MED ORDER — OYSTER SHELL CALCIUM/D3 500-5 MG-MCG PO TABS: 1.0000 | ORAL_TABLET | Freq: Every day | ORAL | Status: DC

## 2024-07-12 MED ORDER — OYSTER SHELL CALCIUM/D3 500-5 MG-MCG PO TABS
1.0000 | ORAL_TABLET | Freq: Every day | ORAL | Status: DC
  Administered 2024-07-12 – 2024-07-13 (×2): 1 via ORAL
  Filled 2024-07-12 (×2): qty 1

## 2024-07-12 MED ORDER — PANTOPRAZOLE SODIUM 40 MG PO TBEC
40.0000 mg | DELAYED_RELEASE_TABLET | Freq: Every day | ORAL | Status: DC
  Administered 2024-07-12 – 2024-07-13 (×2): 40 mg via ORAL
  Filled 2024-07-12 (×2): qty 1

## 2024-07-12 MED ORDER — POTASSIUM CHLORIDE 10 MEQ/100ML IV SOLN
10.0000 meq | INTRAVENOUS | Status: AC
  Administered 2024-07-12 (×6): 10 meq via INTRAVENOUS
  Filled 2024-07-12 (×5): qty 100

## 2024-07-12 NOTE — Discharge Instructions (Addendum)
 Do you feel isolated?  [facility] offers a [employer] that anyone can call toll free at [phone]. The friendship line is available 24 hours a day  [facility] is a Program of All-inclusive Care for the Elderly (PACE). Their mission is to promote and sustain the independence of seniors wishing to remain in the community. They provide seniors with comprehensive long-term health, social, medical and dietary care. Their program is a safe alternative to nursing home care. [phone]  [facility] Physical Address [facility] [address] [place], [place] [address] Phone: [phone]. . Online zoom yoga class, connect with others without leaving your home [facility] offers [place] dance cardio sessions for individuals via Zoom. This program provides: - Dance fitness activities Please contact program for more information. Servinganyone in need adults 18+ hiv/aids individuals families Call [phone]  Email [email or web address]@yahoo .com to get more info  [employer] offers an online [employer] to individuals where they can receive help to focus on their best health. Whether you're a [employer] member or not, the neighborhood center offers a... Main Serviceshealth education  exercise & fitness  community support services  recreation  virtual support Other Servicessupport groups Servinganyone in need adults young adults teens seniors individuals families [email or web address] to get more info  Schedule on their website  [facility] offers an array of activities for adults age 50 and over. This program provides:- Fitness and health programs- Tech classes- Activity books Main Serviceshealth education  community support services  exercise & fitness  recreation  more education Servingseniors  Call [phone]    For more resources go online to [employer] and type in you zipcode  HOLD  Cardizem  and Metoprolol  if your SBP (top number) is <120

## 2024-07-12 NOTE — Telephone Encounter (Signed)
 Update: pt was admitted from ED this morning.

## 2024-07-12 NOTE — Plan of Care (Signed)
  Problem: Education: Goal: Knowledge of General Education information will improve Description: Including pain rating scale, medication(s)/side effects and non-pharmacologic comfort measures 07/12/2024 1319 by Les Delon CROME, RN Outcome: Progressing 07/12/2024 1319 by Les Delon CROME, RN Outcome: Progressing

## 2024-07-12 NOTE — TOC CM/SW Note (Signed)
 Transition of Care [facility]) - Inpatient Brief Assessment   Patient Details  Name: Allison [name] MRN: [id number] Date of Birth: [birth year]  Transition of [facility]) CM/SW Contact:    Corean ONEIDA Haddock, RN Phone Number: 07/12/2024, 3:24 PM   Clinical Narrative:   Transition of Care [facility]) Screening Note   Patient Details  Name: Allison [name] Date of Birth: 22-Jan-1951   Transition of Care [facility]) CM/SW Contact:    Corean ONEIDA Haddock, RN Phone Number: 07/12/2024, 3:24 PM    Transition of Care Department [facility]) has reviewed patient and no TOC needs have been identified at this time. . If new patient transition needs arise, please place a TOC consult.  Per SDOH social isolation resources added to AVS    Transition of Care Asessment: Insurance and Status: Insurance coverage has been reviewed Patient has primary care physician: Yes     Prior/Current Home Services: No current home services Social Drivers of Health Review: SDOH reviewed interventions complete Readmission risk has been reviewed: Yes Transition of care needs: no transition of care needs at this time

## 2024-07-12 NOTE — Consult Note (Signed)
 PHARMACY CONSULT NOTE - FOLLOW UP  Pharmacy Consult for Electrolyte Monitoring and Replacement   Recent Labs: Potassium (mmol/L)  Date Value  07/12/2024 3.1 (L)   Magnesium  (mg/dL)  Date Value  90/80/7974 2.3   Calcium  (mg/dL)  Date Value  90/80/7974 7.9 (L)   Albumin (g/dL)  Date Value  90/80/7974 2.6 (L)   Phosphorus (mg/dL)  Date Value  90/80/7974 3.1   Sodium (mmol/L)  Date Value  07/12/2024 137     Assessment: 73 y.o. female who comes in with diarrhea for the past 2 or 3 with decreased urine output.   Goal of Therapy:  WNL  Plan:  K 3.1, give Kcl 20 mEq po x 1 and Kcl 10 mEq IV x 2 Recheck potassium 9/20 with AM labs Check BMP, Mg, and Phos with AM labs  Allison [name] ,PharmD Clinical Pharmacist 07/12/2024 4:55 PM

## 2024-07-12 NOTE — Consult Note (Signed)
 PHARMACY CONSULT NOTE - FOLLOW UP  Pharmacy Consult for Electrolyte Monitoring and Replacement   Recent Labs: Potassium (mmol/L)  Date Value  07/12/2024 2.1 (LL)   Magnesium  (mg/dL)  Date Value  90/81/7974 2.1   Calcium  (mg/dL)  Date Value  90/80/7974 7.9 (L)   Albumin (g/dL)  Date Value  90/80/7974 2.6 (L)   Phosphorus (mg/dL)  Date Value  90/80/7974 3.1   Sodium (mmol/L)  Date Value  07/12/2024 137     Assessment: 73 y.o. female who comes in with diarrhea for the past 2 or 3 with decreased urine output.   Goal of Therapy:  WNL  Plan:  K 2.1, give Kcl 40 mEq po x 1 and Kcl 10 mEq IV x 6 Recheck potassium 9/19 @ 1600 Check BMP, Mg, and Phos with AM labs  Kayla JULIANNA Blew ,PharmD Clinical Pharmacist 07/12/2024 7:03 AM

## 2024-07-12 NOTE — Progress Notes (Signed)
 [facility]  - [place] [facility]   PATIENT NAME: Allison [name]    MR#:  [id number]  DATE OF BIRTH:  1951/10/07  SUBJECTIVE:  daughter at bedside. Patient overall feels better. Diarrhea resolved. Had mostly bowel movement. Tolerating PO diet and drinking fluids. Labs look better other than low sodium.    VITALS:  Blood pressure (!) 95/58, pulse 84, temperature 97.8 F (36.6 C), temperature source Oral, resp. rate 16, height 5' 8 (1.727 m), weight 68 kg, SpO2 99%.  PHYSICAL EXAMINATION:   GENERAL:  73 y.o.-year-old patient with no acute distress.  LUNGS: Normal breath sounds bilaterally, no wheezing CARDIOVASCULAR: S1, S2 normal. No murmur   ABDOMEN: Soft, nontender, nondistended. Bowel sounds present.  EXTREMITIES: No  edema b/l.    NEUROLOGIC: nonfocal  patient is alert and awake SKIN: No obvious rash, lesion, or ulcer.   LABORATORY PANEL:  CBC Recent Labs  Lab 07/12/24 0531  WBC 4.5  HGB 13.1  HCT 38.4  PLT 202    Chemistries  Recent Labs  Lab 07/11/24 1501 07/12/24 0531  NA 136 137  K <2.0* 2.1*  CL 78* 90*  CO2 38* 35*  GLUCOSE 121* 86  BUN 17 14  CREATININE 0.91 0.58  CALCIUM  9.2 7.9*  MG 2.1 2.3  AST 61*  --   ALT 31  --   ALKPHOS 58  --   BILITOT 2.0*  --     Assessment and Plan   MARINA BOERNER is a 73 y.o. female with medical history significant of  Afibrilllation, HTN, GERD, HL, Dementia comes to the emergency room with complaints of dizziness, weakness and diarrhea on and off for last couple days. Last area was on Wednesday around 2:30 PM per daughter. Daughter is at bedside gives most history. Patient denies any recent travel home. Daughter says that time she eats stale food that could have triggered also. Patient denies any abdominal pain nausea vomiting.   Acute gastroenteritis severe hypokalemia anion gap metabolic acidosis/starvation ketosis--resolved -- patient came in with increasing diarrhea for last  couple days. None since yesterday. -- Poor PO intake for last several days per family -- found to have potassium of less than 2.0. No acute EKG changes -- received IV fluid bolus and IV runs of potassium along with oral -- pharmacy to follow electrolytes --continue IV fluids -- stool for C. diff  negative and G.I. PCR pending   Chronic a fib on anticoagulation -- continue diltiazem  and Xarelto  -- if blood pressure remains stable will resume metoprolol  tomorrow   Hyperlipidemia -- continue statins   Mild dementia -- patient will continue home meds -- follows with Dr. Jannett show as outpatient   Procedures: Family communication :dter at bedside Consults :none CODE STATUS: full DVT Prophylaxis :enoxaparin Level of care: Telemetry Medical Status is: Inpatient Remains inpatient appropriate because: severe hypokalemia    TOTAL TIME TAKING CARE OF THIS PATIENT: 35 minutes.  >50% time spent on counselling and coordination of care  Note: This dictation was prepared with Dragon dictation along with smaller phrase technology. Any transcriptional errors that result from this process are unintentional.  Leita Blanch M.D    [facility]   CC: Primary care physician; Glendia Shad, MD

## 2024-07-13 LAB — BASIC METABOLIC PANEL WITH GFR
Anion gap: 7 (ref 5–15)
BUN: 8 mg/dL (ref 8–23)
CO2: 32 mmol/L (ref 22–32)
Calcium: 8.1 mg/dL — ABNORMAL LOW (ref 8.9–10.3)
Chloride: 101 mmol/L (ref 98–111)
Creatinine, Ser: 0.42 mg/dL — ABNORMAL LOW (ref 0.44–1.00)
GFR, Estimated: >60 mL/min (ref >60)
Glucose, Bld: 97 mg/dL (ref 70–99)
Potassium: 3.2 mmol/L — ABNORMAL LOW (ref 3.5–5.1)
Sodium: 140 mmol/L (ref 135–145)

## 2024-07-13 LAB — MAGNESIUM: Magnesium: 2 mg/dL (ref 1.7–2.4)

## 2024-07-13 LAB — PHOSPHORUS: Phosphorus: 2.9 mg/dL (ref 2.5–4.6)

## 2024-07-13 MED ORDER — POTASSIUM CHLORIDE CRYS ER 20 MEQ PO TBCR: 40.0000 meq | EXTENDED_RELEASE_TABLET | Freq: Once | ORAL | Status: DC

## 2024-07-13 MED ORDER — POTASSIUM CHLORIDE ER 10 MEQ PO TBCR: 10.0000 meq | EXTENDED_RELEASE_TABLET | Freq: Every day | ORAL | 1 refills | Status: DC

## 2024-07-13 MED ORDER — POTASSIUM CHLORIDE CRYS ER 20 MEQ PO TBCR
40.0000 meq | EXTENDED_RELEASE_TABLET | Freq: Once | ORAL | Status: AC
  Administered 2024-07-13: 40 meq via ORAL
  Filled 2024-07-13: qty 2

## 2024-07-13 MED ORDER — ENSURE PLUS HIGH PROTEIN PO LIQD: 237.0000 mL | Freq: Two times a day (BID) | ORAL | 0 refills | Status: AC

## 2024-07-13 NOTE — Discharge Summary (Signed)
 Physician Discharge Summary   Patient: Allison [name] MRN: [id number] DOB: 1951-09-22  Admit date:     07/11/2024  Discharge date: 07/13/24  Discharge Physician: Leita Blanch   PCP: Glendia Shad, MD   Recommendations at discharge:    keep log of blood pressure at home. Hold BP meds if systolic blood pressure less than 120 discussed with daughter follow-up PCP in 1 to 2 weeks  Discharge Diagnoses: Principal Problem:   Diarrhea Active Problems:   Hypokalemia   Hypotension   Weakness  Allison [name] is a 73 y.o. female with medical history significant of  Afibrilllation, HTN, GERD, HL, Dementia comes to the emergency room with complaints of dizziness, weakness and diarrhea on and off for last couple days. Last area was on Wednesday around 2:30 PM per daughter. Daughter is at bedside gives most history. Patient denies any recent travel home. Daughter says that time she eats stale food that could have triggered also. Patient denies any abdominal pain nausea vomiting.   Acute gastroenteritis severe hypokalemia anion gap metabolic acidosis/starvation ketosis--resolved -- patient came in with increasing diarrhea for last couple days. None since yesterday. -- Poor PO intake for last several days per family -- found to have potassium of less than 2.0. No acute EKG changes -- received IV fluid bolus and IV runs of potassium along with oral -- pharmacy to follow electrolytes -- stool for C. diff  negative and G.I. PCR negative -- overall tolerating PO diet. Diarrhea resolved. Feels better. Ambulated well in the room   Chronic a fib on anticoagulation -- continue diltiazem  and metoprolol   -- on Xarelto    Hyperlipidemia -- continue statins   Mild dementia -- patient will continue home meds -- follows with Dr. Jannett show as outpatient    Discharge plan discussed with patient and daughter. They both are in agreement.   Procedures: Family communication :dter at  bedside Consults :none CODE STATUS: full DVT Prophylaxis :enoxaparin      Disposition: Home Diet recommendation:  Discharge Diet Orders (From admission, onward)     Start     Ordered   07/13/24 0000  Diet - low sodium heart healthy        07/13/24 9177           Cardiac diet DISCHARGE MEDICATION: Allergies as of 07/13/2024       Reactions   Iodine         Medication List     STOP taking these medications    ascorbic acid  500 MG tablet Commonly known as: VITAMIN C        TAKE these medications    ALPRAZolam  0.25 MG tablet Commonly known as: XANAX  TAKE 1 TABLET BY MOUTH ONCE DAILY AS NEEDED FOR ANXIETY   CALCIUM  500/VITAMIN D PO Take 2,000 Units by mouth daily.   diltiazem  120 MG 24 hr capsule Commonly known as: CARDIZEM  CD Take 1 capsule by mouth once daily   donepezil 5 MG tablet Commonly known as: ARICEPT Take 5 mg by mouth at bedtime.   feeding supplement Liqd Take 237 mLs by mouth 2 (two) times daily between meals.   fluticasone  50 MCG/ACT nasal spray Commonly known as: FLONASE  Place 2 sprays into both nostrils daily.   metoprolol  succinate 50 MG 24 hr tablet Commonly known as: TOPROL -XL TAKE 1 & 1/2 (ONE & ONE-HALF) TABLETS BY MOUTH ONCE DAILY WITH OR IMMEDIATELY FOLLOWING MEAL   omeprazole  20 MG capsule Commonly known as: PRILOSEC Take 1 capsule by mouth once  daily   potassium chloride  10 MEQ tablet Commonly known as: KLOR-CON  Take 1 tablet (10 mEq total) by mouth daily.   rosuvastatin  10 MG tablet Commonly known as: CRESTOR  Take 1 tablet by mouth once daily   Xarelto  20 MG Tabs tablet Generic drug: rivaroxaban  TAKE 1 TABLET BY MOUTH ONCE DAILY WITH SUPPER        Follow-up Information     Glendia Shad, MD. Schedule an appointment as soon as possible for a visit in 1 week(s).   Specialty: Internal Medicine Contact information: [address] [place] [address] [phone]                 Discharge Exam: Fredricka Weights   07/11/24 1459  Weight: 68 kg   GENERAL:  73 y.o.-year-old patient with no acute distress.  LUNGS: Normal breath sounds bilaterally, no wheezing CARDIOVASCULAR: S1, S2 normal. No murmur   ABDOMEN: Soft, nontender, nondistended. Bowel sounds present.  EXTREMITIES: No  edema b/l.    NEUROLOGIC: nonfocal  patient is alert and awake  Condition at discharge: fair  The results of significant diagnostics from this hospitalization (including imaging, microbiology, ancillary and laboratory) are listed below for reference.   Imaging Studies: No results found.  Microbiology: Results for orders placed or performed during the hospital encounter of 07/11/24  Gastrointestinal Panel by PCR , Stool     Status: None   Collection Time: 07/12/24 10:36 AM   Specimen: Stool  Result Value Ref Range Status   Campylobacter species NOT DETECTED NOT DETECTED Final   Plesimonas shigelloides NOT DETECTED NOT DETECTED Final   Salmonella species NOT DETECTED NOT DETECTED Final   Yersinia enterocolitica NOT DETECTED NOT DETECTED Final   Vibrio species NOT DETECTED NOT DETECTED Final   Vibrio cholerae NOT DETECTED NOT DETECTED Final   Enteroaggregative E coli (EAEC) NOT DETECTED NOT DETECTED Final   Enteropathogenic E coli (EPEC) NOT DETECTED NOT DETECTED Final   Enterotoxigenic E coli (ETEC) NOT DETECTED NOT DETECTED Final   Shiga like toxin producing E coli (STEC) NOT DETECTED NOT DETECTED Final   Shigella/Enteroinvasive E coli (EIEC) NOT DETECTED NOT DETECTED Final   Cryptosporidium NOT DETECTED NOT DETECTED Final   Cyclospora cayetanensis NOT DETECTED NOT DETECTED Final   Entamoeba histolytica NOT DETECTED NOT DETECTED Final   Giardia lamblia NOT DETECTED NOT DETECTED Final   Adenovirus F40/41 NOT DETECTED NOT DETECTED Final   Astrovirus NOT DETECTED NOT DETECTED Final   Norovirus GI/GII NOT DETECTED NOT DETECTED Final   Rotavirus A NOT DETECTED NOT DETECTED  Final   Sapovirus (I, II, IV, and V) NOT DETECTED NOT DETECTED Final    Comment: Performed at [facility], [address] [place], [place] [address]  C Difficile Quick Screen w PCR reflex     Status: None   Collection Time: 07/12/24 10:36 AM   Specimen: STOOL  Result Value Ref Range Status   C Diff antigen NEGATIVE NEGATIVE Final   C Diff toxin NEGATIVE NEGATIVE Final   C Diff interpretation No C. difficile detected.  Final    Comment: Performed at [facility], [address] [place], [place] [address]    Labs: CBC: Recent Labs  Lab 07/11/24 1501 07/12/24 0531  WBC 5.5 4.5  HGB 16.4* 13.1  HCT 47.5* 38.4  MCV 89.8 90.1  PLT 251 202   Basic Metabolic Panel: Recent Labs  Lab 07/11/24 1501 07/12/24 0531 07/12/24 1627 07/13/24 0710  NA 136 137  --  140  K <2.0* 2.1* 3.1* 3.2*  CL 78* 90*  --  101  CO2 38* 35*  --  32  GLUCOSE 121* 86  --  97  BUN 17 14  --  8  CREATININE 0.91 0.58  --  0.42*  CALCIUM  9.2 7.9*  --  8.1*  MG 2.1 2.3  --  2.0  PHOS  --  3.1  --  2.9   Liver Function Tests: Recent Labs  Lab 07/11/24 1501 07/12/24 0531  AST 61*  --   ALT 31  --   ALKPHOS 58  --   BILITOT 2.0*  --   PROT 6.8  --   ALBUMIN 3.6 2.6*   CBG: No results for input(s): GLUCAP in the last 168 hours.  Discharge time spent: greater than 30 minutes.  Signed: Leita Blanch, MD [facility] 07/13/2024

## 2024-07-13 NOTE — Consult Note (Signed)
 PHARMACY CONSULT NOTE - FOLLOW UP  Pharmacy Consult for Electrolyte Monitoring and Replacement   Recent Labs: Potassium (mmol/L)  Date Value  07/13/2024 3.2 (L)   Magnesium  (mg/dL)  Date Value  90/79/7974 2.0   Calcium  (mg/dL)  Date Value  90/79/7974 8.1 (L)   Albumin (g/dL)  Date Value  90/80/7974 2.6 (L)   Phosphorus (mg/dL)  Date Value  90/79/7974 2.9   Sodium (mmol/L)  Date Value  07/13/2024 140     Assessment: 73 y.o. female who comes in with diarrhea for the past 2 or 3 with decreased urine output.   Goal of Therapy:  WNL  Plan:  Kcl 40 mEq x 2  F/u with AM lab.   Cathaleen GORMAN Blanch ,PharmD Clinical Pharmacist 07/13/2024 7:51 AM

## 2024-07-13 NOTE — Progress Notes (Signed)
 Patient to discharge home. PIV removed. Instructions and follow up information provided to patient.No new medications prescribed at this time. Questions answered. No concerns voiced.

## 2024-07-15 ENCOUNTER — Encounter: Payer: Self-pay | Admitting: Internal Medicine

## 2024-07-15 ENCOUNTER — Telehealth: Payer: Self-pay

## 2024-07-15 DIAGNOSIS — M79676 Pain in unspecified toe(s): Secondary | ICD-10-CM

## 2024-07-15 NOTE — Telephone Encounter (Signed)
 Transition Care Management Follow-up Telephone Call Date of discharge and from where: 07/13/24 [facility]  How have you been since you were released from the hospital? Better Any questions or concerns? No  Items Reviewed: Did the pt receive and understand the discharge instructions provided? Yes  Medications obtained and verified? Yes  Other?  Any new allergies since your discharge? No  Dietary orders reviewed? Yes Do you have support at home? Yes   Home Care and Equipment/Supplies: Were home health services ordered? no If so, what is the name of the agency?   Has the agency set up a time to come to the patient's home? not applicable Were any new equipment or medical supplies ordered?  No What is the name of the medical supply agency?  Were you able to get the supplies/equipment? not applicable Do you have any questions related to the use of the equipment or supplies? No  Functional Questionnaire: (I = Independent and D = Dependent) ADLs: I  Bathing/Dressing- I  Meal Prep- I  Eating- I  Maintaining continence- I  Transferring/Ambulation- I  Managing Meds- D  Follow up appointments reviewed:  PCP Hospital f/u appt confirmed? Yes  Scheduled to see Dr. Allena Hamilton on 07/18/24 @ 11:00. Specialist Hospital f/u appt confirmed? N/a  Are transportation arrangements needed? No  If their condition worsens, is the pt aware to call PCP or go to the Emergency Dept.? Yes Was the patient provided with contact information for the PCP's office or ED? Yes Was to pt encouraged to call back with questions or concerns? Yes

## 2024-07-15 NOTE — Telephone Encounter (Signed)
 She will need podiatry referral. Need to confirm if pain around toe nail (or specifics to have a diagnosis for referral).

## 2024-07-15 NOTE — Telephone Encounter (Signed)
 Order placed for podiatry referral. My chart message sent to pt and pts daughter.

## 2024-07-18 ENCOUNTER — Ambulatory Visit (INDEPENDENT_AMBULATORY_CARE_PROVIDER_SITE_OTHER): Admitting: Internal Medicine

## 2024-07-18 ENCOUNTER — Ambulatory Visit: Payer: Self-pay | Admitting: Internal Medicine

## 2024-07-18 ENCOUNTER — Encounter: Payer: Self-pay | Admitting: Internal Medicine

## 2024-07-18 VITALS — BP 100/68 | HR 97 | Resp 16 | Ht 68.0 in | Wt 134.6 lb

## 2024-07-18 DIAGNOSIS — I480 Paroxysmal atrial fibrillation: Secondary | ICD-10-CM | POA: Diagnosis not present

## 2024-07-18 DIAGNOSIS — I10 Essential (primary) hypertension: Secondary | ICD-10-CM | POA: Diagnosis not present

## 2024-07-18 DIAGNOSIS — K219 Gastro-esophageal reflux disease without esophagitis: Secondary | ICD-10-CM

## 2024-07-18 DIAGNOSIS — R531 Weakness: Secondary | ICD-10-CM

## 2024-07-18 DIAGNOSIS — E041 Nontoxic single thyroid nodule: Secondary | ICD-10-CM | POA: Diagnosis not present

## 2024-07-18 DIAGNOSIS — R413 Other amnesia: Secondary | ICD-10-CM

## 2024-07-18 DIAGNOSIS — E7800 Pure hypercholesterolemia, unspecified: Secondary | ICD-10-CM | POA: Diagnosis not present

## 2024-07-18 DIAGNOSIS — E876 Hypokalemia: Secondary | ICD-10-CM

## 2024-07-18 DIAGNOSIS — R634 Abnormal weight loss: Secondary | ICD-10-CM | POA: Diagnosis not present

## 2024-07-18 DIAGNOSIS — R739 Hyperglycemia, unspecified: Secondary | ICD-10-CM

## 2024-07-18 DIAGNOSIS — R197 Diarrhea, unspecified: Secondary | ICD-10-CM | POA: Diagnosis not present

## 2024-07-18 LAB — CBC WITH DIFFERENTIAL/PLATELET
Basophils Absolute: 0 K/uL (ref 0.0–0.1)
Basophils Relative: 0.9 % (ref 0.0–3.0)
Eosinophils Absolute: 0.1 K/uL (ref 0.0–0.7)
Eosinophils Relative: 1.2 % (ref 0.0–5.0)
HCT: 44.5 % (ref 36.0–46.0)
Hemoglobin: 14.9 g/dL (ref 12.0–15.0)
Lymphocytes Relative: 15.9 % (ref 12.0–46.0)
Lymphs Abs: 0.9 K/uL (ref 0.7–4.0)
MCHC: 33.4 g/dL (ref 30.0–36.0)
MCV: 93.5 fl (ref 78.0–100.0)
Monocytes Absolute: 0.6 K/uL (ref 0.1–1.0)
Monocytes Relative: 11.6 % (ref 3.0–12.0)
Neutro Abs: 3.8 K/uL (ref 1.4–7.7)
Neutrophils Relative %: 70.4 % (ref 43.0–77.0)
Platelets: 280 K/uL (ref 150.0–400.0)
RBC: 4.76 Mil/uL (ref 3.87–5.11)
RDW: 15.6 % — ABNORMAL HIGH (ref 11.5–15.5)
WBC: 5.4 K/uL (ref 4.0–10.5)

## 2024-07-18 LAB — HEPATIC FUNCTION PANEL
ALT: 34 U/L (ref 0–35)
AST: 56 U/L — ABNORMAL HIGH (ref 0–37)
Albumin: 3.8 g/dL (ref 3.5–5.2)
Alkaline Phosphatase: 51 U/L (ref 39–117)
Bilirubin, Direct: 0.2 mg/dL (ref 0.0–0.3)
Total Bilirubin: 0.8 mg/dL (ref 0.2–1.2)
Total Protein: 6.4 g/dL (ref 6.0–8.3)

## 2024-07-18 LAB — LIPID PANEL
Cholesterol: 182 mg/dL (ref 0–200)
HDL: 56.1 mg/dL (ref >39.00)
LDL Cholesterol: 100 mg/dL — ABNORMAL HIGH (ref 0–99)
NonHDL: 125.46
Total CHOL/HDL Ratio: 3
Triglycerides: 128 mg/dL (ref 0.0–149.0)
VLDL: 25.6 mg/dL (ref 0.0–40.0)

## 2024-07-18 LAB — BASIC METABOLIC PANEL WITH GFR
BUN: 12 mg/dL (ref 6–23)
CO2: 31 meq/L (ref 19–32)
Calcium: 9.8 mg/dL (ref 8.4–10.5)
Chloride: 99 meq/L (ref 96–112)
Creatinine, Ser: 0.73 mg/dL (ref 0.40–1.20)
GFR: 82.47 mL/min (ref >60.00)
Glucose, Bld: 123 mg/dL — ABNORMAL HIGH (ref 70–99)
Potassium: 4.1 meq/L (ref 3.5–5.1)
Sodium: 140 meq/L (ref 135–145)

## 2024-07-18 LAB — HEMOGLOBIN A1C: Hgb A1c MFr Bld: 6.1 % (ref 4.6–6.5)

## 2024-07-18 LAB — TSH: TSH: 0.88 u[IU]/mL (ref 0.35–5.50)

## 2024-07-18 LAB — T4, FREE: Free T4: 1.05 ng/dL (ref 0.60–1.60)

## 2024-07-18 NOTE — Telephone Encounter (Signed)
 I would hold on giving her xanax . If increased stress, etc is an issue - can discuss other treatment. Hold now and allow her to recover from recent hospitalization. Had reported she was doing better. Please give her the information we have for med alert and the other phone number.  Thanks.

## 2024-07-18 NOTE — Progress Notes (Signed)
 Subjective:    Patient ID: Allison [name], female    DOB: 11/12/1950, 73 y.o.   MRN: [id number]  Patient here for  Chief Complaint  Patient presents with   Hospitalization Follow-up    HPI Here for hospital follow up. Admitted 07/11/24 - 07/13/24 - after presenting with dizziness, weakness and diarrhea. Diagnosed with acute gastroenteritis and severe hypokalemia with anion gap metabolic acidosis. Received IVFs and IV runs of potassium, along with oral replacement. Stool for c.diff negative. GI PCR negative. Diarrhea resolved. Tolerating po diet. Is off diltiazem  and metoprolol  currently. Was instructed to hold if blood pressure below 120 systolic. Blood pressure ahs been low 100s. Potassium 3.2 on discharge. She comes in today accompanied by her daughter. History obtained from both of them. She has a f/u scheduled with cardiology next week. Reports significant weight loss. In reviewing, she was 167 lbs in 07-2023. She is eating some. Denies chest pain. Reports breathing stable. No abdominal pain reported.    Past Medical History:  Diagnosis Date   A-fib [facility])    GERD (gastroesophageal reflux disease)    Hypercholesterolemia    Hypertension    Migraine headache    Psoriasis    Past Surgical History:  Procedure Laterality Date   COLONOSCOPY WITH PROPOFOL  N/A 12/23/2016   Procedure: COLONOSCOPY WITH PROPOFOL ;  Surgeon: Gladis RAYMOND Mariner, MD;  Location: [facility] ENDOSCOPY;  Service: Endoscopy;  Laterality: N/A;   DILATION AND CURETTAGE OF UTERUS     history of abnormal bleeding   TUBAL LIGATION     Family History  Problem Relation Age of Onset   Asthma Father    Diabetes Mother    CVA Mother    Psoriasis Mother    Breast cancer Maternal Grandmother    Breast cancer Paternal Grandmother    Colon cancer Maternal Uncle    Colon cancer Other        nephew   Diabetes Other        multiple relatives (both sides)   Stroke Brother        Light stroke   Diabetes Brother    Social  History   Socioeconomic History   Marital status: Single    Spouse name: Not on file   Number of children: 1   Years of education: Not on file   Highest education level: Not on file  Occupational History   Not on file  Tobacco Use   Smoking status: Never   Smokeless tobacco: Never  Vaping Use   Vaping status: Never Used  Substance and Sexual Activity   Alcohol use: No    Alcohol/week: 0.0 standard drinks of alcohol   Drug use: No   Sexual activity: Not on file  Other Topics Concern   Not on file  Social History Narrative   Not on file   Social Drivers of Health   Financial Resource Strain: Low Risk  (02/20/2023)   Overall Financial Resource Strain (CARDIA)    Difficulty of Paying Living Expenses: Not hard at all  Food Insecurity: No Food Insecurity (07/12/2024)   Hunger Vital Sign    Worried About Running Out of Food in the Last Year: Never true    Ran Out of Food in the Last Year: Never true  Transportation Needs: No Transportation Needs (07/12/2024)   PRAPARE - Administrator, Civil Service (Medical): No    Lack of Transportation (Non-Medical): No  Physical Activity: Sufficiently Active (02/20/2023)   Exercise Vital Sign  Days of Exercise per Week: 5 days    Minutes of Exercise per Session: 30 min  Stress: No Stress Concern Present (02/20/2023)   [employer] of Occupational Health - Occupational Stress Questionnaire    Feeling of Stress : Not at all  Social Connections: Socially Isolated (07/12/2024)   Social Connection and Isolation Panel    Frequency of Communication with Friends and Family: Three times a week    Frequency of Social Gatherings with Friends and Family: Once a week    Attends Religious Services: Never    Database administrator or Organizations: No    Attends Banker Meetings: Never    Marital Status: Divorced     Review of Systems  Constitutional:        Weight down as outlined. Some increased fatigue.   HENT:   Negative for congestion and sinus pressure.   Respiratory:  Negative for cough, chest tightness and shortness of breath.   Cardiovascular:  Negative for chest pain, palpitations and leg swelling.  Gastrointestinal:  Negative for abdominal pain and vomiting.       Diarrhea resolved.   Genitourinary:  Negative for difficulty urinating and dysuria.  Musculoskeletal:  Negative for joint swelling and myalgias.  Skin:  Negative for color change and rash.  Neurological:  Negative for dizziness and headaches.       Some weakness from being sick and in the hospital.   Psychiatric/Behavioral:  Negative for agitation and dysphoric mood.        Objective:     BP 100/68   Pulse 97   Resp 16   Ht 5' 8 (1.727 m)   Wt 134 lb 9.6 oz (61.1 kg)   SpO2 98%   BMI 20.47 kg/m  Wt Readings from Last 3 Encounters:  07/21/24 134 lb 7.7 oz (61 kg)  07/18/24 134 lb 9.6 oz (61.1 kg)  07/11/24 150 lb (68 kg)    Physical Exam Vitals reviewed.  Constitutional:      General: She is not in acute distress.    Appearance: Normal appearance.  HENT:     Head: Normocephalic and atraumatic.     Right Ear: External ear normal.     Left Ear: External ear normal.     Mouth/Throat:     Pharynx: No oropharyngeal exudate or posterior oropharyngeal erythema.  Eyes:     General: No scleral icterus.       Right eye: No discharge.        Left eye: No discharge.     Conjunctiva/sclera: Conjunctivae normal.  Neck:     Thyroid : No thyromegaly.  Cardiovascular:     Rate and Rhythm: Normal rate and regular rhythm.     Comments: Appeared to be regular - 92-96 Pulmonary:     Effort: No respiratory distress.     Breath sounds: Normal breath sounds. No wheezing.  Abdominal:     General: Bowel sounds are normal.     Palpations: Abdomen is soft.     Tenderness: There is no abdominal tenderness.  Musculoskeletal:        General: No swelling or tenderness.     Cervical back: Neck supple. No tenderness.   Lymphadenopathy:     Cervical: No cervical adenopathy.  Skin:    Findings: No erythema or rash.  Neurological:     Mental Status: She is alert.  Psychiatric:        Mood and Affect: Mood normal.  Behavior: Behavior normal.         No facility-administered encounter medications on file as of 07/18/2024.   Outpatient Encounter Medications as of 07/18/2024  Medication Sig   ALPRAZolam  (XANAX ) 0.25 MG tablet TAKE 1 TABLET BY MOUTH ONCE DAILY AS NEEDED FOR ANXIETY   Calcium  Carb-Cholecalciferol  (CALCIUM  500/VITAMIN D PO) Take 2,000 Units by mouth daily.   donepezil (ARICEPT) 5 MG tablet Take 5 mg by mouth at bedtime.   feeding supplement (ENSURE PLUS HIGH PROTEIN) LIQD Take 237 mLs by mouth 2 (two) times daily between meals.   potassium chloride  (KLOR-CON ) 10 MEQ tablet Take 1 tablet (10 mEq total) by mouth daily.   rivaroxaban  (XARELTO ) 20 MG TABS tablet TAKE 1 TABLET BY MOUTH ONCE DAILY WITH SUPPER   rosuvastatin  (CRESTOR ) 10 MG tablet Take 1 tablet by mouth once daily   [DISCONTINUED] diltiazem  (CARDIZEM  CD) 120 MG 24 hr capsule Take 1 capsule by mouth once daily   [DISCONTINUED] fluticasone  (FLONASE ) 50 MCG/ACT nasal spray Place 2 sprays into both nostrils daily.   [DISCONTINUED] metoprolol  succinate (TOPROL -XL) 50 MG 24 hr tablet TAKE 1 & 1/2 (ONE & ONE-HALF) TABLETS BY MOUTH ONCE DAILY WITH OR IMMEDIATELY FOLLOWING MEAL   [DISCONTINUED] omeprazole  (PRILOSEC) 20 MG capsule Take 1 capsule by mouth once daily     Lab Results  Component Value Date   WBC 6.0 07/21/2024   HGB 15.0 07/21/2024   HCT 43.1 07/21/2024   PLT 313 07/21/2024   GLUCOSE 118 (H) 07/21/2024   CHOL 182 07/18/2024   TRIG 128.0 07/18/2024   HDL 56.10 07/18/2024   LDLCALC 100 (H) 07/18/2024   ALT 34 07/18/2024   AST 56 (H) 07/18/2024   NA 138 07/21/2024   K 3.7 07/21/2024   CL 102 07/21/2024   CREATININE 0.52 07/21/2024   BUN 11 07/21/2024   CO2 22 07/21/2024   TSH 0.88 07/18/2024   INR 2.3 (H)  12/13/2020   HGBA1C 6.1 07/18/2024        Assessment & Plan:  Diarrhea, unspecified type Assessment & Plan: Recently admitted as outlined. Diarrhea has resolved now. Was found to have significantly low potassium. Given supplementation in the hospital. Discharge potassium - 3.2. will recheck today.    Hypercholesterolemia Assessment & Plan: On Crestor .  Follow lipid panel and liver function tests.    Orders: -     CBC with Differential/Platelet -     TSH -     Lipid panel -     Hepatic function panel  Hyperglycemia Assessment & Plan: Follow met b and A1c.   Orders: -     Hemoglobin A1c  Primary hypertension Assessment & Plan: Currently off metoprolol  and diltiazem  as outlined. Blood pressure remaining in the low 100s as outlined. Follow pressures and heart rate. May need to add back low dose. Due to f/u with cardiology 07/22/24.   Orders: -     T4, free -     Basic metabolic panel with GFR  Gastroesophageal reflux disease, unspecified whether esophagitis present Assessment & Plan: No acid reflux. Does not feel needs PPI. Can hold. Follow for any upper GI symptoms.    Weight loss Assessment & Plan: Significant amount of weight loss over this past year. Denies any abdominal pain. Recent hospitalization for increased diarrhea. Diarrhea resolved. Discussed further w/up for weight loss. Check labs. Also, refer to GI for further evaluation and question of need for luminal evaluation. Encourage increased po intake. Will place order for home health. See if  qualifies for Meals on Wheels.   Orders: -     Ambulatory referral to Gastroenterology -     Ambulatory referral to Home Health  Weakness Assessment & Plan: Some weakness with recent increased diarrhea and recent hospitalization. Will have home health PT evaluate and treat.   Orders: -     Ambulatory referral to Home Health  Thyroid  nodule Assessment & Plan: Saw endocrinology 05/15/23 - thyroid  mass. Ultrasound  thyroid  06/08/23 - 3.6cm solid nodule.  Also left sided 1.0 cm nodule. Recommended FNA of the 3.6 cm mass. Ultrasound guided biopsy 07/05/23. Will need to confirm f/u.    Paroxysmal atrial fibrillation (HCC) Assessment & Plan: Continue xarelto .  Is currently off cardizem  CD 120mg  q day and toprol  q day as instructed on discharge given lower blood pressure. At discharge was instructed not to take these medications if blood pressure remained <120 systolic. Has been monitoring blood pressure and has been remaining low 100s. Appears to be in SR today. Ventricular rate in the 90s. Has diltiazem  30mg  prn to take if needed. Due to see cardiology Monday 07/22/24. May need to add back low dose. Continue to follow blood pressure and heart rate. Follow.     Memory change Assessment & Plan: Saw neurology 02/2024. W/up in progress. Recommended MRI. Request something to help her with MRI. Discuss with neurology.   Orders: -     Ambulatory referral to Home Health  Hypokalemia Assessment & Plan: Low potassium during recent hospitalization. Replaced while in hospital. Discharge potassium 3.2. recheck today.       Allena Hamilton, MD

## 2024-07-21 ENCOUNTER — Observation Stay (HOSPITAL_BASED_OUTPATIENT_CLINIC_OR_DEPARTMENT_OTHER)
Admit: 2024-07-21 | Discharge: 2024-07-21 | Disposition: A | Discharge status: Home / Self Care | Attending: Internal Medicine | Admitting: Internal Medicine

## 2024-07-21 ENCOUNTER — Encounter: Payer: Self-pay | Admitting: Emergency Medicine

## 2024-07-21 ENCOUNTER — Other Ambulatory Visit: Payer: Self-pay

## 2024-07-21 ENCOUNTER — Emergency Department

## 2024-07-21 ENCOUNTER — Inpatient Hospital Stay
Admission: EM | Admit: 2024-07-21 | Discharge: 2024-07-25 | DRG: 309 | Disposition: A | Discharge status: Home Health | Attending: Family Medicine | Admitting: Family Medicine

## 2024-07-21 ENCOUNTER — Encounter: Payer: Self-pay | Admitting: Internal Medicine

## 2024-07-21 DIAGNOSIS — I480 Paroxysmal atrial fibrillation: Secondary | ICD-10-CM | POA: Diagnosis present

## 2024-07-21 DIAGNOSIS — K219 Gastro-esophageal reflux disease without esophagitis: Secondary | ICD-10-CM | POA: Diagnosis present

## 2024-07-21 DIAGNOSIS — Z7901 Long term (current) use of anticoagulants: Secondary | ICD-10-CM

## 2024-07-21 DIAGNOSIS — I808 Phlebitis and thrombophlebitis of other sites: Secondary | ICD-10-CM | POA: Diagnosis present

## 2024-07-21 DIAGNOSIS — Z604 Social exclusion and rejection: Secondary | ICD-10-CM | POA: Diagnosis present

## 2024-07-21 DIAGNOSIS — I4891 Unspecified atrial fibrillation: Secondary | ICD-10-CM

## 2024-07-21 DIAGNOSIS — I10 Essential (primary) hypertension: Secondary | ICD-10-CM | POA: Diagnosis present

## 2024-07-21 DIAGNOSIS — Z9851 Tubal ligation status: Secondary | ICD-10-CM | POA: Diagnosis not present

## 2024-07-21 DIAGNOSIS — E872 Acidosis, unspecified: Secondary | ICD-10-CM | POA: Diagnosis present

## 2024-07-21 DIAGNOSIS — Z888 Allergy status to other drugs, medicaments and biological substances status: Secondary | ICD-10-CM

## 2024-07-21 DIAGNOSIS — Z79899 Other long term (current) drug therapy: Secondary | ICD-10-CM

## 2024-07-21 DIAGNOSIS — I959 Hypotension, unspecified: Secondary | ICD-10-CM | POA: Diagnosis present

## 2024-07-21 DIAGNOSIS — E7800 Pure hypercholesterolemia, unspecified: Secondary | ICD-10-CM | POA: Diagnosis present

## 2024-07-21 DIAGNOSIS — R002 Palpitations: Secondary | ICD-10-CM | POA: Diagnosis present

## 2024-07-21 DIAGNOSIS — I4892 Unspecified atrial flutter: Secondary | ICD-10-CM | POA: Diagnosis present

## 2024-07-21 DIAGNOSIS — I82612 Acute embolism and thrombosis of superficial veins of left upper extremity: Secondary | ICD-10-CM | POA: Diagnosis not present

## 2024-07-21 DIAGNOSIS — M7989 Other specified soft tissue disorders: Secondary | ICD-10-CM | POA: Diagnosis not present

## 2024-07-21 DIAGNOSIS — F03A0 Unspecified dementia, mild, without behavioral disturbance, psychotic disturbance, mood disturbance, and anxiety: Secondary | ICD-10-CM | POA: Diagnosis present

## 2024-07-21 DIAGNOSIS — Z833 Family history of diabetes mellitus: Secondary | ICD-10-CM | POA: Diagnosis not present

## 2024-07-21 DIAGNOSIS — I4819 Other persistent atrial fibrillation: Secondary | ICD-10-CM | POA: Diagnosis not present

## 2024-07-21 LAB — BASIC METABOLIC PANEL WITH GFR
Anion gap: 14 (ref 5–15)
BUN: 11 mg/dL (ref 8–23)
CO2: 22 mmol/L (ref 22–32)
Calcium: 8.9 mg/dL (ref 8.9–10.3)
Chloride: 102 mmol/L (ref 98–111)
Creatinine, Ser: 0.52 mg/dL (ref 0.44–1.00)
GFR, Estimated: >60 mL/min (ref >60)
Glucose, Bld: 118 mg/dL — ABNORMAL HIGH (ref 70–99)
Potassium: 3.7 mmol/L (ref 3.5–5.1)
Sodium: 138 mmol/L (ref 135–145)

## 2024-07-21 LAB — CBC
HCT: 43.1 % (ref 36.0–46.0)
Hemoglobin: 15 g/dL (ref 12.0–15.0)
MCH: 31.6 pg (ref 26.0–34.0)
MCHC: 34.8 g/dL (ref 30.0–36.0)
MCV: 90.9 fL (ref 80.0–100.0)
Platelets: 313 K/uL (ref 150–400)
RBC: 4.74 MIL/uL (ref 3.87–5.11)
RDW: 14.9 % (ref 11.5–15.5)
WBC: 6 K/uL (ref 4.0–10.5)
nRBC: 0 % (ref 0.0–0.2)

## 2024-07-21 LAB — TROPONIN I (HIGH SENSITIVITY)
Troponin I (High Sensitivity): 33 ng/L — ABNORMAL HIGH (ref <18)
Troponin I (High Sensitivity): 27 ng/L — ABNORMAL HIGH (ref <18)

## 2024-07-21 LAB — ECHOCARDIOGRAM COMPLETE
AR max vel: 4.46 cm2
AV Peak grad: 3.1 mmHg
Ao pk vel: 0.89 m/s
Area-P 1/2: 5.09 cm2
Calc EF: 64.2 %
Height: 68 in
S' Lateral: 2.56 cm
Single Plane A2C EF: 62.6 %
Single Plane A4C EF: 64.8 %
Weight: 2151.69 [oz_av]

## 2024-07-21 MED ORDER — ROSUVASTATIN CALCIUM 10 MG PO TABS
10.0000 mg | ORAL_TABLET | Freq: Every day | ORAL | Status: DC
  Administered 2024-07-21 – 2024-07-25 (×5): 10 mg via ORAL
  Filled 2024-07-21 (×5): qty 1

## 2024-07-21 MED ORDER — DONEPEZIL HCL 5 MG PO TABS
5.0000 mg | ORAL_TABLET | Freq: Every day | ORAL | Status: DC
  Administered 2024-07-21 – 2024-07-24 (×4): 5 mg via ORAL
  Filled 2024-07-21 (×5): qty 1

## 2024-07-21 MED ORDER — DILTIAZEM HCL 25 MG/5ML IV SOLN
5.0000 mg | Freq: Once | INTRAVENOUS | Status: AC
Start: 2024-07-21 — End: 2024-07-21
  Administered 2024-07-21: 5 mg via INTRAVENOUS
  Filled 2024-07-21: qty 5

## 2024-07-21 MED ORDER — DILTIAZEM HCL 30 MG PO TABS
30.0000 mg | ORAL_TABLET | Freq: Four times a day (QID) | ORAL | Status: DC
  Administered 2024-07-21 – 2024-07-22 (×3): 30 mg via ORAL
  Filled 2024-07-21 (×3): qty 1

## 2024-07-21 MED ORDER — DILTIAZEM HCL-DEXTROSE 125-5 MG/125ML-% IV SOLN (PREMIX)
5.0000 mg/h | INTRAVENOUS | Status: DC
  Administered 2024-07-21: 5 mg/h via INTRAVENOUS
  Filled 2024-07-21: qty 125

## 2024-07-21 MED ORDER — ONDANSETRON HCL 4 MG PO TABS: 4.0000 mg | ORAL_TABLET | Freq: Four times a day (QID) | ORAL | Status: DC | PRN

## 2024-07-21 MED ORDER — SODIUM CHLORIDE 0.9 % IV BOLUS
1000.0000 mL | Freq: Once | INTRAVENOUS | Status: AC
  Administered 2024-07-21: 1000 mL via INTRAVENOUS

## 2024-07-21 MED ORDER — ALPRAZOLAM 0.25 MG PO TABS: 0.2500 mg | ORAL_TABLET | Freq: Every day | ORAL | Status: DC | PRN

## 2024-07-21 MED ORDER — ONDANSETRON HCL 4 MG/2ML IJ SOLN: 4.0000 mg | Freq: Four times a day (QID) | INTRAMUSCULAR | Status: DC | PRN

## 2024-07-21 MED ORDER — RIVAROXABAN 20 MG PO TABS
20.0000 mg | ORAL_TABLET | Freq: Every day | ORAL | Status: DC
Start: 2024-07-21 — End: 2024-07-25
  Administered 2024-07-21 – 2024-07-24 (×4): 20 mg via ORAL
  Filled 2024-07-21 (×5): qty 1

## 2024-07-21 MED ORDER — SODIUM CHLORIDE 0.9 % IV SOLN: INTRAVENOUS | Status: AC

## 2024-07-21 NOTE — ED Notes (Signed)
 Pt able to use bedside commode with 1 person assist. Pt tolerated activity without difficulty.

## 2024-07-21 NOTE — Assessment & Plan Note (Signed)
 No acid reflux. Does not feel needs PPI. Can hold. Follow for any upper GI symptoms.

## 2024-07-21 NOTE — ED Triage Notes (Signed)
 Pt in via POV, complaints of intermittent heart palpitations x 1 day.  Per daughter, her HR has been hight and BP low all day, states she has a log of it.  Reports hx of Afibb; denies any chest pain.    A/Ox4, NAD noted at this time.    EKG shows Afibb RVR w/ hypotension.

## 2024-07-21 NOTE — H&P (Addendum)
 History and Physical    Patient: Allison [name] FMW:[id number] DOB: 1951/10/19 DOA: 07/21/2024 DOS: the patient was seen and examined on 07/21/2024 PCP: Glendia Shad, MD  Patient coming from: Home  Chief Complaint:  Chief Complaint  Patient presents with   Palpitations    HPI: Allison [name] is a 73 y.o. female with medical history significant for Afibrilllation on Xarelto , HTN, GERD, HL, Dementia, with recent hospitalization (9/18 - 07/13/2024) for acute diarrhea with anion gap metabolic acidosis, being admitted with A-fib with RVR .   She presented to the ED with a 1 day complaint of intermittent heart palpitations and low blood pressure readings at home.  She denies chest pain or shortness of breath. Daughter states she was advised to hold diltiazem  and metoprolol  for SBP under 120 so she has not received it since discharge from the hospital since her blood pressure never got above 117.. On arrival to the ED heart rate 164 with BP 80/62, afebrile with O2 sat 95% on room air. Labs notable for troponin of 27.  CBC and BMP within normal limits.Next EKG showed A-fib at 151 Chest x-ray clear  Cardioversion was offered due to unstable A-fib however family declined She was treated with NS bolus x 2, diltiazem  5 mg IV bolus and subsequently placed on a diltiazem  infusion. Her heart rate improved to one teens at the time of admission Observation requested .     Past Medical History:  Diagnosis Date   A-fib [facility])    GERD (gastroesophageal reflux disease)    Hypercholesterolemia    Hypertension    Migraine headache    Psoriasis    Past Surgical History:  Procedure Laterality Date   COLONOSCOPY WITH PROPOFOL  N/A 12/23/2016   Procedure: COLONOSCOPY WITH PROPOFOL ;  Surgeon: Gladis RAYMOND Mariner, MD;  Location: [facility] ENDOSCOPY;  Service: Endoscopy;  Laterality: N/A;   DILATION AND CURETTAGE OF UTERUS     history of abnormal bleeding   TUBAL LIGATION     Social History:  reports  that she has never smoked. She has never used smokeless tobacco. She reports that she does not drink alcohol and does not use drugs.  Allergies  Allergen Reactions   Iodine     Family History  Problem Relation Age of Onset   Asthma Father    Diabetes Mother    CVA Mother    Psoriasis Mother    Breast cancer Maternal Grandmother    Breast cancer Paternal Grandmother    Colon cancer Maternal Uncle    Colon cancer Other        nephew   Diabetes Other        multiple relatives (both sides)   Stroke Brother        Light stroke   Diabetes Brother     Prior to Admission medications   Medication Sig Start Date End Date Taking? Authorizing Provider  ALPRAZolam  (XANAX ) 0.25 MG tablet TAKE 1 TABLET BY MOUTH ONCE DAILY AS NEEDED FOR ANXIETY 01/26/23   Glendia Shad, MD  Calcium  Carb-Cholecalciferol  (CALCIUM  500/VITAMIN D PO) Take 2,000 Units by mouth daily.    [provider]  donepezil (ARICEPT) 5 MG tablet Take 5 mg by mouth at bedtime. 06/18/24 07/18/24  [provider]  feeding supplement (ENSURE PLUS HIGH PROTEIN) LIQD Take 237 mLs by mouth 2 (two) times daily between meals. 07/13/24   Patel, Sona, MD  potassium chloride  (KLOR-CON ) 10 MEQ tablet Take 1 tablet (10 mEq total) by mouth daily. 07/13/24  Tobie Calix, MD  rivaroxaban  (XARELTO ) 20 MG TABS tablet TAKE 1 TABLET BY MOUTH ONCE DAILY WITH SUPPER 05/30/24   Gollan, Timothy J, MD  rosuvastatin  (CRESTOR ) 10 MG tablet Take 1 tablet by mouth once daily 07/12/24   Glendia Shad, MD    Physical Exam: Vitals:   07/21/24 0310 07/21/24 0350 07/21/24 0400 07/21/24 0407  BP:   94/60   Pulse: 85 74 62 64  Resp: 19 19 18 15   Temp:      TempSrc:      SpO2: 100% 98% 97% 99%  Weight:      Height:       Physical Exam Vitals and nursing note reviewed.  Constitutional:      General: She is not in acute distress. HENT:     Head: Normocephalic and atraumatic.  Cardiovascular:     Rate and Rhythm: Tachycardia present.  Rhythm irregular.     Heart sounds: Normal heart sounds.  Pulmonary:     Effort: Pulmonary effort is normal.     Breath sounds: Normal breath sounds.  Abdominal:     Palpations: Abdomen is soft.     Tenderness: There is no abdominal tenderness.  Neurological:     Mental Status: Mental status is at baseline.     Labs on Admission: I have personally reviewed following labs and imaging studies  CBC: Recent Labs  Lab 07/18/24 1150 07/21/24 0153  WBC 5.4 6.0  NEUTROABS 3.8  --   HGB 14.9 15.0  HCT 44.5 43.1  MCV 93.5 90.9  PLT 280.0 313   Basic Metabolic Panel: Recent Labs  Lab 07/18/24 1150 07/21/24 0153  NA 140 138  K 4.1 3.7  CL 99 102  CO2 31 22  GLUCOSE 123* 118*  BUN 12 11  CREATININE 0.73 0.52  CALCIUM  9.8 8.9   GFR: Estimated Creatinine Clearance: 62.1 mL/min (by C-G formula based on SCr of 0.52 mg/dL). Liver Function Tests: Recent Labs  Lab 07/18/24 1150  AST 56*  ALT 34  ALKPHOS 51  BILITOT 0.8  PROT 6.4  ALBUMIN 3.8   No results for input(s): LIPASE, AMYLASE in the last 168 hours. No results for input(s): AMMONIA in the last 168 hours. Coagulation Profile: No results for input(s): INR, PROTIME in the last 168 hours. Cardiac Enzymes: No results for input(s): CKTOTAL, CKMB, CKMBINDEX, TROPONINI in the last 168 hours. BNP (last 3 results) No results for input(s): PROBNP in the last 8760 hours. HbA1C: Recent Labs    07/18/24 1150  HGBA1C 6.1   CBG: No results for input(s): GLUCAP in the last 168 hours. Lipid Profile: Recent Labs    07/18/24 1150  CHOL 182  HDL 56.10  LDLCALC 100*  TRIG 128.0  CHOLHDL 3   Thyroid  Function Tests: Recent Labs    07/18/24 1150  TSH 0.88  FREET4 1.05   Anemia Panel: No results for input(s): VITAMINB12, FOLATE, FERRITIN, TIBC, IRON, RETICCTPCT in the last 72 hours. Urine analysis:    Component Value Date/Time   COLORURINE YELLOW (A) 07/12/2024 0702    APPEARANCEUR CLEAR (A) 07/12/2024 0702   LABSPEC 1.010 07/12/2024 0702   LABSPEC >=1.030 01/04/2014 0850   PHURINE 6.0 07/12/2024 0702   GLUCOSEU NEGATIVE 07/12/2024 0702   GLUCOSEU NEGATIVE 02/25/2021 0811   HGBUR NEGATIVE 07/12/2024 0702   BILIRUBINUR NEGATIVE 07/12/2024 0702   KETONESUR 5 (A) 07/12/2024 0702   PROTEINUR NEGATIVE 07/12/2024 0702   UROBILINOGEN 0.2 02/25/2021 0811   NITRITE NEGATIVE 07/12/2024 9297  LEUKOCYTESUR NEGATIVE 07/12/2024 9297    Radiological Exams on Admission: DG Chest Port 1 View Result Date: 07/21/2024 EXAM: 1 VIEW(S) XRAY OF THE CHEST 07/21/2024 02:25:22 AM COMPARISON: October 05 2021 . CLINICAL HISTORY: Atrial fibrillation with RVR (HCC) [id number]. PER ER NOTE; Pt in via POV, complaints of intermittent heart palpitations x 1 day. Per daughter, her HR has been high and BP low all day, states she has a log of it. Reports hx of Afibb; denies any chest pain. A/Ox4, NAD noted at this time. EKG shows Afibb RVR w/ hypotension. CHANGE TO PORTABLE PER DR FLOY FINDINGS: LUNGS AND PLEURA: No focal pulmonary opacity. No pulmonary edema. No pleural effusion. No pneumothorax. HEART AND MEDIASTINUM: No acute abnormality of the cardiac and mediastinal silhouettes. BONES AND SOFT TISSUES: No acute osseous abnormality. IMPRESSION: 1. No acute abnormalities. Electronically signed by: Norman Gatlin MD 07/21/2024 02:35 AM EDT RP Workstation: [id number]   Data Reviewed for HPI: Relevant notes from primary care and specialist visits, past discharge summaries as available in EHR, including Care Everywhere. Prior diagnostic testing as pertinent to current admission diagnoses Updated medications and problem lists for reconciliation ED course, including vitals, labs, imaging, treatment and response to treatment Triage notes, nursing and pharmacy notes and ED provider's notes Notable results as noted above in HPI      Assessment and Plan: * Atrial fibrillation with rapid  ventricular response (HCC) Continue diltiazem  infusion and wean to oral Dilt Can consider cardiology consult Continue Xarelto   Hypotension Received a 2 L bolus in the ED Will continue gentle fluids   Hyperlipidemia -- continue statins   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 Observation status is judged to be reasonable and necessary in order to provide the required intensity of service to ensure the patient's safety. The patient's presenting symptoms, physical exam findings, and initial radiographic and laboratory data in the context of their medical condition is felt to place them at decreased risk for further clinical deterioration. Furthermore, it is anticipated that the patient will be medically stable for discharge from the hospital within 2 midnights of admission.   Author: Delayne LULLA Solian, MD 07/21/2024 5:10 AM  For on call review [email or web address].

## 2024-07-21 NOTE — ED Notes (Signed)
 Advised nurse that patient has ready bed

## 2024-07-21 NOTE — Progress Notes (Deleted)
 Cardiology Office Note  Date:  07/21/2024   ID:  Allison [name], DOB September 02, 1951, MRN [id number]  PCP:  Allison Shad, MD   No chief complaint on file.   HPI:  [name] is a 73 year old woman with past medical history of paroxysmal atrial fibrillation several episodes 01/2019 Essential hypertension Seen in the emergency room January 31, 2019 for Atrial fibrillation Ejection fraction normal greater than 55% in July 2020 Who presents for follow-up of her paroxysmal atrial fibrillation  Last seen by myself in clinic 6/24 Seen in the emergency room yesterday for palpitations Blood pressure low 80/60 heart rate 140   In follow-up today she reports feeling relatively well on her current medication regiment Taking long-acting diltiazem  120 daily, metoprolol  succinate 75 daily with Xarelto  20 daily  No significant bruising or bleeding Hemoglobin stable 14.1  On the above regiment, does not feel she is having much atrial fibrillation No orthostasis symptoms Takes potassium daily, potassium level stable on recent lab work  Echocardiogram done July 2020, normal LV function, RV function, discussed in detail  EKG personally reviewed by myself on todays visit       Other past medical history reviewed CT of ABD 2017, Minimal descending aorta atherosclerosis, was described as moderate but there is minimal plaque  Family history Mother with a CVA, age 8 Brother with TIA/CVA Strong family hx of atrial fib  Atrial fib 01/31/2019, 11 Am developed tachycardia Went to the emergency room, EKG showing atrial fibrillation 190 bpm  given diltiazem  IV bolus repeat dosing, improved rate down to 130 bpm Discharged on Cardizem  CD 120 mg daily  potassium was low at 3.2, on HCTZ  PMH:   has a past medical history of A-fib (HCC), GERD (gastroesophageal reflux disease), Hypercholesterolemia, Hypertension, Migraine headache, and Psoriasis.  PSH:    Past Surgical History:   Procedure Laterality Date   COLONOSCOPY WITH PROPOFOL  N/A 12/23/2016   Procedure: COLONOSCOPY WITH PROPOFOL ;  Surgeon: Gladis RAYMOND Mariner, MD;  Location: [facility] ENDOSCOPY;  Service: Endoscopy;  Laterality: N/A;   DILATION AND CURETTAGE OF UTERUS     history of abnormal bleeding   TUBAL LIGATION      No current facility-administered medications for this visit.   Current Outpatient Medications  Medication Sig Dispense Refill   ALPRAZolam  (XANAX ) 0.25 MG tablet TAKE 1 TABLET BY MOUTH ONCE DAILY AS NEEDED FOR ANXIETY 30 tablet 0   Calcium  Carb-Cholecalciferol  (CALCIUM  500/VITAMIN D PO) Take 2,000 Units by mouth daily.     donepezil (ARICEPT) 5 MG tablet Take 5 mg by mouth at bedtime.     feeding supplement (ENSURE PLUS HIGH PROTEIN) LIQD Take 237 mLs by mouth 2 (two) times daily between meals. 237 mL 0   potassium chloride  (KLOR-CON ) 10 MEQ tablet Take 1 tablet (10 mEq total) by mouth daily. 90 tablet 1   rivaroxaban  (XARELTO ) 20 MG TABS tablet TAKE 1 TABLET BY MOUTH ONCE DAILY WITH SUPPER 90 tablet 1   rosuvastatin  (CRESTOR ) 10 MG tablet Take 1 tablet by mouth once daily 90 tablet 0   Facility-Administered Medications Ordered in Other Visits  Medication Dose Route Frequency Provider Last Rate Last Admin   0.9 %  sodium chloride  infusion   Intravenous Continuous Cleatus Delayne GAILS, MD 100 mL/hr at 07/21/24 0614 New Bag at 07/21/24 9385   ALPRAZolam  (XANAX ) tablet 0.25 mg  0.25 mg Oral Daily PRN Duncan, Hazel V, MD       diltiazem  (CARDIZEM ) 125 mg in dextrose 5% 125 mL (  1 mg/mL) infusion  5-15 mg/hr Intravenous Continuous Floy Roberts, MD 10 mL/hr at 07/21/24 0616 10 mg/hr at 07/21/24 0616   donepezil (ARICEPT) tablet 5 mg  5 mg Oral QHS Duncan, Hazel V, MD       ondansetron (ZOFRAN) tablet 4 mg  4 mg Oral Q6H PRN Duncan, Hazel V, MD       Or   ondansetron (ZOFRAN) injection 4 mg  4 mg Intravenous Q6H PRN Duncan, Hazel V, MD       rivaroxaban  (XARELTO ) tablet 20 mg  20 mg Oral Q supper Cleatus Delayne GAILS, MD       rosuvastatin  (CRESTOR ) tablet 10 mg  10 mg Oral Daily Duncan, Hazel V, MD        Allergies:   Iodine   Social History:  The patient  reports that she has never smoked. She has never used smokeless tobacco. She reports that she does not drink alcohol and does not use drugs.   Family History:   family history includes Asthma in her father; Breast cancer in her maternal grandmother and paternal grandmother; CVA in her mother; Colon cancer in her maternal uncle and another family member; Diabetes in her brother, mother, and another family member; Psoriasis in her mother; Stroke in her brother.    Review of Systems: Review of Systems  Constitutional: Negative.   HENT: Negative.    Respiratory: Negative.    Cardiovascular: Negative.   Gastrointestinal: Negative.   Musculoskeletal: Negative.   Neurological: Negative.   Psychiatric/Behavioral: Negative.    All other systems reviewed and are negative.   PHYSICAL EXAM: VS:  There were no vitals taken for this visit. , BMI There is no height or weight on file to calculate BMI.  Constitutional:  oriented to person, place, and time. No distress.  HENT:  Head: Grossly normal Eyes:  no discharge. No scleral icterus.  Neck: No JVD, no carotid bruits  Cardiovascular: Regular rate and rhythm, no murmurs appreciated Pulmonary/Chest: Clear to auscultation bilaterally, no wheezes or rails Abdominal: Soft.  no distension.  no tenderness.  Musculoskeletal: Normal range of motion Neurological:  normal muscle tone. Coordination normal. No atrophy Skin: Skin warm and dry Psychiatric: normal affect, pleasant   Recent Labs: 07/13/2024: Magnesium  2.0 07/18/2024: ALT 34; TSH 0.88 07/21/2024: BUN 11; Creatinine, Ser 0.52; Hemoglobin 15.0; Platelets 313; Potassium 3.7; Sodium 138    Lipid Panel Lab Results  Component Value Date   CHOL 182 07/18/2024   HDL 56.10 07/18/2024   LDLCALC 100 (H) 07/18/2024   TRIG 128.0 07/18/2024     Wt Readings from Last 3 Encounters:  07/21/24 134 lb 7.7 oz (61 kg)  07/18/24 134 lb 9.6 oz (61.1 kg)  07/11/24 150 lb (68 kg)     ASSESSMENT AND PLAN:  Problem List Items Addressed This Visit   None  Paroxysmal atrial fibrillation (HCC) -  Recommend she continue diltiazem  ER 120 daily with metoprolol  succinate 75 daily CHADS VASC 2, continue Xarelto  20 daily Recommend continued use of diltiazem  30 mg as needed for breakthrough tachypalpitations concerning for atrial fibrillation For frequent episodes of atrial fibrillation in the future, could consider adding antiarrhythmic such as flecainide Blood pressure low, unable to titrate up on the diltiazem  or metoprolol    Essential hypertension Blood pressure is well controlled on today's visit. No changes made to the medications.   Hypercholesterolemia Cholesterol is at goal on the current lipid regimen. No changes to the medications were made.  Chest pain No recent  chest pain symptoms Risk factors well controlled No further testing needed at this time  Aortic atherosclerosis  on Crestor  10 mg daily, Mild disease noted Cholesterol slightly above goal   Total encounter time more than 40 minutes  Greater than 50% was spent in counseling and coordination of care with the patient    Signed, Velinda Lunger, M.D., Ph.D. [facility] [place], [place] [phone]

## 2024-07-21 NOTE — Consult Note (Signed)
 Cardiology Consultation   Patient ID: Allison [name] MRN: [id number]; DOB: Sep 26, 1951  Admit date: 07/21/2024 Date of Consult: 07/21/2024  PCP:  Glendia Shad, MD   [facility] Providers Cardiologist:  Evalene Lunger, MD        Patient Profile: Allison [name] is a 73 y.o. female with a hx of atrial fibrillation who is being seen 07/21/2024 for the evaluation of A-fib RVR at the request of Dr. Dorinda.  History of Present Illness: Ms. Hirth hypertension, paroxysmal atrial fibrillation, dementia presenting for palpitations.  History taking limited by condition of patient,.   Patient presented to the ED due to palpitations, low BP over the past 24 hours.  She was recently admitted to the hospital with anion gap metabolic acidosis, acute diarrhea discharged on 07/13/2024.  Was previously on metoprolol  and Cardizem  which was held on discharge.  Patient noted to be tachycardic upon arrival, EKG showed atrial fibrillation heart rate 161, systolic BP in the 80s.  Patient started on Cardizem  drip with improvement in heart rates, spontaneously converted to sinus rhythm while in the ED with heart rates in the 80s.   Past Medical History:  Diagnosis Date   A-fib [facility])    GERD (gastroesophageal reflux disease)    Hypercholesterolemia    Hypertension    Migraine headache    Psoriasis     Past Surgical History:  Procedure Laterality Date   COLONOSCOPY WITH PROPOFOL  N/A 12/23/2016   Procedure: COLONOSCOPY WITH PROPOFOL ;  Surgeon: Gladis RAYMOND Mariner, MD;  Location: [facility] ENDOSCOPY;  Service: Endoscopy;  Laterality: N/A;   DILATION AND CURETTAGE OF UTERUS     history of abnormal bleeding   TUBAL LIGATION       Home Medications:  Prior to Admission medications   Medication Sig Start Date End Date Taking? Authorizing Provider  ALPRAZolam  (XANAX ) 0.25 MG tablet TAKE 1 TABLET BY MOUTH ONCE DAILY AS NEEDED FOR ANXIETY 01/26/23  Yes Glendia Shad, MD  Calcium   Carb-Cholecalciferol  (CALCIUM  500/VITAMIN D PO) Take 2,000 Units by mouth daily.   Yes [provider]  donepezil (ARICEPT) 5 MG tablet Take 5 mg by mouth at bedtime. 06/18/24 07/21/24 Yes [provider]  potassium chloride  (KLOR-CON ) 10 MEQ tablet Take 1 tablet (10 mEq total) by mouth daily. 07/13/24  Yes Tobie Calix, MD  rivaroxaban  (XARELTO ) 20 MG TABS tablet TAKE 1 TABLET BY MOUTH ONCE DAILY WITH SUPPER 05/30/24  Yes Gollan, Timothy J, MD  rosuvastatin  (CRESTOR ) 10 MG tablet Take 1 tablet by mouth once daily 07/12/24  Yes Glendia Shad, MD  feeding supplement (ENSURE PLUS HIGH PROTEIN) LIQD Take 237 mLs by mouth 2 (two) times daily between meals. 07/13/24   Patel, Sona, MD    Scheduled Meds:  diltiazem   30 mg Oral QID   donepezil  5 mg Oral QHS   rivaroxaban   20 mg Oral Q supper   rosuvastatin   10 mg Oral Daily   Continuous Infusions:  PRN Meds: ALPRAZolam , ondansetron **OR** ondansetron (ZOFRAN) IV  Allergies:    Allergies  Allergen Reactions   Iodine     Social History:   Social History   Socioeconomic History   Marital status: Single    Spouse name: Not on file   Number of children: 1   Years of education: Not on file   Highest education level: Not on file  Occupational History   Not on file  Tobacco Use   Smoking status: Never   Smokeless tobacco: Never  Vaping Use  Vaping status: Never Used  Substance and Sexual Activity   Alcohol use: No    Alcohol/week: 0.0 standard drinks of alcohol   Drug use: No   Sexual activity: Not on file  Other Topics Concern   Not on file  Social History Narrative   Not on file   Social Drivers of Health   Financial Resource Strain: Low Risk  (02/20/2023)   Overall Financial Resource Strain (CARDIA)    Difficulty of Paying Living Expenses: Not hard at all  Food Insecurity: No Food Insecurity (07/12/2024)   Hunger Vital Sign    Worried About Running Out of Food in the Last Year: Never true    Ran Out of Food  in the Last Year: Never true  Transportation Needs: No Transportation Needs (07/12/2024)   PRAPARE - Administrator, Civil Service (Medical): No    Lack of Transportation (Non-Medical): No  Physical Activity: Sufficiently Active (02/20/2023)   Exercise Vital Sign    Days of Exercise per Week: 5 days    Minutes of Exercise per Session: 30 min  Stress: No Stress Concern Present (02/20/2023)   [employer] of Occupational Health - Occupational Stress Questionnaire    Feeling of Stress : Not at all  Social Connections: Socially Isolated (07/12/2024)   Social Connection and Isolation Panel    Frequency of Communication with Friends and Family: Three times a week    Frequency of Social Gatherings with Friends and Family: Once a week    Attends Religious Services: Never    Database administrator or Organizations: No    Attends Banker Meetings: Never    Marital Status: Divorced  Catering manager Violence: Not At Risk (07/12/2024)   Humiliation, Afraid, Rape, and Kick questionnaire    Fear of Current or Ex-Partner: No    Emotionally Abused: No    Physically Abused: No    Sexually Abused: No    Family History:    Family History  Problem Relation Age of Onset   Asthma Father    Diabetes Mother    CVA Mother    Psoriasis Mother    Breast cancer Maternal Grandmother    Breast cancer Paternal Grandmother    Colon cancer Maternal Uncle    Colon cancer Other        nephew   Diabetes Other        multiple relatives (both sides)   Stroke Brother        Light stroke   Diabetes Brother      ROS:  Please see the history of present illness.   All other ROS reviewed and negative.     Physical Exam/Data: Vitals:   07/21/24 1342 07/21/24 1346 07/21/24 1453 07/21/24 1532  BP:  92/66 90/62 106/63  Pulse: (!) 101  78 89  Resp: 16  14 17   Temp:      TempSrc:      SpO2: 100%  100% 100%  Weight:      Height:        Intake/Output Summary (Last 24 hours) at  07/21/2024 1616 Last data filed at 07/21/2024 0900 Gross per 24 hour  Intake 1000 ml  Output 100 ml  Net 900 ml      07/21/2024    1:46 AM 07/18/2024   11:04 AM 07/11/2024    2:59 PM  Last 3 Weights  Weight (lbs) 134 lb 7.7 oz 134 lb 9.6 oz 150 lb  Weight (kg) 61 kg 61.054  kg 68.04 kg     Body mass index is 20.45 kg/m.  General:  Well nourished, well developed, in no acute distress HEENT: normal Neck: no JVD Vascular: No carotid bruits; Distal pulses 2+ bilaterally Cardiac:  normal S1, S2; RRR; no murmur  Lungs:  clear to auscultation bilaterally, no wheezing, rhonchi or rales  Abd: soft, nontender, no hepatomegaly  Ext: no edema Musculoskeletal:  No deformities, BUE and BLE strength normal and equal Skin: warm and dry  Neuro:  CNs 2-12 intact, no focal abnormalities noted Psych:  Normal affect   EKG:  The EKG was personally reviewed and demonstrates: Atrial fibrillation, heart rate 161 Telemetry:  Telemetry was personally reviewed and demonstrates: Sinus rhythm, heart rate 87  Relevant CV Studies: TTE 07/21/24 1. Left ventricular ejection fraction, by estimation, is 60 to 65%. Left  ventricular ejection fraction by 2D MOD biplane is 64.2 %. Left  ventricular ejection fraction by PLAX is 69 %. The left ventricle has  normal function. Left ventricular endocardial  border not optimally defined to evaluate regional wall motion. Left  ventricular diastolic parameters are consistent with Grade I diastolic  dysfunction (impaired relaxation).   2. Right ventricular systolic function is normal. The right ventricular  size is normal.   3. The mitral valve is normal in structure. Mild mitral valve  regurgitation.   4. The aortic valve is tricuspid. Aortic valve regurgitation is not  visualized.   5. The inferior vena cava is normal in size with greater than 50%  respiratory variability, suggesting right atrial pressure of 3 mmHg.   Laboratory Data: High Sensitivity Troponin:    Recent Labs  Lab 07/21/24 0153 07/21/24 0332  TROPONINIHS 33* 27*     Chemistry Recent Labs  Lab 07/18/24 1150 07/21/24 0153  NA 140 138  K 4.1 3.7  CL 99 102  CO2 31 22  GLUCOSE 123* 118*  BUN 12 11  CREATININE 0.73 0.52  CALCIUM  9.8 8.9  GFRNONAA  --  >60  ANIONGAP  --  14    Recent Labs  Lab 07/18/24 1150  PROT 6.4  ALBUMIN 3.8  AST 56*  ALT 34  ALKPHOS 51  BILITOT 0.8   Lipids  Recent Labs  Lab 07/18/24 1150  CHOL 182  TRIG 128.0  HDL 56.10  LDLCALC 100*  CHOLHDL 3    Hematology Recent Labs  Lab 07/18/24 1150 07/21/24 0153  WBC 5.4 6.0  RBC 4.76 4.74  HGB 14.9 15.0  HCT 44.5 43.1  MCV 93.5 90.9  MCH  --  31.6  MCHC 33.4 34.8  RDW 15.6* 14.9  PLT 280.0 313   Thyroid   Recent Labs  Lab 07/18/24 1150  TSH 0.88  FREET4 1.05    BNPNo results for input(s): BNP, PROBNP in the last 168 hours.  DDimer No results for input(s): DDIMER in the last 168 hours.  Radiology/Studies:  DG Chest Port 1 View Result Date: 07/21/2024 EXAM: 1 VIEW(S) XRAY OF THE CHEST 07/21/2024 02:25:22 AM COMPARISON: October 05 2021 . CLINICAL HISTORY: Atrial fibrillation with RVR (HCC) [id number]. PER ER NOTE; Pt in via POV, complaints of intermittent heart palpitations x 1 day. Per daughter, her HR has been high and BP low all day, states she has a log of it. Reports hx of Afibb; denies any chest pain. A/Ox4, NAD noted at this time. EKG shows Afibb RVR w/ hypotension. CHANGE TO PORTABLE PER DR FLOY FINDINGS: LUNGS AND PLEURA: No focal pulmonary opacity. No pulmonary edema. No pleural  effusion. No pneumothorax. HEART AND MEDIASTINUM: No acute abnormality of the cardiac and mediastinal silhouettes. BONES AND SOFT TISSUES: No acute osseous abnormality. IMPRESSION: 1. No acute abnormalities. Electronically signed by: Norman Gatlin MD 07/21/2024 02:35 AM EDT RP Workstation: [id number]     Assessment and Plan: A-fib RVR -S/p Cardizem  drip with conversion to sinus  rhythm. -Start p.o. Cardizem  30 mg every 6 hours.  Titrate off Cardizem  drip. -Continue Xarelto  20 mg daily. -Monitor patient overnight, consolidate Cardizem  to 120 mg daily if patient maintains sinus rhythm. -Echo with normal EF. -Anticipate discharge tomorrow a.m. if no recurrence of atrial fibrillation. -Consider EP input as outpatient  Signed, Redell Cave, MD  07/21/2024 4:16 PM

## 2024-07-21 NOTE — ED Provider Notes (Signed)
 [facility] Provider Note    Event Date/Time   First MD Initiated Contact with Patient 07/21/24 0210     (approximate)   History   Palpitations   HPI  Allison [name] is a 73 y.o. female who presents to the emergency department today because of concerns for fast heart rate and low blood pressure.  Patient has history of atrial fibrillation.  Was recently discharged from the hospital.  Family states that she was told to stop her diltiazem  and metoprolol .  Started feeling a fast heart rate yesterday.  She denies any associated chest pain or shortness of breath.  No recent fevers.   Physical Exam   Triage Vital Signs: ED Triage Vitals  Encounter Vitals Group     BP 07/21/24 0148 (!) 80/62     Girls Systolic BP Percentile --      Girls Diastolic BP Percentile --      Boys Systolic BP Percentile --      Boys Diastolic BP Percentile --      Pulse Rate 07/21/24 0209 (!) 140     Resp 07/21/24 0148 15     Temp 07/21/24 0148 98.2 F (36.8 C)     Temp Source 07/21/24 0148 Oral     SpO2 07/21/24 0148 95 %     Weight 07/21/24 0146 134 lb 7.7 oz (61 kg)     Height 07/21/24 0146 5' 8 (1.727 m)     Head Circumference --      Peak Flow --      Pain Score 07/21/24 0146 0     Pain Loc --      Pain Education --      Exclude from Growth Chart --     Most recent vital signs: Vitals:   07/21/24 0148 07/21/24 0209  BP: (!) 80/62 106/71  Pulse:  (!) 140  Resp: 15 (!) 23  Temp: 98.2 F (36.8 C)   SpO2: 95% 98%   General: Awake, alert. CV:  Good peripheral perfusion. Tachycardia, irregularly irregular rhythm. Resp:  Normal effort. Lungs clear. Abd:  No distention.    ED Results / Procedures / Treatments   Labs (all labs ordered are listed, but only abnormal results are displayed) Labs Reviewed  BASIC METABOLIC PANEL WITH GFR - Abnormal; Notable for the following components:      Result Value   Glucose, Bld 118 (*)    All other components within  normal limits  TROPONIN I (HIGH SENSITIVITY) - Abnormal; Notable for the following components:   Troponin I (High Sensitivity) 33 (*)    All other components within normal limits  TROPONIN I (HIGH SENSITIVITY) - Abnormal; Notable for the following components:   Troponin I (High Sensitivity) 27 (*)    All other components within normal limits  CBC     EKG  I, Guadalupe Eagles, attending physician, personally viewed and interpreted this EKG  EKG Time: 0206 Rate: 151 Rhythm: atrial fibrillation with rvr Axis: left axis deviation Intervals: qtc 495 QRS: narrow, q waves v1 ST changes: no st elevation Impression: abnormal ekg    RADIOLOGY I independently interpreted and visualized the CXR. My interpretation: No pneumonia Radiology interpretation:  IMPRESSION:  1. No acute abnormalities.    PROCEDURES:  Critical Care performed: Yes  CRITICAL CARE Performed by: Guadalupe Eagles   Total critical care time: 30 minutes  Critical care time was exclusive of separately billable procedures and treating other patients.  Critical care was necessary  to treat or prevent imminent or life-threatening deterioration.  Critical care was time spent personally by me on the following activities: development of treatment plan with patient and/or surrogate as well as nursing, discussions with consultants, evaluation of patient's response to treatment, examination of patient, obtaining history from patient or surrogate, ordering and performing treatments and interventions, ordering and review of laboratory studies, ordering and review of radiographic studies, pulse oximetry and re-evaluation of patient's condition.   Procedures    MEDICATIONS ORDERED IN ED: Medications - No data to display   IMPRESSION / MDM / ASSESSMENT AND PLAN / ED COURSE  I reviewed the triage vital signs and the nursing notes.                              Differential diagnosis includes, but is not limited to,  electrolyte abnormality, ACS, arrhythmia  Patient's presentation is most consistent with acute presentation with potential threat to life or bodily function.   The patient is on the cardiac monitor to evaluate for evidence of arrhythmia and/or significant heart rate changes.  Patient presented to the emergency department today because concerns for fast heart rate and low blood pressure.  Patient with history of A-fib.  Family states that she has not been on her rate controlling medications.  Initial heart rate was significantly elevated at 160.  Patient was slightly hypotensive as well.  Because of this a small dose of diltiazem  was given.  This did improve the patient's heart rate taking it down to the 100s.  However shortly thereafter it started elevating again.  Because of this the decision was made to start patient on diltiazem  infusion.  Initial troponin was slightly elevated however this improved on repeat.  I think this is likely due to demand rather than ACS.  Discussed with Dr. Cleatus with hospitalist service to evaluate for admission.      FINAL CLINICAL IMPRESSION(S) / ED DIAGNOSES   Final diagnoses:  Atrial fibrillation with RVR (HCC)       Note:  This document was prepared using Dragon voice recognition software and may include unintentional dictation errors.    Floy Roberts, MD 07/21/24 [phone]

## 2024-07-21 NOTE — Assessment & Plan Note (Addendum)
 Saw endocrinology 05/15/23 - thyroid  mass. Ultrasound thyroid  06/08/23 - 3.6cm solid nodule.  Also left sided 1.0 cm nodule. Recommended FNA of the 3.6 cm mass. Ultrasound guided biopsy 07/05/23. Will need to confirm f/u.

## 2024-07-21 NOTE — Assessment & Plan Note (Signed)
 Follow met b and A1c.

## 2024-07-21 NOTE — Assessment & Plan Note (Signed)
 Continue xarelto .  Is currently off cardizem  CD 120mg  q day and toprol  q day as instructed on discharge given lower blood pressure. At discharge was instructed not to take these medications if blood pressure remained <120 systolic. Has been monitoring blood pressure and has been remaining low 100s. Appears to be in SR today. Ventricular rate in the 90s. Has diltiazem  30mg  prn to take if needed. Due to see cardiology Monday 07/22/24. May need to add back low dose. Continue to follow blood pressure and heart rate. Follow.

## 2024-07-21 NOTE — Assessment & Plan Note (Signed)
 Low potassium during recent hospitalization. Replaced while in hospital. Discharge potassium 3.2. recheck today.

## 2024-07-21 NOTE — Assessment & Plan Note (Signed)
 Continue diltiazem  infusion and wean to oral Dilt Can consider cardiology consult Continue Xarelto 

## 2024-07-21 NOTE — ED Notes (Signed)
 Echo at bedside

## 2024-07-21 NOTE — ED Notes (Signed)
 Family requesting help in rm. Upon entering pt stating she needs help to use the bathroom. Pt able to use bedpan without difficulties.

## 2024-07-21 NOTE — Assessment & Plan Note (Signed)
 Significant amount of weight loss over this past year. Denies any abdominal pain. Recent hospitalization for increased diarrhea. Diarrhea resolved. Discussed further w/up for weight loss. Check labs. Also, refer to GI for further evaluation and question of need for luminal evaluation. Encourage increased po intake. Will place order for home health. See if qualifies for Meals on Wheels.

## 2024-07-21 NOTE — Assessment & Plan Note (Signed)
 On Crestor .  Follow lipid panel and liver function tests.

## 2024-07-21 NOTE — ED Notes (Signed)
 Pt given pillow. Denies any needs at this time.

## 2024-07-21 NOTE — ED Notes (Signed)
 Patient was taken to room 14 via wheelchair. Patient was placed on cardiac monitor. Call light within reach. This tech stayed in the room with the patient until a RN came. Martha,RN at bedside. EKG given to Dr.  Floy.

## 2024-07-21 NOTE — Progress Notes (Signed)
  Progress Note   Patient: Allison [name] FMW:[id number] DOB: 11-08-1950 DOA: 07/21/2024     0 DOS: the patient was seen and examined on 07/21/2024    Patient is seen and examined at bedside this morning Echo have been requested and cardiology is consulted by me Other assessment and plan as listed below  Vitals:   07/21/24 1342 07/21/24 1346 07/21/24 1453 07/21/24 1532  BP:  92/66 90/62 106/63  Pulse: (!) 101  78 89  Resp: 16  14 17   Temp:      TempSrc:      SpO2: 100%  100% 100%  Weight:      Height:         Author: Drue ONEIDA Potter, MD 07/21/2024 3:41 PM  For on call review [email or web address].

## 2024-07-21 NOTE — Assessment & Plan Note (Signed)
 Received a 2 L bolus in the ED Will continue gentle fluids

## 2024-07-21 NOTE — Assessment & Plan Note (Signed)
 Recently admitted as outlined. Diarrhea has resolved now. Was found to have significantly low potassium. Given supplementation in the hospital. Discharge potassium - 3.2. will recheck today.

## 2024-07-21 NOTE — Assessment & Plan Note (Signed)
 Some weakness with recent increased diarrhea and recent hospitalization. Will have home health PT evaluate and treat.

## 2024-07-21 NOTE — Assessment & Plan Note (Signed)
 Saw neurology 02/2024. W/up in progress. Recommended MRI. Request something to help her with MRI. Discuss with neurology.

## 2024-07-21 NOTE — Assessment & Plan Note (Signed)
 Currently off metoprolol  and diltiazem  as outlined. Blood pressure remaining in the low 100s as outlined. Follow pressures and heart rate. May need to add back low dose. Due to f/u with cardiology 07/22/24.

## 2024-07-21 NOTE — Progress Notes (Signed)
 Echocardiogram 2D Echocardiogram has been performed.  Zlaty Alexa N Hargun Spurling,RDCS 07/21/2024, 2:15 PM

## 2024-07-22 ENCOUNTER — Encounter: Payer: Self-pay | Admitting: Internal Medicine

## 2024-07-22 ENCOUNTER — Other Ambulatory Visit: Payer: Self-pay

## 2024-07-22 ENCOUNTER — Telehealth: Payer: Self-pay | Admitting: Cardiovascular Disease

## 2024-07-22 ENCOUNTER — Telehealth: Payer: Self-pay

## 2024-07-22 ENCOUNTER — Ambulatory Visit: Admitting: Cardiovascular Disease

## 2024-07-22 DIAGNOSIS — I480 Paroxysmal atrial fibrillation: Secondary | ICD-10-CM

## 2024-07-22 DIAGNOSIS — I4892 Unspecified atrial flutter: Secondary | ICD-10-CM

## 2024-07-22 DIAGNOSIS — I10 Essential (primary) hypertension: Secondary | ICD-10-CM

## 2024-07-22 DIAGNOSIS — I4891 Unspecified atrial fibrillation: Secondary | ICD-10-CM

## 2024-07-22 DIAGNOSIS — E782 Mixed hyperlipidemia: Secondary | ICD-10-CM

## 2024-07-22 MED ORDER — DILTIAZEM HCL ER COATED BEADS 120 MG PO CP24: 120.0000 mg | ORAL_CAPSULE | Freq: Every day | ORAL | Status: DC

## 2024-07-22 MED ORDER — FLECAINIDE ACETATE 50 MG PO TABS
50.0000 mg | ORAL_TABLET | Freq: Two times a day (BID) | ORAL | Status: DC
  Administered 2024-07-23: 50 mg via ORAL
  Filled 2024-07-22: qty 1

## 2024-07-22 MED ORDER — ENSURE PLUS HIGH PROTEIN PO LIQD
237.0000 mL | Freq: Two times a day (BID) | ORAL | Status: DC
  Administered 2024-07-22 – 2024-07-25 (×5): 237 mL via ORAL

## 2024-07-22 MED ORDER — DILTIAZEM HCL ER COATED BEADS 120 MG PO CP24
120.0000 mg | ORAL_CAPSULE | Freq: Every day | ORAL | 11 refills | Status: DC
Start: 2024-07-22 — End: 2024-07-22
  Filled 2024-07-22: qty 30, 30d supply, fill #0

## 2024-07-22 MED ORDER — FLECAINIDE ACETATE 100 MG PO TABS
100.0000 mg | ORAL_TABLET | Freq: Once | ORAL | Status: AC
  Administered 2024-07-22: 100 mg via ORAL
  Filled 2024-07-22: qty 1

## 2024-07-22 MED ORDER — DILTIAZEM HCL ER COATED BEADS 120 MG PO CP24
120.0000 mg | ORAL_CAPSULE | Freq: Every day | ORAL | 11 refills | Status: DC
  Filled 2024-07-22: qty 30, 30d supply, fill #0

## 2024-07-22 NOTE — Telephone Encounter (Signed)
 Fyi pt is currently in the hospital

## 2024-07-22 NOTE — TOC Transition Note (Signed)
 Transition of Care [facility]) - Discharge Note   Patient Details  Name: Allison [name] MRN: [id number] Date of Birth: Dec 01, 1950  [facility] CM/SW Contact:  Marinda Cooks, RN Phone Number: 07/22/2024, 2:34 PM   Clinical Narrative:    This CM updated by covering MD pt medically cleared to dc today and has active DC order . This CM spoke with liaison rhonda and HH arranged with tentative SOC within 48 hrs. DC transportation confirmed for pt with daughter Daphne. Medical team updated . No additional DC needs requested by medical team or identified by CM at this time .     Final next level of care: Home w Home Health Services Barriers to Discharge: No Barriers Identified   Patient Goals and CMS Choice     Choice offered to / list presented to : Adult Children     Name of family member notified: Daphne (Daughter) Patient and family notified of of transfer: 07/22/24  Discharge Plan and Services Additional resources added to the After Visit Summary for    [facility] agency contact info                        HH Arranged: PT [facility] Agency: [facility] Date [facility] Agency Contacted: 07/22/24 Time HH Agency Contacted: [phone]    Social Drivers of Health (SDOH) Interventions SDOH Screenings   Food Insecurity: No Food Insecurity (07/21/2024)  Housing: Low Risk  (07/21/2024)  Transportation Needs: No Transportation Needs (07/21/2024)  Utilities: Not At Risk (07/21/2024)  Depression (PHQ2-9): Low Risk  (02/20/2023)  Financial Resource Strain: Low Risk  (02/20/2023)  Physical Activity: Sufficiently Active (02/20/2023)  Social Connections: Socially Isolated (07/12/2024)  Stress: No Stress Concern Present (02/20/2023)  Tobacco Use: Low Risk  (07/21/2024)     Readmission Risk Interventions     No data to display

## 2024-07-22 NOTE — Progress Notes (Signed)
 Dr. Dorinda called this RN to make me aware that administration parameters were updated on Cardizem  and that Dr. Darron wants to see patient before she is discharged. Updated patient and her daughter.

## 2024-07-22 NOTE — Evaluation (Signed)
 Physical Therapy Evaluation Patient Details Name: Allison [name] MRN: [id number] DOB: 09-05-1951 Today's Date: 07/22/2024  History of Present Illness  presented to ER secondary to heart palpitations, hypotension; admitted for management of Afib with RVR  Clinical Impression  Patient resting in bed upon arrival to room; alert and oriented to basic information, follows simple commands, eager for progressive mobility attempts.  Mild STM deficits noted, with patient retelling similar questions/stories multiple times throughout session.  Denies pain.  Bilat UE/LE strength and ROM grossly symmetrical and WFL; no focal weakness appreciated.  Able to complete bed mobility with mod indep; sit/stand, basic transfers and gait (200') without assist device, cga/close sup. Demonstrates narrowed BOS, mildly antalgic (R LE); slight sway/deviation with head turns and dynamic gait components (often correcting with LE step strategy). HR to 143 with gait efforts (patient mildly symptomatic), recovering to 110s within 30-60 seconds of seated rest.  RN/MD informed/aware. Would benefit from skilled PT to address above deficits and promote optimal return to PLOF.; recommend post-acute PT follow up as indicated by interdisciplinary care team.            If plan is discharge home, recommend the following: A little help with walking and/or transfers;A little help with bathing/dressing/bathroom   Can travel by private vehicle        Equipment Recommendations    Recommendations for Other Services       Functional Status Assessment Patient has had a recent decline in their functional status and demonstrates the ability to make significant improvements in function in a reasonable and predictable amount of time.     Precautions / Restrictions Precautions Precautions: None Restrictions Weight Bearing Restrictions Per Provider Order: No      Mobility  Bed Mobility Overal bed mobility: Modified Independent                   Transfers Overall transfer level: Needs assistance Equipment used: None Transfers: Sit to/from Stand Sit to Stand: Min assist, Contact guard assist                Ambulation/Gait Ambulation/Gait assistance: Contact guard assist Gait Distance (Feet): 200 Feet Assistive device: None         General Gait Details: narrowed BOS, mildly antalgic (R LE); slight sway/deviation with head turns and dynamic gait components (often correcting with LE step strategy).  HR to 143 with gait efforts (patient mildly symptomatic), recovering to 110s within 30-60 seconds of seated rest  Stairs            Wheelchair Mobility     Tilt Bed    Modified Rankin (Stroke Patients Only)       Balance Overall balance assessment: Needs assistance Sitting-balance support: No upper extremity supported, Feet supported Sitting balance-Leahy Scale: Good     Standing balance support: No upper extremity supported Standing balance-Leahy Scale: Fair                               Pertinent Vitals/Pain Pain Assessment Pain Assessment: No/denies pain    Home Living Family/patient expects to be discharged to:: Private residence Living Arrangements: Alone Available Help at Discharge: Family;Available 24 hours/day Type of Home: House Home Access: Stairs to enter   [employer] of Steps: 2-3   Home Layout: One level Home Equipment: Rollator (4 wheels)      Prior Function Prior Level of Function : Independent/Modified Independent  Mobility Comments: At baseline, indep without assist device; recently using 4WRW (since previous hospitalization).  Denies fall history.   + driving.       Extremity/Trunk Assessment   Upper Extremity Assessment Upper Extremity Assessment: Overall WFL for tasks assessed    Lower Extremity Assessment Lower Extremity Assessment: Overall WFL for tasks assessed (grossly at least 4/5 throughout; no  focal weakness)       Communication   Communication Communication: No apparent difficulties    Cognition Arousal: Alert Behavior During Therapy: WFL for tasks assessed/performed   PT - Cognitive impairments: History of cognitive impairments                       PT - Cognition Comments: Alert and oriented to basic information, follows commands, pleasant and cooperative.  Notable STM deficits, often repeating similar stories/questions multiple times throughout session Following commands: Intact       Cueing Cueing Techniques: Verbal cues     General Comments      Exercises Other Exercises Other Exercises: Toilet transfer, ambulatory without assist device, cga/close sup Other Exercises: Upper and lower body dressing, set up/supervision; sit/stand and standing balance with  clothing management, cga/close sup   Assessment/Plan    PT Assessment Patient needs continued PT services  PT Problem List Decreased strength;Decreased activity tolerance;Decreased balance;Decreased mobility;Decreased cognition;Decreased knowledge of use of DME;Decreased safety awareness;Decreased knowledge of precautions;Cardiopulmonary status limiting activity       PT Treatment Interventions DME instruction;Gait training;Stair training;Functional mobility training;Therapeutic activities;Therapeutic exercise;Balance training;Cognitive remediation;Patient/family education    PT Goals (Current goals can be found in the Care Plan section)  Acute Rehab PT Goals Patient Stated Goal: to return home PT Goal Formulation: With patient Time For Goal Achievement: 08/05/24 Potential to Achieve Goals: Good    Frequency Min 2X/week     Co-evaluation               AM-PAC PT 6 Clicks Mobility  Outcome Measure Help needed turning from your back to your side while in a flat bed without using bedrails?: None Help needed moving from lying on your back to sitting on the side of a flat bed without  using bedrails?: None Help needed moving to and from a bed to a chair (including a wheelchair)?: A Little Help needed standing up from a chair using your arms (e.g., wheelchair or bedside chair)?: A Little Help needed to walk in hospital room?: A Little Help needed climbing 3-5 steps with a railing? : A Little 6 Click Score: 20    End of Session   Activity Tolerance: Patient tolerated treatment well Patient left: in bed;with call bell/phone within reach;with family/visitor present Nurse Communication: Mobility status PT Visit Diagnosis: Muscle weakness (generalized) (M62.81);Difficulty in walking, not elsewhere classified (R26.2)    Time: 1329-1400 PT Time Calculation (min) (ACUTE ONLY): 31 min   Charges:   PT Evaluation $PT Eval Moderate Complexity: 1 Mod PT Treatments $Therapeutic Activity: 8-22 mins PT General Charges $$ ACUTE PT VISIT: 1 Visit         Thaer Miyoshi H. Delores, PT, DPT, NCS 07/22/24, 2:41 PM [phone]

## 2024-07-22 NOTE — Progress Notes (Signed)
 Discharge instructions given to and reviewed with patient and her daughter. Both verbalized understanding of all discharge instructions including follow up care, home medication list and administration instructions for Cardizem  including hold parameters. Waiting for Dr. Arida to come see patient before she leaves.

## 2024-07-22 NOTE — Progress Notes (Signed)
 Sent secure chat to Dr. Dorinda at 1440 that patient's heart rate got as high as 145 while ambulating with PT but recovered to 100-110 NSR/Sinus tach. Blood pressure checked and is 96/52(65) and Cardizem  due. Also daughter waiting to speak with MD. Paged MD at 1500 and Dr. Dorinda responded at [phone] via secure chat and stated okay to discharge patient since heart rate is controlled. MD gave order to hold Cardizem  dose today and that he will place instructions for holding parameters for Cardizem  at home.

## 2024-07-22 NOTE — Progress Notes (Signed)
 While waiting for Dr. Darron to arrive patient called out complaining of heart palpitations. Vital signs obtained and HR 120s. Placed patient back on tele monitor and heart rate 1 teens -130 afib. Sent secure chat to Dr. Dorinda and Dr. Darron. Dr. Darron acknowledged and stated he will see her soon at the bedside.

## 2024-07-22 NOTE — Telephone Encounter (Signed)
 Yes. I will follow. Ok to give orders if needed.

## 2024-07-22 NOTE — Discharge Summary (Deleted)
 Physician Discharge Summary   Patient: Allison [name] MRN: [id number] DOB: 14-Feb-1951  Admit date:     07/21/2024  Discharge date: 07/22/24  Discharge Physician: Drue ONEIDA Potter   PCP: Glendia Shad, MD   Recommendations at discharge:  Follow-up with cardiology, patient follows up with Dr. Gollan  Discharge Diagnoses: Atrial fibrillation with rapid ventricular response (HCC) Currently rate controlled Dehydration with hypotension  Hyperlipidemia Mild dementia  Hospital Course:  [name] is a 73 y.o. female with medical history significant for Afibrilllation on Xarelto , HTN, GERD, HL, Dementia, with recent hospitalization (9/18 - 07/13/2024) for acute diarrhea with anion gap metabolic acidosis, being admitted with A-fib with RVR .   She presented to the ED with a 1 day complaint of intermittent heart palpitations and low blood pressure readings at home.  She denies chest pain or shortness of breath. Daughter states she was advised to hold diltiazem  and metoprolol  for SBP under 120 so she has not received it since discharge from the hospital since her blood pressure never got above 117. On arrival to the ED heart rate 164 with BP 80/62, afebrile with O2 sat 95% on room air. Labs notable for troponin of 27.   Cardioversion was offered due to unstable A-fib however family declined She was treated with NS bolus x 2, diltiazem  5 mg IV bolus and subsequently placed on a diltiazem  infusion. Was seen by cardiologist and Cardizem  drip has been weaned off to oral Cardizem .  Patient is doing much better has been seen by PT OT have been cleared for discharge today will follow-up with cardiologist with home health.    Consultants: Cardiology Procedures performed: None Disposition: Home health Diet recommendation:  Cardiac diet DISCHARGE MEDICATION: Allergies as of 07/22/2024       Reactions   Iodine         Medication List     TAKE these medications    ALPRAZolam  0.25 MG  tablet Commonly known as: XANAX  TAKE 1 TABLET BY MOUTH ONCE DAILY AS NEEDED FOR ANXIETY   CALCIUM  500/VITAMIN D PO Take 2,000 Units by mouth daily.   diltiazem  120 MG 24 hr capsule Commonly known as: Cardizem  CD Take 1 capsule (120 mg total) by mouth daily. Hold for blood pressure less than 100/60   donepezil 5 MG tablet Commonly known as: ARICEPT Take 5 mg by mouth at bedtime.   feeding supplement Liqd Take 237 mLs by mouth 2 (two) times daily between meals.   potassium chloride  10 MEQ tablet Commonly known as: KLOR-CON  Take 1 tablet (10 mEq total) by mouth daily.   rosuvastatin  10 MG tablet Commonly known as: CRESTOR  Take 1 tablet by mouth once daily   Xarelto  20 MG Tabs tablet Generic drug: rivaroxaban  TAKE 1 TABLET BY MOUTH ONCE DAILY WITH SUPPER        Discharge Exam: Filed Weights   07/21/24 0146  Weight: 61 kg   Vitals and nursing note reviewed.  Constitutional:      General: She is not in acute distress. HENT:     Head: Normocephalic and atraumatic.  Cardiovascular: S1-S2 present Pulmonary:     Effort: Pulmonary effort is normal.     Breath sounds: Normal breath sounds.  Abdominal:     Palpations: Abdomen is soft.     Tenderness: There is no abdominal tenderness.  Neurological:     Mental Status: Mental status is at baseline.     Condition at discharge: good  The results of significant diagnostics from  this hospitalization (including imaging, microbiology, ancillary and laboratory) are listed below for reference.   Imaging Studies: ECHOCARDIOGRAM COMPLETE Result Date: 07/21/2024    ECHOCARDIOGRAM REPORT   Patient Name:   Allison [name] Date of Exam: 07/21/2024 Medical Rec #:  [id number]           Height:       68.0 in Accession #:    [id number]          Weight:       134.5 lb Date of Birth:  08-Nov-1950           BSA:          1.727 m Patient Age:    72 years            BP:           106/72 mmHg Patient Gender: F                   HR:            91 bpm. Exam Location:  [facility] Procedure: 2D Echo, Color Doppler and Cardiac Doppler (Both Spectral and Color            Flow Doppler were utilized during procedure). Indications:     Atrial Fibrillation  History:         Patient has prior history of Echocardiogram examinations, most                  recent 05/03/2019. Arrythmias:Atrial Fibrillation,                  Signs/Symptoms:Palpitations; Risk Factors:Hypertension,                  Dyslipidemia and GERD.  Sonographer:     Logan Shove RDCS Referring Phys:  [id number] DRUE ONEIDA POTTER Diagnosing Phys: Redell Cave MD IMPRESSIONS  1. Left ventricular ejection fraction, by estimation, is 60 to 65%. Left ventricular ejection fraction by 2D MOD biplane is 64.2 %. Left ventricular ejection fraction by PLAX is 69 %. The left ventricle has normal function. Left ventricular endocardial border not optimally defined to evaluate regional wall motion. Left ventricular diastolic parameters are consistent with Grade I diastolic dysfunction (impaired relaxation).  2. Right ventricular systolic function is normal. The right ventricular size is normal.  3. The mitral valve is normal in structure. Mild mitral valve regurgitation.  4. The aortic valve is tricuspid. Aortic valve regurgitation is not visualized.  5. The inferior vena cava is normal in size with greater than 50% respiratory variability, suggesting right atrial pressure of 3 mmHg. FINDINGS  Left Ventricle: Left ventricular ejection fraction, by estimation, is 60 to 65%. Left ventricular ejection fraction by PLAX is 69 %. Left ventricular ejection fraction by 2D MOD biplane is 64.2 %. The left ventricle has normal function. Left ventricular  endocardial border not optimally defined to evaluate regional wall motion. The left ventricular internal cavity size was normal in size. There is no left ventricular hypertrophy. Left ventricular diastolic parameters are consistent with Grade I diastolic dysfunction (impaired  relaxation). Right Ventricle: The right ventricular size is normal. No increase in right ventricular wall thickness. Right ventricular systolic function is normal. Left Atrium: Left atrial size was normal in size. Right Atrium: Right atrial size was normal in size. Pericardium: There is no evidence of pericardial effusion. Mitral Valve: The mitral valve is normal in structure. Mild mitral valve regurgitation. Tricuspid Valve: The tricuspid valve is normal in structure.  Tricuspid valve regurgitation is mild. Aortic Valve: The aortic valve is tricuspid. Aortic valve regurgitation is not visualized. Aortic valve peak gradient measures 3.1 mmHg. Pulmonic Valve: The pulmonic valve was not well visualized. Pulmonic valve regurgitation is trivial. Aorta: The aortic root is normal in size and structure. Venous: The inferior vena cava is normal in size with greater than 50% respiratory variability, suggesting right atrial pressure of 3 mmHg. IAS/Shunts: No atrial level shunt detected by color flow Doppler.  LEFT VENTRICLE PLAX 2D                        Biplane EF (MOD) LV EF:         Left            LV Biplane EF:   Left                ventricular                      ventricular                ejection                         ejection                fraction by                      fraction by                PLAX is 69                       2D MOD                %.                               biplane is LVIDd:         4.14 cm                          64.2 %. LVIDs:         2.56 cm LV PW:         1.14 cm         Diastology LV IVS:        0.88 cm         LV e' medial:    5.51 cm/s LVOT diam:     2.50 cm         LV E/e' medial:  8.7 LVOT Area:     4.91 cm        LV e' lateral:   6.77 cm/s                                LV E/e' lateral: 7.1  LV Volumes (MOD) LV vol d, MOD    65.5 ml A2C: LV vol d, MOD    64.4 ml A4C: LV vol s, MOD    24.5 ml A2C: LV vol s, MOD    22.7 ml A4C: LV SV MOD A2C:   41.0 ml LV SV MOD A4C:   64.4 ml  LV SV MOD BP:    41.6 ml RIGHT VENTRICLE  IVC RV Basal diam:  2.95 cm    IVC diam: 1.51 cm RV S prime:     9.67 cm/s LEFT ATRIUM             Index        RIGHT ATRIUM           Index LA diam:        2.70 cm 1.56 cm/m   RA Area:     15.20 cm LA Vol (A2C):   40.7 ml 23.57 ml/m  RA Volume:   37.40 ml  21.66 ml/m LA Vol (A4C):   27.5 ml 15.93 ml/m LA Biplane Vol: 35.3 ml 20.45 ml/m  AORTIC VALVE AV Area (Vmax): 4.46 cm AV Vmax:        88.60 cm/s AV Peak Grad:   3.1 mmHg LVOT Vmax:      80.50 cm/s  AORTA Ao Root diam: 2.70 cm Ao Asc diam:  2.70 cm MITRAL VALVE MV Area (PHT): 5.09 cm    SHUNTS MV Decel Time: 149 msec    Systemic Diam: 2.50 cm MV E velocity: 48.00 cm/s MV A velocity: 61.10 cm/s MV E/A ratio:  0.79 Redell Cave MD Electronically signed by Redell Cave MD Signature Date/Time: 07/21/2024/4:31:38 PM    Final    DG Chest Port 1 View Result Date: 07/21/2024 EXAM: 1 VIEW(S) XRAY OF THE CHEST 07/21/2024 02:25:22 AM COMPARISON: October 05 2021 . CLINICAL HISTORY: Atrial fibrillation with RVR (HCC) [id number]. PER ER NOTE; Pt in via POV, complaints of intermittent heart palpitations x 1 day. Per daughter, her HR has been high and BP low all day, states she has a log of it. Reports hx of Afibb; denies any chest pain. A/Ox4, NAD noted at this time. EKG shows Afibb RVR w/ hypotension. CHANGE TO PORTABLE PER DR FLOY FINDINGS: LUNGS AND PLEURA: No focal pulmonary opacity. No pulmonary edema. No pleural effusion. No pneumothorax. HEART AND MEDIASTINUM: No acute abnormality of the cardiac and mediastinal silhouettes. BONES AND SOFT TISSUES: No acute osseous abnormality. IMPRESSION: 1. No acute abnormalities. Electronically signed by: Norman Gatlin MD 07/21/2024 02:35 AM EDT RP Workstation: [id number]    Microbiology: Results for orders placed or performed during the hospital encounter of 07/11/24  Gastrointestinal Panel by PCR , Stool     Status: None   Collection Time: 07/12/24 10:36  AM   Specimen: Stool  Result Value Ref Range Status   Campylobacter species NOT DETECTED NOT DETECTED Final   Plesimonas shigelloides NOT DETECTED NOT DETECTED Final   Salmonella species NOT DETECTED NOT DETECTED Final   Yersinia enterocolitica NOT DETECTED NOT DETECTED Final   Vibrio species NOT DETECTED NOT DETECTED Final   Vibrio cholerae NOT DETECTED NOT DETECTED Final   Enteroaggregative E coli (EAEC) NOT DETECTED NOT DETECTED Final   Enteropathogenic E coli (EPEC) NOT DETECTED NOT DETECTED Final   Enterotoxigenic E coli (ETEC) NOT DETECTED NOT DETECTED Final   Shiga like toxin producing E coli (STEC) NOT DETECTED NOT DETECTED Final   Shigella/Enteroinvasive E coli (EIEC) NOT DETECTED NOT DETECTED Final   Cryptosporidium NOT DETECTED NOT DETECTED Final   Cyclospora cayetanensis NOT DETECTED NOT DETECTED Final   Entamoeba histolytica NOT DETECTED NOT DETECTED Final   Giardia lamblia NOT DETECTED NOT DETECTED Final   Adenovirus F40/41 NOT DETECTED NOT DETECTED Final   Astrovirus NOT DETECTED NOT DETECTED Final   Norovirus GI/GII NOT DETECTED NOT DETECTED Final   Rotavirus A NOT DETECTED NOT DETECTED Final   Sapovirus (I,  II, IV, and V) NOT DETECTED NOT DETECTED Final    Comment: Performed at [facility], [address] [place], [place] [address]  C Difficile Quick Screen w PCR reflex     Status: None   Collection Time: 07/12/24 10:36 AM   Specimen: STOOL  Result Value Ref Range Status   C Diff antigen NEGATIVE NEGATIVE Final   C Diff toxin NEGATIVE NEGATIVE Final   C Diff interpretation No C. difficile detected.  Final    Comment: Performed at [facility], [address] [place], [place] [address]    Labs: CBC: Recent Labs  Lab 07/18/24 1150 07/21/24 0153  WBC 5.4 6.0  NEUTROABS 3.8  --   HGB 14.9 15.0  HCT 44.5 43.1  MCV 93.5 90.9  PLT 280.0 313   Basic Metabolic Panel: Recent Labs  Lab 07/18/24 1150 07/21/24 0153  NA 140 138  K 4.1 3.7   CL 99 102  CO2 31 22  GLUCOSE 123* 118*  BUN 12 11  CREATININE 0.73 0.52  CALCIUM  9.8 8.9   Liver Function Tests: Recent Labs  Lab 07/18/24 1150  AST 56*  ALT 34  ALKPHOS 51  BILITOT 0.8  PROT 6.4  ALBUMIN 3.8   CBG: No results for input(s): GLUCAP in the last 168 hours.  Discharge time spent:  38 minutes.  Signed: Drue ONEIDA Potter, MD [facility] 07/22/2024

## 2024-07-22 NOTE — Progress Notes (Signed)
 Progress Note  Patient Name: Allison [name] Date of Encounter: 07/22/2024  Primary Cardiologist: Perla  Subjective   Maintaining sinus rhythm.  No symptoms of angina or cardiac decompensation.  Inpatient Medications    Scheduled Meds:  diltiazem   120 mg Oral Daily   donepezil  5 mg Oral QHS   feeding supplement  237 mL Oral BID BM   rivaroxaban   20 mg Oral Q supper   rosuvastatin   10 mg Oral Daily   Continuous Infusions:  PRN Meds: ALPRAZolam , ondansetron **OR** ondansetron (ZOFRAN) IV   Vital Signs    Vitals:   07/22/24 0311 07/22/24 0725 07/22/24 0906 07/22/24 1134  BP: 99/69 110/78 110/78 94/67  Pulse: 75 (!) 104  (!) 112  Resp: 17 17 17 17   Temp: 97.7 F (36.5 C) (!) 97.5 F (36.4 C)  97.9 F (36.6 C)  TempSrc:  Oral  Oral  SpO2: 96% 96%  98%  Weight:      Height:        Intake/Output Summary (Last 24 hours) at 07/22/2024 1205 Last data filed at 07/21/2024 1704 Gross per 24 hour  Intake 100.33 ml  Output --  Net 100.33 ml   Filed Weights   07/21/24 0146  Weight: 61 kg    Telemetry    Sinus rhythm with PACs - Personally Reviewed  ECG    No new tracings - Personally Reviewed  Physical Exam   GEN: No acute distress.   Neck: No JVD. Cardiac: RRR, I/VI systolic murmur, no rubs, or gallops.  Respiratory: Clear to auscultation bilaterally.  GI: Soft, nontender, non-distended.   MS: No edema; No deformity. Neuro:  Alert and oriented x 3; Nonfocal.  Psych: Normal affect.  Labs    Chemistry Recent Labs  Lab 07/18/24 1150 07/21/24 0153  NA 140 138  K 4.1 3.7  CL 99 102  CO2 31 22  GLUCOSE 123* 118*  BUN 12 11  CREATININE 0.73 0.52  CALCIUM  9.8 8.9  PROT 6.4  --   ALBUMIN 3.8  --   AST 56*  --   ALT 34  --   ALKPHOS 51  --   BILITOT 0.8  --   GFRNONAA  --  >60  ANIONGAP  --  14     Hematology Recent Labs  Lab 07/18/24 1150 07/21/24 0153  WBC 5.4 6.0  RBC 4.76 4.74  HGB 14.9 15.0  HCT 44.5 43.1  MCV 93.5 90.9   MCH  --  31.6  MCHC 33.4 34.8  RDW 15.6* 14.9  PLT 280.0 313    Cardiac EnzymesNo results for input(s): TROPONINI in the last 168 hours. No results for input(s): TROPIPOC in the last 168 hours.   BNPNo results for input(s): BNP, PROBNP in the last 168 hours.   DDimer No results for input(s): DDIMER in the last 168 hours.   Radiology    DG Chest Port 1 View Result Date: 07/21/2024 IMPRESSION: 1. No acute abnormalities. Electronically signed by: Norman Gatlin MD 07/21/2024 02:35 AM EDT RP Workstation: [id number]    Cardiac Studies   2D echo 07/21/2024: 1. Left ventricular ejection fraction, by estimation, is 60 to 65%. Left  ventricular ejection fraction by 2D MOD biplane is 64.2 %. Left  ventricular ejection fraction by PLAX is 69 %. The left ventricle has  normal function. Left ventricular endocardial  border not optimally defined to evaluate regional wall motion. Left  ventricular diastolic parameters are consistent with Grade I diastolic  dysfunction (impaired  relaxation).   2. Right ventricular systolic function is normal. The right ventricular  size is normal.   3. The mitral valve is normal in structure. Mild mitral valve  regurgitation.   4. The aortic valve is tricuspid. Aortic valve regurgitation is not  visualized.   5. The inferior vena cava is normal in size with greater than 50%  respiratory variability, suggesting right atrial pressure of 3 mmHg.   Patient Profile     73 y.o. female with history of PAF, HTN, HLD, and GERD who we are seeing for A-fib with RVR.  Assessment & Plan    1.  PAF with RVR: - Maintaining sinus rhythm status postconversion on diltiazem  drip - Short acting diltiazem  has been consolidated to Cardizem  CD 120 mg daily - Continue rivaroxaban  20 mg daily, does not meet reduced dosing criteria - Echo this admission with preserved LV systolic function - Consider outpatient EP follow-up  2.  Elevated high-sensitivity  troponin: - Mildly elevated and downtrending, not consistent with ACS - No symptoms of angina or cardiac decompensation - Echo with preserved LV systolic function - No plans for inpatient ischemic evaluation at this time - On rivaroxaban  in lieu of aspirin  3.  HTN: - BP soft at times, currently stable - Cardizem  CD as above  4.  HLD: - PTA rosuvastatin        For questions or updates, please contact [facility] Please consult [email or web address] for contact info under Cardiology/STEMI.    Signed, Bernardino Bring, PA-C [facility] Pager: [phone] 07/22/2024, 12:05 PM

## 2024-07-22 NOTE — Care Management Obs Status (Signed)
 MEDICARE OBSERVATION STATUS NOTIFICATION   Patient Details  Name: Allison [name] MRN: [id number] Date of Birth: 05/09/1951   Medicare Observation Status Notification Given:  Yes    Allison [name] 07/22/2024, 10:04 AM

## 2024-07-22 NOTE — Telephone Encounter (Signed)
 Called and spoke with daughter per DPR. Per daughter patient was admitted to hospital on 07/11/24 for hypokalemia. At that time patient's Cardizem  and Metoprolol  were discontinued. Patient was admitted back into the hospital on 07/21/24 for atrial fibrillation. Daughter reports that patient is off of IV medications and now on PO Cardizem  and Metoprolol  and doing better. Daughter wanted to make Dr. Gollan aware.

## 2024-07-22 NOTE — Progress Notes (Signed)
   07/22/24 1700  Assess: MEWS Score  Temp 97.7 F (36.5 C)  BP 111/69  MAP (mmHg) 81  Pulse Rate (!) 125  Resp 18  Level of Consciousness Alert  SpO2 96 %  O2 Device Room Air  Patient Activity (if Appropriate) In bed  Assess: MEWS Score  MEWS Temp 0  MEWS Systolic 0  MEWS Pulse 2  MEWS RR 0  MEWS LOC 0  MEWS Score 2  MEWS Score Color Yellow  Assess: if the MEWS score is Yellow or Red  Were vital signs accurate and taken at a resting state? Yes  Does the patient meet 2 or more of the SIRS criteria? No  MEWS guidelines implemented  Yes, yellow  Treat  MEWS Interventions Considered administering scheduled or prn medications/treatments as ordered  Take Vital Signs  Increase Vital Sign Frequency  Yellow: Q2hr x1, continue Q4hrs until patient remains green for 12hrs  Escalate  MEWS: Escalate Yellow: Discuss with charge nurse and consider notifying provider and/or RRT  Notify: Charge Nurse/RN  Name of Charge Nurse/RN Notified Amy Paraschos, RN  Provider Notification  Provider Name/Title Dr. Ophelia. Darron  Date Provider Notified 07/22/24  Time Provider Notified 1702  Method of Notification  (secure chat)  Notification Reason  (Yellow MEWS)  Provider response En route  Date of Provider Response 07/22/24  Time of Provider Response 1703  Assess: SIRS CRITERIA  SIRS Temperature  0  SIRS Respirations  0  SIRS Pulse 1  SIRS WBC 0  SIRS Score Sum  1

## 2024-07-22 NOTE — Progress Notes (Signed)
 Progress Note   Patient: Allison [name] FMW:[id number] DOB: 1950-11-16 DOA: 07/21/2024     0 DOS: the patient was seen and examined on 07/22/2024   Brief hospital course: From HPI SHARANDA SHINAULT is a 73 y.o. female with medical history significant for Afibrilllation on Xarelto , HTN, GERD, HL, Dementia, with recent hospitalization (9/18 - 07/13/2024) for acute diarrhea with anion gap metabolic acidosis, being admitted with A-fib with RVR .   She presented to the ED with a 1 day complaint of intermittent heart palpitations and low blood pressure readings at home.  She denies chest pain or shortness of breath. Daughter states she was advised to hold diltiazem  and metoprolol  for SBP under 120 so she has not received it since discharge from the hospital since her blood pressure never got above 117.. On arrival to the ED heart rate 164 with BP 80/62, afebrile with O2 sat 95% on room air. Labs notable for troponin of 27.  CBC and BMP within normal limits.Next EKG showed A-fib at 151 Chest x-ray clear   Cardioversion was offered due to unstable A-fib however family declined She was treated with NS bolus x 2, diltiazem  5 mg IV bolus and subsequently placed on a diltiazem  infusion. Her heart rate improved to one teens at the time of admission Observation requested .      Assessment and Plan:  Atrial fibrillation with rapid ventricular response (HCC) Cardizem  have been consolidated to 20 mg daily Cardiologist on board and case discussed Continue continue Xarelto    Hypotension-improved Received a 2 L bolus in the ED Will continue gentle fluids    Hyperlipidemia -- continue statins   Mild dementia -- patient will continue home meds    DVT prophylaxis: Xarelto    Consults: none   Advance Care Planning:   Code Status: Prior    Family Communication: none   Disposition Plan: Back to previous home environment  Subjective:  Seen and examined at bedside this morning Denies nausea  vomiting abdominal pain chest pain Have been having intermittent increased heart rate Cardiologist recommended complete heparin 1 more night  Physical Exam: Vitals and nursing note reviewed.  Constitutional:      General: She is not in acute distress. HENT:     Head: Normocephalic and atraumatic.  Cardiovascular:     Rate and Rhythm: Tachycardia present.  Pulmonary:     Effort: Pulmonary effort is normal.     Breath sounds: Normal breath sounds.  Abdominal:     Palpations: Abdomen is soft.     Tenderness: There is no abdominal tenderness.  Neurological:     Mental Status: Mental status is at baseline.     Vitals:   07/22/24 1435 07/22/24 1450 07/22/24 1700 07/22/24 1714  BP:  (!) 96/52 111/69   Pulse: 99 77 (!) 125   Resp: 16 17 18 18   Temp:   97.7 F (36.5 C)   TempSrc:   Oral   SpO2:  95% 96%   Weight:      Height:        Data Reviewed: EKG reviewed    Latest Ref Rng & Units 07/21/2024    1:53 AM 07/18/2024   11:50 AM 07/13/2024    7:10 AM  BMP  Glucose 70 - 99 mg/dL 881  876  97   BUN 8 - 23 mg/dL 11  12  8    Creatinine 0.44 - 1.00 mg/dL 9.47  9.26  9.57   Sodium 135 - 145 mmol/L 138  140  140   Potassium 3.5 - 5.1 mmol/L 3.7  4.1  3.2   Chloride 98 - 111 mmol/L 102  99  101   CO2 22 - 32 mmol/L 22  31  32   Calcium  8.9 - 10.3 mg/dL 8.9  9.8  8.1        Latest Ref Rng & Units 07/21/2024    1:53 AM 07/18/2024   11:50 AM 07/12/2024    5:31 AM  CBC  WBC 4.0 - 10.5 K/uL 6.0  5.4  4.5   Hemoglobin 12.0 - 15.0 g/dL 84.9  85.0  86.8   Hematocrit 36.0 - 46.0 % 43.1  44.5  38.4   Platelets 150 - 400 K/uL 313  280.0  202      Author: Drue ONEIDA Potter, MD 07/22/2024 6:02 PM  For on call review [email or web address].

## 2024-07-22 NOTE — Discharge Instructions (Addendum)
 [facility] [address] [place]  [id number]  Dr. Tresia office will be calling you to make follow up appointment for 10-14 days from now.   Do you feel isolated?  [facility] offers a [employer] that anyone can call toll free at [phone]. The friendship line is available 24 hours a day  [employer] is a Program of All-inclusive Care for the Elderly (PACE). Their mission is to promote and sustain the independence of seniors wishing to remain in the community. They provide seniors with comprehensive long-term health, social, medical and dietary care. Their program is a safe alternative to nursing home care. [phone]  [facility] Physical Address [facility] [address] [place], [place] [address] Phone: [phone]. . Online zoom yoga class, connect with others without leaving your home [facility] offers [place] dance cardio sessions for individuals via Zoom. This program provides: - Dance fitness activities Please contact program for more information. Servinganyone in need adults 18+ hiv/aids individuals families Call [phone]  Email [email or web address] [email or web address] to get more info  [employer] offers an online [employer] to individuals where they can receive help to focus on their best health. Whether you're a [employer] member or not, the neighborhood center offers a... Main Serviceshealth education  exercise & fitness  community support services  recreation  virtual support Other Servicessupport groups Servinganyone in need adults young adults teens seniors individuals families [email or web address] to get more info  Schedule on their website  [facility] offers an array of activities for adults age 45 and over. This program provides:- Fitness and health programs- Tech classes- Activity books Main Serviceshealth education  community support services   exercise & fitness  recreation  more education Servingseniors  Call [phone]    For more resources go online to [email or web address] and type in you zipcode

## 2024-07-22 NOTE — Telephone Encounter (Signed)
 Copied from CRM [id number]. Topic: General - Other >> Jul 22, 2024  2:53 PM Henretta I wrote: Reason for CRM: Shona from [facility] was calling because patient is being discharged from [facility] hospital today, she was calling to make sure that Dr. Glendia will follow with patients orders for physcial therapy and occupational therapy.Call back number is  [phone], if unable to call back today, she is off tomorrow but she stated someone else will answer and you are able to relay to them if she will follow orders or not.

## 2024-07-22 NOTE — Telephone Encounter (Signed)
 Daughter called to report patient was admitted to [facility] and has been put back on Metoprolol .

## 2024-07-23 ENCOUNTER — Encounter: Payer: Self-pay | Admitting: Cardiovascular Disease

## 2024-07-23 DIAGNOSIS — Z79899 Other long term (current) drug therapy: Secondary | ICD-10-CM | POA: Diagnosis not present

## 2024-07-23 DIAGNOSIS — Z7901 Long term (current) use of anticoagulants: Secondary | ICD-10-CM | POA: Diagnosis not present

## 2024-07-23 DIAGNOSIS — Z9851 Tubal ligation status: Secondary | ICD-10-CM | POA: Diagnosis not present

## 2024-07-23 DIAGNOSIS — I82612 Acute embolism and thrombosis of superficial veins of left upper extremity: Secondary | ICD-10-CM | POA: Diagnosis not present

## 2024-07-23 DIAGNOSIS — I10 Essential (primary) hypertension: Secondary | ICD-10-CM | POA: Diagnosis present

## 2024-07-23 DIAGNOSIS — E872 Acidosis, unspecified: Secondary | ICD-10-CM | POA: Diagnosis present

## 2024-07-23 DIAGNOSIS — I808 Phlebitis and thrombophlebitis of other sites: Secondary | ICD-10-CM | POA: Diagnosis not present

## 2024-07-23 DIAGNOSIS — F03A0 Unspecified dementia, mild, without behavioral disturbance, psychotic disturbance, mood disturbance, and anxiety: Secondary | ICD-10-CM | POA: Diagnosis present

## 2024-07-23 DIAGNOSIS — I480 Paroxysmal atrial fibrillation: Secondary | ICD-10-CM | POA: Diagnosis present

## 2024-07-23 DIAGNOSIS — Z888 Allergy status to other drugs, medicaments and biological substances status: Secondary | ICD-10-CM | POA: Diagnosis not present

## 2024-07-23 DIAGNOSIS — I4892 Unspecified atrial flutter: Secondary | ICD-10-CM | POA: Diagnosis present

## 2024-07-23 DIAGNOSIS — K219 Gastro-esophageal reflux disease without esophagitis: Secondary | ICD-10-CM | POA: Diagnosis present

## 2024-07-23 DIAGNOSIS — I4819 Other persistent atrial fibrillation: Secondary | ICD-10-CM | POA: Diagnosis not present

## 2024-07-23 DIAGNOSIS — Z604 Social exclusion and rejection: Secondary | ICD-10-CM | POA: Diagnosis present

## 2024-07-23 DIAGNOSIS — M7989 Other specified soft tissue disorders: Secondary | ICD-10-CM | POA: Diagnosis not present

## 2024-07-23 DIAGNOSIS — E7800 Pure hypercholesterolemia, unspecified: Secondary | ICD-10-CM | POA: Diagnosis present

## 2024-07-23 DIAGNOSIS — I4891 Unspecified atrial fibrillation: Secondary | ICD-10-CM | POA: Diagnosis not present

## 2024-07-23 DIAGNOSIS — R002 Palpitations: Secondary | ICD-10-CM | POA: Diagnosis present

## 2024-07-23 DIAGNOSIS — Z833 Family history of diabetes mellitus: Secondary | ICD-10-CM | POA: Diagnosis not present

## 2024-07-23 DIAGNOSIS — I959 Hypotension, unspecified: Secondary | ICD-10-CM | POA: Diagnosis present

## 2024-07-23 LAB — BASIC METABOLIC PANEL WITH GFR
Anion gap: 10 (ref 5–15)
BUN: 10 mg/dL (ref 8–23)
CO2: 27 mmol/L (ref 22–32)
Calcium: 8.8 mg/dL — ABNORMAL LOW (ref 8.9–10.3)
Chloride: 104 mmol/L (ref 98–111)
Creatinine, Ser: 0.59 mg/dL (ref 0.44–1.00)
GFR, Estimated: >60 mL/min (ref >60)
Glucose, Bld: 136 mg/dL — ABNORMAL HIGH (ref 70–99)
Potassium: 3.5 mmol/L (ref 3.5–5.1)
Sodium: 141 mmol/L (ref 135–145)

## 2024-07-23 LAB — CBC WITH DIFFERENTIAL/PLATELET
Abs Immature Granulocytes: 0.01 K/uL (ref 0.00–0.07)
Basophils Absolute: 0 K/uL (ref 0.0–0.1)
Basophils Relative: 1 %
Eosinophils Absolute: 0.1 K/uL (ref 0.0–0.5)
Eosinophils Relative: 2 %
HCT: 40.2 % (ref 36.0–46.0)
Hemoglobin: 13.7 g/dL (ref 12.0–15.0)
Immature Granulocytes: 0 %
Lymphocytes Relative: 18 %
Lymphs Abs: 1 K/uL (ref 0.7–4.0)
MCH: 31.4 pg (ref 26.0–34.0)
MCHC: 34.1 g/dL (ref 30.0–36.0)
MCV: 92.2 fL (ref 80.0–100.0)
Monocytes Absolute: 0.5 K/uL (ref 0.1–1.0)
Monocytes Relative: 9 %
Neutro Abs: 3.8 K/uL (ref 1.7–7.7)
Neutrophils Relative %: 70 %
Platelets: 253 K/uL (ref 150–400)
RBC: 4.36 MIL/uL (ref 3.87–5.11)
RDW: 15.4 % (ref 11.5–15.5)
WBC: 5.4 K/uL (ref 4.0–10.5)
nRBC: 0 % (ref 0.0–0.2)

## 2024-07-23 MED ORDER — DIGOXIN 0.25 MG/ML IJ SOLN
0.1250 mg | Freq: Once | INTRAMUSCULAR | Status: AC
  Administered 2024-07-23: 0.125 mg via INTRAVENOUS
  Filled 2024-07-23: qty 2

## 2024-07-23 MED ORDER — METOPROLOL TARTRATE 25 MG PO TABS
12.5000 mg | ORAL_TABLET | Freq: Four times a day (QID) | ORAL | Status: DC
  Administered 2024-07-23 (×2): 12.5 mg via ORAL
  Filled 2024-07-23 (×2): qty 1

## 2024-07-23 MED ORDER — POTASSIUM CHLORIDE CRYS ER 20 MEQ PO TBCR
20.0000 meq | EXTENDED_RELEASE_TABLET | Freq: Once | ORAL | Status: AC
  Administered 2024-07-23: 20 meq via ORAL
  Filled 2024-07-23: qty 1

## 2024-07-23 MED ORDER — POTASSIUM CHLORIDE CRYS ER 20 MEQ PO TBCR
30.0000 meq | EXTENDED_RELEASE_TABLET | Freq: Once | ORAL | Status: AC
  Administered 2024-07-23: 30 meq via ORAL
  Filled 2024-07-23: qty 1

## 2024-07-23 NOTE — TOC Progression Note (Signed)
 Transition of Care [facility]) - Progression Note    Patient Details  Name: Allison [name] MRN: [id number] Date of Birth: 1951/08/31  Transition of Care [facility]) CM/SW Contact  Lauraine JAYSON Carpen, LCSW Phone Number: 07/23/2024, 3:28 PM  Clinical Narrative:   CSW left brochures for medical alert necklaces in the room with patient. Daughter is aware. Daughter thinks patient's PCP made a referral for [facility] last week. CSW called [facility]. They do think they have the referral but also took the information from CSW. They said there is currently a wait list but they will call patient once a spot opens up. They will also have to do a home visit prior to starting services. CSW notified [facility] liaison that patient is not ready for discharge yet.    Barriers to Discharge: No Barriers Identified               Expected Discharge Plan and Services         Expected Discharge Date: 07/22/24                         HH Arranged: PT HH Agency: [facility] Date HH Agency Contacted: 07/22/24 Time HH Agency Contacted: [phone]     Social Drivers of Health (SDOH) Interventions SDOH Screenings   Food Insecurity: No Food Insecurity (07/21/2024)  Housing: Low Risk  (07/21/2024)  Transportation Needs: No Transportation Needs (07/21/2024)  Utilities: Not At Risk (07/21/2024)  Depression (PHQ2-9): Low Risk  (02/20/2023)  Financial Resource Strain: Low Risk  (02/20/2023)  Physical Activity: Sufficiently Active (02/20/2023)  Social Connections: Socially Isolated (07/22/2024)  Stress: No Stress Concern Present (02/20/2023)  Tobacco Use: Low Risk  (07/21/2024)    Readmission Risk Interventions     No data to display

## 2024-07-23 NOTE — Progress Notes (Signed)
 Notified the patent's heart rate trended into the 150s to 170s bpm with movement into the recliner. She remains in Afib with RVR with rates ranging in the 110s to 170s bpm with NSVT vs Afib with aberrancy. Hemodynamically stable with BP 125/64.  She is asymptomatic from an Afib perspective. No symptoms of angina or cardiac decompensation.   Recommendations: -Stop newly started flecainide -Start Lopressor  12.5 mg every 6 hours -EP to evaluate on 10/1 -Continue Cardizem  CD 120 mg daily

## 2024-07-23 NOTE — Progress Notes (Signed)
   07/23/24 1346  Vitals  BP 125/64  MAP (mmHg) 84  BP Location Right Arm  BP Method Automatic  Patient Position (if appropriate) Lying  MEWS COLOR  MEWS Score Color Yellow  MEWS Score  MEWS Temp 0  MEWS Systolic 0  MEWS Pulse 2  MEWS RR 0  MEWS LOC 0  MEWS Score 2   Notified Dunn, Ryan M, PA-C of HR=170s with ambulation and returning to 150s at rest with stable blood pressure.  Awaiting orders.

## 2024-07-23 NOTE — Progress Notes (Signed)
 Progress Note  Patient Name: Allison [name] Date of Encounter: 07/23/2024  Primary Cardiologist: Perla  Subjective   Started on flecainide on 9/29.  Redeveloped Afib around 20:44 on 9/29; remains in Afib with RVR at this time with ventricular rates in the 130s to 140s bpm.  She denies symptoms of chest pain, palpitations, dizziness, presyncope, or syncope, or dyspnea.  She is unaware she is back in A-fib with RVR at this time.  No significant lower extremity swelling or orthopnea.  Inpatient Medications    Scheduled Meds:  donepezil  5 mg Oral QHS   feeding supplement  237 mL Oral BID BM   flecainide  50 mg Oral Q12H   rivaroxaban   20 mg Oral Q supper   rosuvastatin   10 mg Oral Daily   Continuous Infusions:  PRN Meds: ALPRAZolam , ondansetron **OR** ondansetron (ZOFRAN) IV   Vital Signs    Vitals:   07/23/24 0302 07/23/24 0700 07/23/24 0730 07/23/24 0758  BP: 113/76  95/74 102/71  Pulse: (!) 109   (!) 128  Resp: 16 17 20    Temp: 97.7 F (36.5 C)  97.8 F (36.6 C) 97.8 F (36.6 C)  TempSrc: Oral  Oral Oral  SpO2: 98%  97% 98%  Weight:      Height:        Intake/Output Summary (Last 24 hours) at 07/23/2024 1008 Last data filed at 07/23/2024 0730 Gross per 24 hour  Intake 120 ml  Output --  Net 120 ml   Filed Weights   07/21/24 0146  Weight: 61 kg    Telemetry    Redeveloped Afib around 20:44 on 9/29, remains in Afib with RVR with ventricular rates in the 130s to 140s bpm - Personally Reviewed  ECG    No new tracings - Personally Reviewed  Physical Exam   GEN: No acute distress.   Neck: No JVD. Cardiac: Tachycardic, IRIR, I/VI systolic murmur, no rubs, or gallops.  Respiratory: Clear to auscultation bilaterally.  GI: Soft, nontender, non-distended.   MS: No edema; No deformity. Neuro:  Alert and oriented x 3; Nonfocal.  Psych: Normal affect.  Labs    Chemistry Recent Labs  Lab 07/18/24 1150 07/21/24 0153  NA 140 138  K 4.1 3.7  CL  99 102  CO2 31 22  GLUCOSE 123* 118*  BUN 12 11  CREATININE 0.73 0.52  CALCIUM  9.8 8.9  PROT 6.4  --   ALBUMIN 3.8  --   AST 56*  --   ALT 34  --   ALKPHOS 51  --   BILITOT 0.8  --   GFRNONAA  --  >60  ANIONGAP  --  14     Hematology Recent Labs  Lab 07/18/24 1150 07/21/24 0153  WBC 5.4 6.0  RBC 4.76 4.74  HGB 14.9 15.0  HCT 44.5 43.1  MCV 93.5 90.9  MCH  --  31.6  MCHC 33.4 34.8  RDW 15.6* 14.9  PLT 280.0 313    Cardiac EnzymesNo results for input(s): TROPONINI in the last 168 hours. No results for input(s): TROPIPOC in the last 168 hours.   BNPNo results for input(s): BNP, PROBNP in the last 168 hours.   DDimer No results for input(s): DDIMER in the last 168 hours.   Radiology    DG Chest Port 1 View Result Date: 07/21/2024 IMPRESSION: 1. No acute abnormalities. Electronically signed by: Norman Gatlin MD 07/21/2024 02:35 AM EDT RP Workstation: [id number]    Cardiac Studies  2D echo 07/21/2024: 1. Left ventricular ejection fraction, by estimation, is 60 to 65%. Left  ventricular ejection fraction by 2D MOD biplane is 64.2 %. Left  ventricular ejection fraction by PLAX is 69 %. The left ventricle has  normal function. Left ventricular endocardial  border not optimally defined to evaluate regional wall motion. Left  ventricular diastolic parameters are consistent with Grade I diastolic  dysfunction (impaired relaxation).   2. Right ventricular systolic function is normal. The right ventricular  size is normal.   3. The mitral valve is normal in structure. Mild mitral valve  regurgitation.   4. The aortic valve is tricuspid. Aortic valve regurgitation is not  visualized.   5. The inferior vena cava is normal in size with greater than 50%  respiratory variability, suggesting right atrial pressure of 3 mmHg.   Patient Profile     73 y.o. female with history of PAF, HTN, HLD, and GERD who we are seeing for A-fib with RVR.  Assessment & Plan     1.  PAF with RVR: - Presented in Afib status post conversion on diltiazem  drip; was maintaining sinus on 9/29 with consolidation of short-acting dilt to long-acting and addition of flecainide 50 mg bid with loading dose of 100 mg on 9/29 - Redeveloped Afib at 20:44 on 9/29 - Currently in Afib with RVR into the 130s to 140s with BPs in the 90s to low 100 mmHg systolic - BP soft, precluding escalation of Cardizem  or addition of metoprolol   - Will give IV digoxin 0.125 mg x 1 this morning  - Continue Cardizem  CD 120 mg  - Will give 30 mEq of KCl - Continue rivaroxaban  20 mg daily, does not meet reduced dosing criteria - Echo this admission with preserved LV systolic function - Recommend outpatient EP follow-up - If patient has difficult to control ventricular rates will need to pursue DCCV prior to discharge, hopefully this can be deferred  2.  Elevated high-sensitivity troponin: - Mildly elevated and downtrending, not consistent with ACS - No symptoms of angina or cardiac decompensation - Echo with preserved LV systolic function - No plans for inpatient ischemic evaluation at this time - On rivaroxaban  in lieu of aspirin  3.  HTN: - BP soft at times, currently stable - Cardizem  CD as above  4.  HLD: - PTA rosuvastatin       For questions or updates, please contact [facility] Please consult [email or web address] for contact info under Cardiology/STEMI.    Signed, Bernardino Bring, PA-C [facility] Pager: [phone] 07/23/2024, 10:08 AM

## 2024-07-23 NOTE — Progress Notes (Signed)
   07/23/24 1524  Mobility  Activity Refused and notified nurse if applicable  Assistive Device None  Mobility Specialist Start Time (ACUTE ONLY) [phone]  Mobility Specialist Stop Time (ACUTE ONLY) 0948  Mobility Specialist Time Calculation (min) (ACUTE ONLY) 20 min   Mobility Specialist - Progress Note Pt refused mobility for now. Will check pt at a later date.  Clem Rodes Mobility Specialist 07/23/24, 3:28 PM

## 2024-07-23 NOTE — Progress Notes (Signed)
 Patient: Allison [name] FMW:[id number] DOB: 04/09/1951 DOA: 07/21/2024     0 DOS: the patient was seen and examined on 07/22/2024   Brief hospital course: From HPI Allison [name] is a 73 y.o. female with medical history significant for Afibrilllation on Xarelto , HTN, GERD, HL, Dementia, with recent hospitalization (9/18 - 07/13/2024) for acute diarrhea with anion gap metabolic acidosis, being admitted with A-fib with RVR .   She presented to the ED with a 1 day complaint of intermittent heart palpitations and low blood pressure readings at home.  She denies chest pain or shortness of breath. Daughter states she was advised to hold diltiazem  and metoprolol  for SBP under 120 so she has not received it since discharge from the hospital since her blood pressure never got above 117.. On arrival to the ED heart rate 164 with BP 80/62, afebrile with O2 sat 95% on room air. Labs notable for troponin of 27.  CBC and BMP within normal limits.Next EKG showed A-fib at 151 Chest x-ray clear   Cardioversion was offered due to unstable A-fib however family declined She was treated with NS bolus x 2, diltiazem  5 mg IV bolus and subsequently placed on a diltiazem  infusion. Her heart rate improved to one teens at the time of admission Observation requested .       Assessment and Plan:  Atrial fibrillation with rapid ventricular response (HCC) Cardizem  have been consolidated to 20 mg daily Cardiologist on board and case discussed Continue continue Xarelto    Hypotension-improved Received a 2 L bolus in the ED Will continue gentle fluids    Hyperlipidemia -- continue statins   Mild dementia -- patient will continue home meds     DVT prophylaxis: Xarelto    Consults: none   Advance Care Planning:   Code Status: Prior    Family Communication: none   Disposition Plan: Back to previous home environment   Subjective:  Seen and examined at bedside this morning Denies nausea vomiting abdominal  pain chest pain Have been having intermittent increased heart rate Cardiologist recommended complete heparin 1 more night   Physical Exam: Vitals and nursing note reviewed.  Constitutional:      General: She is not in acute distress. HENT:     Head: Normocephalic and atraumatic.  Cardiovascular:     Rate and Rhythm: Tachycardia present.  Pulmonary:     Effort: Pulmonary effort is normal.     Breath sounds: Normal breath sounds.  Abdominal:     Palpations: Abdomen is soft.     Tenderness: There is no abdominal tenderness.  Neurological:     Mental Status: Mental status is at baseline.             Vitals:    07/22/24 1435 07/22/24 1450 07/22/24 1700 07/22/24 1714  BP:   (!) 96/52 111/69    Pulse: 99 77 (!) 125    Resp: 16 17 18 18   Temp:     97.7 F (36.5 C)    TempSrc:     Oral    SpO2:   95% 96%    Weight:          Height:              Data Reviewed: EKG reviewed     Latest Ref Rng & Units 07/21/2024    1:53 AM 07/18/2024   11:50 AM 07/13/2024    7:10 AM  BMP  Glucose 70 - 99 mg/dL 881  876  97  BUN 8 - 23 mg/dL 11  12  8    Creatinine 0.44 - 1.00 mg/dL 9.47  9.26  9.57   Sodium 135 - 145 mmol/L 138  140  140   Potassium 3.5 - 5.1 mmol/L 3.7  4.1  3.2   Chloride 98 - 111 mmol/L 102  99  101   CO2 22 - 32 mmol/L 22  31  32   Calcium  8.9 - 10.3 mg/dL 8.9  9.8  8.1         Latest Ref Rng & Units 07/21/2024    1:53 AM 07/18/2024   11:50 AM 07/12/2024    5:31 AM  CBC  WBC 4.0 - 10.5 K/uL 6.0  5.4  4.5   Hemoglobin 12.0 - 15.0 g/dL 84.9  85.0  86.8   Hematocrit 36.0 - 46.0 % 43.1  44.5  38.4   Platelets 150 - 400 K/uL 313  280.0  202       Author: Drue ONEIDA Potter, MD 07/22/2024 6:02 PM

## 2024-07-23 NOTE — Plan of Care (Signed)
?  Problem: Education: ?Goal: Knowledge of General Education information will improve ?Description: Including pain rating scale, medication(s)/side effects and non-pharmacologic comfort measures ?Outcome: Progressing ?  ?Problem: Clinical Measurements: ?Goal: Ability to maintain clinical measurements within normal limits will improve ?Outcome: Progressing ?Goal: Will remain free from infection ?Outcome: Progressing ?Goal: Diagnostic test results will improve ?Outcome: Progressing ?Goal: Respiratory complications will improve ?Outcome: Progressing ?Goal: Cardiovascular complication will be avoided ?Outcome: Progressing ?  ?Problem: Nutrition: ?Goal: Adequate nutrition will be maintained ?Outcome: Progressing ?  ?Problem: Activity: ?Goal: Risk for activity intolerance will decrease ?Outcome: Progressing ?  ?

## 2024-07-23 NOTE — Progress Notes (Signed)
 Progress Note   Patient: Allison [name] FMW:[id number] DOB: 09/15/1951 DOA: 07/21/2024     0 DOS: the patient was seen and examined on 07/23/2024     Brief hospital course: From HPI Allison [name] is a 73 y.o. female with medical history significant for Afibrilllation on Xarelto , HTN, GERD, HL, Dementia, with recent hospitalization (9/18 - 07/13/2024) for acute diarrhea with anion gap metabolic acidosis, being admitted with A-fib with RVR .    Cardioversion was offered due to unstable A-fib however family declined She was treated with NS bolus x 2, diltiazem  5 mg IV bolus and subsequently placed on a diltiazem  infusion.  Cardizem  drip weaned off.  Patient currently being followed closely by cardiologist.     Assessment and Plan:  Atrial fibrillation with rapid ventricular response (HCC) Flecainide discontinued by cardiologist and metoprolol  initiated Continue telemetry Cardiologist on board and case discussed Continue continue Xarelto    Hypotension-improved Received a 2 L bolus in the ED Monitor blood pressure closely    Hyperlipidemia -- continue statins   Mild dementia -- patient will continue home meds     DVT prophylaxis: Xarelto    Consults: none   Advance Care Planning:   Code Status: Prior    Family Communication: none   Disposition Plan: Back to previous home environment   Subjective:  Seen and examined at bedside this morning Denies chest pain nausea or vomiting Again this afternoon patient went into A-fib with RVR with a rate of up to 170. Cardiologist recommends continuing current medication and they will let EP cardiologist see patient tomorrow   Physical Exam: Vitals and nursing note reviewed.  Constitutional:      General: She is not in acute distress. HENT:     Head: Normocephalic and atraumatic.  Cardiovascular:     Rate and Rhythm: Tachycardia present.  Pulmonary:     Effort: Pulmonary effort is normal.     Breath sounds: Normal breath  sounds.  Abdominal:     Palpations: Abdomen is soft.     Tenderness: There is no abdominal tenderness.  Neurological:     Mental Status: Mental status is at baseline.    Data Reviewed:    Latest Ref Rng & Units 07/23/2024    9:58 AM 07/21/2024    1:53 AM 07/18/2024   11:50 AM  CBC  WBC 4.0 - 10.5 K/uL 5.4  6.0  5.4   Hemoglobin 12.0 - 15.0 g/dL 86.2  84.9  85.0   Hematocrit 36.0 - 46.0 % 40.2  43.1  44.5   Platelets 150 - 400 K/uL 253  313  280.0        Latest Ref Rng & Units 07/23/2024    9:58 AM 07/21/2024    1:53 AM 07/18/2024   11:50 AM  BMP  Glucose 70 - 99 mg/dL 863  881  876   BUN 8 - 23 mg/dL 10  11  12    Creatinine 0.44 - 1.00 mg/dL 9.40  9.47  9.26   Sodium 135 - 145 mmol/L 141  138  140   Potassium 3.5 - 5.1 mmol/L 3.5  3.7  4.1   Chloride 98 - 111 mmol/L 104  102  99   CO2 22 - 32 mmol/L 27  22  31    Calcium  8.9 - 10.3 mg/dL 8.8  8.9  9.8      Vitals:   07/23/24 1354 07/23/24 1400 07/23/24 1445 07/23/24 1500  BP:    90/65  Pulse:    ROLLEN)  120  Resp: 16 19 16 16   Temp:    98.2 F (36.8 C)  TempSrc:    Oral  SpO2:    98%  Weight:      Height:         Author: Drue ONEIDA Potter, MD 07/23/2024 3:34 PM  For on call review [email or web address].

## 2024-07-23 NOTE — Telephone Encounter (Signed)
 Verbal given to follow OT and PT

## 2024-07-24 ENCOUNTER — Ambulatory Visit: Admitting: Podiatry

## 2024-07-24 DIAGNOSIS — I4819 Other persistent atrial fibrillation: Secondary | ICD-10-CM | POA: Diagnosis not present

## 2024-07-24 DIAGNOSIS — I4891 Unspecified atrial fibrillation: Secondary | ICD-10-CM | POA: Diagnosis not present

## 2024-07-24 LAB — RENAL FUNCTION PANEL
Albumin: 2.6 g/dL — ABNORMAL LOW (ref 3.5–5.0)
Anion gap: 8 (ref 5–15)
BUN: 10 mg/dL (ref 8–23)
CO2: 26 mmol/L (ref 22–32)
Calcium: 8.7 mg/dL — ABNORMAL LOW (ref 8.9–10.3)
Chloride: 108 mmol/L (ref 98–111)
Creatinine, Ser: 0.48 mg/dL (ref 0.44–1.00)
GFR, Estimated: >60 mL/min (ref >60)
Glucose, Bld: 93 mg/dL (ref 70–99)
Phosphorus: 3.8 mg/dL (ref 2.5–4.6)
Potassium: 4 mmol/L (ref 3.5–5.1)
Sodium: 142 mmol/L (ref 135–145)

## 2024-07-24 LAB — CBC WITH DIFFERENTIAL/PLATELET
Abs Immature Granulocytes: 0.02 K/uL (ref 0.00–0.07)
Basophils Absolute: 0.1 K/uL (ref 0.0–0.1)
Basophils Relative: 1 %
Eosinophils Absolute: 0.1 K/uL (ref 0.0–0.5)
Eosinophils Relative: 2 %
HCT: 37.6 % (ref 36.0–46.0)
Hemoglobin: 12.8 g/dL (ref 12.0–15.0)
Immature Granulocytes: 0 %
Lymphocytes Relative: 29 %
Lymphs Abs: 1.4 K/uL (ref 0.7–4.0)
MCH: 31.7 pg (ref 26.0–34.0)
MCHC: 34 g/dL (ref 30.0–36.0)
MCV: 93.1 fL (ref 80.0–100.0)
Monocytes Absolute: 0.6 K/uL (ref 0.1–1.0)
Monocytes Relative: 12 %
Neutro Abs: 2.8 K/uL (ref 1.7–7.7)
Neutrophils Relative %: 56 %
Platelets: 243 K/uL (ref 150–400)
RBC: 4.04 MIL/uL (ref 3.87–5.11)
RDW: 15.5 % (ref 11.5–15.5)
WBC: 5 K/uL (ref 4.0–10.5)
nRBC: 0 % (ref 0.0–0.2)

## 2024-07-24 LAB — MAGNESIUM: Magnesium: 2 mg/dL (ref 1.7–2.4)

## 2024-07-24 MED ORDER — AMIODARONE LOAD VIA INFUSION
150.0000 mg | Freq: Once | INTRAVENOUS | Status: AC
  Administered 2024-07-24: 150 mg via INTRAVENOUS
  Filled 2024-07-24: qty 83.34

## 2024-07-24 MED ORDER — AMIODARONE HCL IN DEXTROSE 360-4.14 MG/200ML-% IV SOLN
30.0000 mg/h | INTRAVENOUS | Status: AC
  Administered 2024-07-24 (×2): 30 mg/h via INTRAVENOUS
  Filled 2024-07-24: qty 200

## 2024-07-24 MED ORDER — AMIODARONE HCL IN DEXTROSE 360-4.14 MG/200ML-% IV SOLN
60.0000 mg/h | INTRAVENOUS | Status: AC
  Administered 2024-07-24 (×2): 60 mg/h via INTRAVENOUS
  Filled 2024-07-24 (×2): qty 200

## 2024-07-24 NOTE — Progress Notes (Signed)
 Physical Therapy Treatment Patient Details Name: Allison [name] MRN: [id number] DOB: 1951-02-06 Today's Date: 07/24/2024   History of Present Illness presented to ER secondary to heart palpitations, hypotension; admitted for management of Afib with RVR    PT Comments  Patient seen for PT session focused on standing and functional balance. Pt able to ambulate 250 fee with no assistive device with supervision for safety. Pt able to march in place and perform single leg balance with LOB after 5s. Tolerated session well  with mild  signs of exertion. Vitals responded normal to exercise. Interventions aimed at improving functional activity tolerance . Continued skilled PT recommended to progress toward functional goals and support discharge readiness.    If plan is discharge home, recommend the following: A little help with walking and/or transfers;A little help with bathing/dressing/bathroom   Can travel by private vehicle        Equipment Recommendations       Recommendations for Other Services       Precautions / Restrictions Restrictions Weight Bearing Restrictions Per Provider Order: No     Mobility  Bed Mobility Overal bed mobility: Modified Independent                  Transfers Overall transfer level: Needs assistance Equipment used: None Transfers: Sit to/from Stand Sit to Stand: Contact guard assist, Supervision                Ambulation/Gait Ambulation/Gait assistance: Contact guard assist, Supervision Gait Distance (Feet): 250 Feet Assistive device: None Gait Pattern/deviations: Step-through pattern, Knee flexed in stance - left, Decreased stance time - left, Decreased step length - right, Narrow base of support           Stairs             Wheelchair Mobility     Tilt Bed    Modified Rankin (Stroke Patients Only)       Balance Overall balance assessment: Needs assistance Sitting-balance support: No upper extremity  supported, Feet supported Sitting balance-Leahy Scale: Normal     Standing balance support: No upper extremity supported Standing balance-Leahy Scale: Good Standing balance comment: able to march in place; fair ability to single leg balance 5s reach leg >5s on L side                            Communication Communication Communication: No apparent difficulties  Cognition Arousal: Alert Behavior During Therapy: WFL for tasks assessed/performed   PT - Cognitive impairments: History of cognitive impairments                       PT - Cognition Comments: Alert and oriented to basic information, follows commands, pleasant and cooperative.  Notable STM deficits, often repeating similar stories/questions multiple times throughout session Following commands: Intact      Cueing Cueing Techniques: Verbal cues  Exercises Other Exercises Other Exercises: pt educated on daily exercise and importance of walking    General Comments        Pertinent Vitals/Pain Pain Assessment Pain Assessment: No/denies pain    Home Living                          Prior Function            PT Goals (current goals can now be found in the care plan section) Acute Rehab PT Goals Patient  Stated Goal: to return home PT Goal Formulation: With patient Time For Goal Achievement: 08/05/24 Potential to Achieve Goals: Good Progress towards PT goals: Progressing toward goals    Frequency    Min 2X/week      PT Plan      Co-evaluation              AM-PAC PT 6 Clicks Mobility   Outcome Measure  Help needed turning from your back to your side while in a flat bed without using bedrails?: None Help needed moving from lying on your back to sitting on the side of a flat bed without using bedrails?: None Help needed moving to and from a bed to a chair (including a wheelchair)?: A Little Help needed standing up from a chair using your arms (e.g., wheelchair or  bedside chair)?: A Little Help needed to walk in hospital room?: A Little Help needed climbing 3-5 steps with a railing? : A Little 6 Click Score: 20    End of Session   Activity Tolerance: Patient tolerated treatment well Patient left: in bed;with call bell/phone within reach;with family/visitor present Nurse Communication: Mobility status PT Visit Diagnosis: Muscle weakness (generalized) (M62.81);Difficulty in walking, not elsewhere classified (R26.2)     Time: 8849-8794 PT Time Calculation (min) (ACUTE ONLY): 15 min  Charges:    $Therapeutic Activity: 8-22 mins PT General Charges $$ ACUTE PT VISIT: 1 Visit                     Allison [name] DPT, PT     Allison [name] 07/24/2024, 12:15 PM

## 2024-07-24 NOTE — Progress Notes (Signed)
 PROGRESS NOTE    [name]  FMW:[id number] DOB: 05-09-1951 DOA: 07/21/2024 PCP: Glendia Shad, MD  Chief Complaint  Patient presents with   Palpitations    Hospital Course:  Allison [name] is a 73 year old female with atrial fibrillation on Xarelto , hypertension, GERD, hyperlipidemia, dementia, with recent hospitalization 9/18 through 9/20 for acute diarrhea with anion gap metabolic acidosis.  She is admitted on this admission with A-fib RVR.  Cardioversion was offered due to unstable A-fib however family declined.  Patient received NS bolus, diltiazem , and was subsequently placed on diltiazem  infusion.  Neurology was consulted.  Cardizem  drip was weaned.  EP was consulted  Subjective: This morning patient reports no events overnight, she denies any chest pain, shortness of breath, or palpitations at this time.   Objective: Vitals:   07/24/24 0404 07/24/24 0811 07/24/24 0953 07/24/24 1139  BP: 91/62 (!) 86/51 (!) 81/49 (!) 92/58  Pulse:  (!) 101 (!) 101 90  Resp: 16  18   Temp: 98 F (36.7 C) 97.8 F (36.6 C)  98 F (36.7 C)  TempSrc: Oral Axillary  Axillary  SpO2: 97% 98% 95% 96%  Weight:      Height:        Intake/Output Summary (Last 24 hours) at 07/24/2024 1641 Last data filed at 07/24/2024 0900 Gross per 24 hour  Intake 480 ml  Output --  Net 480 ml   Filed Weights   07/21/24 0146  Weight: 61 kg    Examination: General exam: Appears calm and comfortable, NAD  Respiratory system: No work of breathing, symmetric chest wall expansion Cardiovascular system: S1 & S2 heard, irregularly irregular rhythm.  Gastrointestinal system: Abdomen is nondistended, soft and nontender.  Neuro: Alert and oriented.  Assessment & Plan:  Principal Problem:   Atrial fibrillation with rapid ventricular response (HCC) Active Problems:   Hypotension   Atrial flutter with rapid ventricular response (HCC)   A-fib with RVR - Initially on diltiazem  drip which  converted to sinus rhythm but then had recurrence of A-fib.  Was briefly on flecainide but then discontinued. - TTE with preserved EF - Cardiology following - EP consulted and planning for amiodarone initiation - Continue to monitor closely on telemetry - Continue home dose Xarelto , no missed doses  Hypotension, improved - Status post fluid resuscitation - Continue to monitor blood pressure closely  Hyperlipidemia - Continue statin  Dementia - Continue home meds  DVT prophylaxis: Xarelto    Code Status: Full Code Disposition:  Inpatient pending clinical resolution  Consultants:  Treatment Team:  Consulting Physician: Darliss Rogue, MD  Procedures:    Antimicrobials:  Anti-infectives (From admission, onward)    None       Data Reviewed: I have personally reviewed following labs and imaging studies CBC: Recent Labs  Lab 07/18/24 1150 07/21/24 0153 07/23/24 0958 07/24/24 0531  WBC 5.4 6.0 5.4 5.0  NEUTROABS 3.8  --  3.8 2.8  HGB 14.9 15.0 13.7 12.8  HCT 44.5 43.1 40.2 37.6  MCV 93.5 90.9 92.2 93.1  PLT 280.0 313 253 243   Basic Metabolic Panel: Recent Labs  Lab 07/18/24 1150 07/21/24 0153 07/23/24 0958 07/24/24 0531  NA 140 138 141 142  K 4.1 3.7 3.5 4.0  CL 99 102 104 108  CO2 31 22 27 26   GLUCOSE 123* 118* 136* 93  BUN 12 11 10 10   CREATININE 0.73 0.52 0.59 0.48  CALCIUM  9.8 8.9 8.8* 8.7*  MG  --   --   --  2.0  PHOS  --   --   --  3.8   GFR: Estimated Creatinine Clearance: 62.1 mL/min (by C-G formula based on SCr of 0.48 mg/dL). Liver Function Tests: Recent Labs  Lab 07/18/24 1150 07/24/24 0531  AST 56*  --   ALT 34  --   ALKPHOS 51  --   BILITOT 0.8  --   PROT 6.4  --   ALBUMIN 3.8 2.6*   CBG: No results for input(s): GLUCAP in the last 168 hours.  No results found for this or any previous visit (from the past 240 hours).   Radiology Studies: No results found.  Scheduled Meds:  donepezil  5 mg Oral QHS   feeding  supplement  237 mL Oral BID BM   rivaroxaban   20 mg Oral Q supper   rosuvastatin   10 mg Oral Daily   Continuous Infusions:  amiodarone 30 mg/hr (07/24/24 1607)     LOS: 1 day  MDM: Patient is high risk for one or more organ failure.  They necessitate ongoing hospitalization for continued IV therapies and subsequent lab monitoring. Total time spent interpreting labs and vitals, reviewing the medical record, coordinating care amongst consultants and care team members, directly assessing and discussing care with the patient and/or family: 55 min Germaine Ripp, DO [facility]  To contact the attending physician between 7A-7P please use Epic Chat. To contact the covering physician during after hours 7P-7A, please review Amion.  07/24/2024, 4:41 PM   *This document has been created with the assistance of dictation software. Please excuse typographical errors. *

## 2024-07-24 NOTE — Consult Note (Signed)
 ELECTROPHYSIOLOGY CONSULT NOTE    Patient ID: Allison [name] MRN: [id number], DOB/AGE: 72-Jan-1952 72 y.o.  Admit date: 07/21/2024 Date of Consult: 07/24/2024  Primary Physician: Glendia Shad, MD Primary Cardiologist: Evalene Lunger, MD  Electrophysiologist: new   Referring Provider: @ATTENDING @  Patient Profile: Allison [name] is a 73 y.o. female with a history of parox AFib, PAC, PVC, HTN, dementia who is being seen today for the evaluation of Afib w RVR at the request of Dr. Mady.  HPI:  Allison [name] is a 72 y.o. female with PMH as above who presented to ER with low BP and palpitations. Of note, she was recently discharged 9/20 with diarrhea, metabolic acidosis. Her metop and dilt was held at discharge.   She was started on dilt gtt in ER and converted to sinus. She was started on flecainide with planned EP follow-up outpatient. Unfortunately, had recurrence of AFib, so flec was held with EP consulted.   On interview, she is asymptomatic of her afib, denies chest pain, chest pressure, palpitations, even during RVR yesterday where HR was 150+. She takes xarelto  diligently.   She requests I call her daughter to update, who confirms that patient diligently takes xarelto  daily. Patient's appetite has decreased substantially and she is not sure how much longer mom will be around with her dementia   Labs Potassium4.0 (10/01 0531) Magnesium   2.0 (10/01 0531) Creatinine, ser  0.48 (10/01 0531) PLT  243 (10/01 0531) HGB  12.8 (10/01 0531) WBC 5.0 (10/01 0531)  .    Past Medical History:  Diagnosis Date   A-fib [facility])    GERD (gastroesophageal reflux disease)    Hypercholesterolemia    Hypertension    Migraine headache    Psoriasis      Surgical History:  Past Surgical History:  Procedure Laterality Date   COLONOSCOPY WITH PROPOFOL  N/A 12/23/2016   Procedure: COLONOSCOPY WITH PROPOFOL ;  Surgeon: Gladis RAYMOND Mariner, MD;  Location: [facility] ENDOSCOPY;  Service:  Endoscopy;  Laterality: N/A;   DILATION AND CURETTAGE OF UTERUS     history of abnormal bleeding   TUBAL LIGATION       Medications Prior to Admission  Medication Sig Dispense Refill Last Dose/Taking   ALPRAZolam  (XANAX ) 0.25 MG tablet TAKE 1 TABLET BY MOUTH ONCE DAILY AS NEEDED FOR ANXIETY 30 tablet 0 Unknown   Calcium  Carb-Cholecalciferol  (CALCIUM  500/VITAMIN D PO) Take 2,000 Units by mouth daily.   Past Month   donepezil (ARICEPT) 5 MG tablet Take 5 mg by mouth at bedtime.   07/20/2024 Bedtime   potassium chloride  (KLOR-CON ) 10 MEQ tablet Take 1 tablet (10 mEq total) by mouth daily. 90 tablet 1 07/20/2024   rivaroxaban  (XARELTO ) 20 MG TABS tablet TAKE 1 TABLET BY MOUTH ONCE DAILY WITH SUPPER 90 tablet 1 07/20/2024 Evening   rosuvastatin  (CRESTOR ) 10 MG tablet Take 1 tablet by mouth once daily 90 tablet 0 07/20/2024   feeding supplement (ENSURE PLUS HIGH PROTEIN) LIQD Take 237 mLs by mouth 2 (two) times daily between meals. 237 mL 0     Inpatient Medications:   amiodarone  150 mg Intravenous Once   donepezil  5 mg Oral QHS   feeding supplement  237 mL Oral BID BM   rivaroxaban   20 mg Oral Q supper   rosuvastatin   10 mg Oral Daily    Allergies:  Allergies  Allergen Reactions   Iodine     Family History  Problem Relation Age of Onset   Asthma Father  Diabetes Mother    CVA Mother    Psoriasis Mother    Breast cancer Maternal Grandmother    Breast cancer Paternal Grandmother    Colon cancer Maternal Uncle    Colon cancer Other        nephew   Diabetes Other        multiple relatives (both sides)   Stroke Brother        Light stroke   Diabetes Brother      Physical Exam: Vitals:   07/23/24 1943 07/23/24 2323 07/24/24 0404 07/24/24 0811  BP: 106/67 97/67 91/62  (!) 86/51  Pulse: (!) 56 (!) 57  (!) 101  Resp:  16 16   Temp: 97.7 F (36.5 C) 98 F (36.7 C) 98 F (36.7 C) 97.8 F (36.6 C)  TempSrc: Oral Oral Oral Axillary  SpO2: 98% 97% 97% 98%  Weight:       Height:        GEN- NAD, A&O x 3, normal affect HEENT: Normocephalic, atraumatic Lungs- CTAB, Normal effort.  Heart- irregular rate and rhythm, No M/G/R.  GI- Soft, NT, ND.  Extremities- No clubbing, cyanosis, or edema   Radiology/Studies: ECHOCARDIOGRAM COMPLETE Result Date: 07/21/2024    ECHOCARDIOGRAM REPORT   Patient Name:   Allison [name] Date of Exam: 07/21/2024 Medical Rec #:  [id number]           Height:       68.0 in Accession #:    [id number]          Weight:       134.5 lb Date of Birth:  1951-04-14           BSA:          1.727 m Patient Age:    72 years            BP:           106/72 mmHg Patient Gender: F                   HR:           91 bpm. Exam Location:  [facility] Procedure: 2D Echo, Color Doppler and Cardiac Doppler (Both Spectral and Color            Flow Doppler were utilized during procedure). Indications:     Atrial Fibrillation  History:         Patient has prior history of Echocardiogram examinations, most                  recent 05/03/2019. Arrythmias:Atrial Fibrillation,                  Signs/Symptoms:Palpitations; Risk Factors:Hypertension,                  Dyslipidemia and GERD.  Sonographer:     Logan Shove RDCS Referring Phys:  [id number] DRUE ONEIDA POTTER Diagnosing Phys: Redell Cave MD IMPRESSIONS  1. Left ventricular ejection fraction, by estimation, is 60 to 65%. Left ventricular ejection fraction by 2D MOD biplane is 64.2 %. Left ventricular ejection fraction by PLAX is 69 %. The left ventricle has normal function. Left ventricular endocardial border not optimally defined to evaluate regional wall motion. Left ventricular diastolic parameters are consistent with Grade I diastolic dysfunction (impaired relaxation).  2. Right ventricular systolic function is normal. The right ventricular size is normal.  3. The mitral valve is normal in structure. Mild mitral valve regurgitation.  4. The aortic  valve is tricuspid. Aortic valve regurgitation is not visualized.  5. The  inferior vena cava is normal in size with greater than 50% respiratory variability, suggesting right atrial pressure of 3 mmHg. FINDINGS  Left Ventricle: Left ventricular ejection fraction, by estimation, is 60 to 65%. Left ventricular ejection fraction by PLAX is 69 %. Left ventricular ejection fraction by 2D MOD biplane is 64.2 %. The left ventricle has normal function. Left ventricular  endocardial border not optimally defined to evaluate regional wall motion. The left ventricular internal cavity size was normal in size. There is no left ventricular hypertrophy. Left ventricular diastolic parameters are consistent with Grade I diastolic dysfunction (impaired relaxation). Right Ventricle: The right ventricular size is normal. No increase in right ventricular wall thickness. Right ventricular systolic function is normal. Left Atrium: Left atrial size was normal in size. Right Atrium: Right atrial size was normal in size. Pericardium: There is no evidence of pericardial effusion. Mitral Valve: The mitral valve is normal in structure. Mild mitral valve regurgitation. Tricuspid Valve: The tricuspid valve is normal in structure. Tricuspid valve regurgitation is mild. Aortic Valve: The aortic valve is tricuspid. Aortic valve regurgitation is not visualized. Aortic valve peak gradient measures 3.1 mmHg. Pulmonic Valve: The pulmonic valve was not well visualized. Pulmonic valve regurgitation is trivial. Aorta: The aortic root is normal in size and structure. Venous: The inferior vena cava is normal in size with greater than 50% respiratory variability, suggesting right atrial pressure of 3 mmHg. IAS/Shunts: No atrial level shunt detected by color flow Doppler.  LEFT VENTRICLE PLAX 2D                        Biplane EF (MOD) LV EF:         Left            LV Biplane EF:   Left                ventricular                      ventricular                ejection                         ejection                fraction by                       fraction by                PLAX is 69                       2D MOD                %.                               biplane is LVIDd:         4.14 cm                          64.2 %. LVIDs:         2.56 cm LV PW:         1.14 cm  Diastology LV IVS:        0.88 cm         LV e' medial:    5.51 cm/s LVOT diam:     2.50 cm         LV E/e' medial:  8.7 LVOT Area:     4.91 cm        LV e' lateral:   6.77 cm/s                                LV E/e' lateral: 7.1  LV Volumes (MOD) LV vol d, MOD    65.5 ml A2C: LV vol d, MOD    64.4 ml A4C: LV vol s, MOD    24.5 ml A2C: LV vol s, MOD    22.7 ml A4C: LV SV MOD A2C:   41.0 ml LV SV MOD A4C:   64.4 ml LV SV MOD BP:    41.6 ml RIGHT VENTRICLE            IVC RV Basal diam:  2.95 cm    IVC diam: 1.51 cm RV S prime:     9.67 cm/s LEFT ATRIUM             Index        RIGHT ATRIUM           Index LA diam:        2.70 cm 1.56 cm/m   RA Area:     15.20 cm LA Vol (A2C):   40.7 ml 23.57 ml/m  RA Volume:   37.40 ml  21.66 ml/m LA Vol (A4C):   27.5 ml 15.93 ml/m LA Biplane Vol: 35.3 ml 20.45 ml/m  AORTIC VALVE AV Area (Vmax): 4.46 cm AV Vmax:        88.60 cm/s AV Peak Grad:   3.1 mmHg LVOT Vmax:      80.50 cm/s  AORTA Ao Root diam: 2.70 cm Ao Asc diam:  2.70 cm MITRAL VALVE MV Area (PHT): 5.09 cm    SHUNTS MV Decel Time: 149 msec    Systemic Diam: 2.50 cm MV E velocity: 48.00 cm/s MV A velocity: 61.10 cm/s MV E/A ratio:  0.79 Redell Cave MD Electronically signed by Redell Cave MD Signature Date/Time: 07/21/2024/4:31:38 PM    Final    DG Chest Port 1 View Result Date: 07/21/2024 EXAM: 1 VIEW(S) XRAY OF THE CHEST 07/21/2024 02:25:22 AM COMPARISON: October 05 2021 . CLINICAL HISTORY: Atrial fibrillation with RVR (HCC) [id number]. PER ER NOTE; Pt in via POV, complaints of intermittent heart palpitations x 1 day. Per daughter, her HR has been high and BP low all day, states she has a log of it. Reports hx of Afibb; denies any chest pain. A/Ox4, NAD noted  at this time. EKG shows Afibb RVR w/ hypotension. CHANGE TO PORTABLE PER DR FLOY FINDINGS: LUNGS AND PLEURA: No focal pulmonary opacity. No pulmonary edema. No pleural effusion. No pneumothorax. HEART AND MEDIASTINUM: No acute abnormality of the cardiac and mediastinal silhouettes. BONES AND SOFT TISSUES: No acute osseous abnormality. IMPRESSION: 1. No acute abnormalities. Electronically signed by: Norman Gatlin MD 07/21/2024 02:35 AM EDT RP Workstation: [id number]    EKG: 07/11/2024 - SR at 81 with PAC, RBBB, LAFB, QTC 541 07/22/2024 at 0206 - AFib w RVR, rate 151, RBBB, LAFB  (personally reviewed)  TELEMETRY: AFib w generally well-controlled rates, periods of elevated ventricular rates to 150-160 (personally  reviewed)  DEVICE HISTORY: none  Assessment/Plan: #) AFib w RVR #) borderline BP #) hypercoag d/t afib Patient presented to [facility] ER with palpitations, found to be in AFib w RVR up to 150. Was started on dilt gtt that initially converted to sinus rhtyhm, then had recurrence of AFib. Was briefly started on flecainide with ongoing elevated ventricular rates Updated TTE with normal LVEF Given patient's borderline BP, unable to increase AVN blocking agents Will start amiodarone IV + gtt Continue 20mg  xarelto  daily for stroke ppx Consider ablation in outpatient setting      For questions or updates, please contact [facility] Please consult [email or web address] for contact info under Cardiology/STEMI.  Signed, Fusaye Wachtel, NP  07/24/2024 9:17 AM

## 2024-07-24 NOTE — Progress Notes (Signed)
 Mobility Specialist - Progress Note  Pre-mobility: HR-60, BP-70/54, SpO2-96%  During mobility: HR-56, BP-73/47, SpO2-96%  Post-mobility: HR-73, BP-87/66, SPO2-95%    07/24/24 1200  Mobility  Activity Stood at bedside;Dangled on edge of bed  Level of Assistance Independent after set-up  Assistive Device None  Range of Motion/Exercises All extremities  Activity Response Tolerated fair  Mobility visit 1 Mobility  Mobility Specialist Start Time (ACUTE ONLY) 1100  Mobility Specialist Stop Time (ACUTE ONLY) 1125  Mobility Specialist Time Calculation (min) (ACUTE ONLY) 25 min   Pt was supine in bed upon entry. Pt agreed to mobility. Took orthostatic vital for caution. Pt was able to EOB independently. Pt was able to STS independently. Used 2 [employer] for safety. Leg extension and ankle flexes at a slow pace. After activity pt returned back to bed in a seated position. Needs in reach and maintenance in room upon exit.  Clem Rodes Mobility Specialist 07/24/24, 12:09 PM

## 2024-07-24 NOTE — Progress Notes (Signed)
 Mobility Specialist - Progress Note   07/24/24 1600  Mobility  Activity Stood at bedside  Level of Assistance Independent after set-up  Assistive Device None  Range of Motion/Exercises Active;All extremities  Activity Response Tolerated well  Mobility visit 1 Mobility  Mobility Specialist Start Time (ACUTE ONLY) 1545  Mobility Specialist Stop Time (ACUTE ONLY) 1555  Mobility Specialist Time Calculation (min) (ACUTE ONLY) 10 min   Pt was supine in bed upon entry. Pt agreed to mobility. Pt was able to EOB independently. Pt was able to STS independently. After activity pt returned to bed with needs in reach.  Clem Rodes Mobility Specialist 07/24/24, 4:12 PM

## 2024-07-25 ENCOUNTER — Inpatient Hospital Stay

## 2024-07-25 DIAGNOSIS — I4891 Unspecified atrial fibrillation: Secondary | ICD-10-CM | POA: Diagnosis not present

## 2024-07-25 MED ORDER — AMIODARONE HCL 200 MG PO TABS: ORAL_TABLET | ORAL | 0 refills | Status: DC

## 2024-07-25 MED ORDER — AMIODARONE HCL 200 MG PO TABS: 200.0000 mg | ORAL_TABLET | Freq: Every day | ORAL | Status: DC

## 2024-07-25 MED ORDER — AMIODARONE HCL 200 MG PO TABS
200.0000 mg | ORAL_TABLET | Freq: Two times a day (BID) | ORAL | Status: DC
  Administered 2024-07-25: 200 mg via ORAL
  Filled 2024-07-25: qty 1

## 2024-07-25 NOTE — Care Management Important Message (Signed)
 Important Message  Patient Details  Name: Allison [name] MRN: [id number] Date of Birth: 04/25/1951   Important Message Given:  Yes - Medicare IM     Rojelio [name] 07/25/2024, 4:39 PM

## 2024-07-25 NOTE — Discharge Summary (Signed)
 DISCHARGE SUMMARY    Allison [name] FMW:[id number] DOB: 1951-06-28 DOA: 07/21/2024  PCP: Glendia Shad, MD  Admit date: 07/21/2024 Discharge date: 07/25/2024   Recommendations for Outpatient Follow-up:  Follow up with PCP in 1-2 weeks to review chronic medication management Follow-up with EP as scheduled 10/30    Hospital Course: [name] 73 year old female with atrial fibrillation on Xarelto , hypertension, GERD, hyperlipidemia, dementia, who presents on this admission with A-fib RVR.  Cardioversion was offered due to unstable A-fib however family declined.  Patient received NS bolus, diltiazem , and subsequently placed on diltiazem  infusion.  Cardiology and EP were consulted.  Patient was ultimately transitioned to amiodarone with success.  She will be discharged on tapering amiodarone dose and has follow-up appointment already scheduled with EP   A-fib with RVR - Initially on diltiazem  drip which converted to sinus rhythm but then had recurrence of A-fib.  Was briefly on flecainide but then discontinued. - TTE with preserved EF - Sinus rhythm now rate controlled on amiodarone - Discharged with Amio taper - Outpatient follow-up with EP scheduled for 10/30 - Continue home dose Xarelto , no missed doses   Hypotension, improved - Status post fluid resuscitation   Hyperlipidemia - Continue statin   Dementia - Continue home meds  On supplemental potassium - Patient has history of severe hypokalemia on prior admission at which time she was experiencing significant diarrhea.  Potassium has been WNL throughout this admission.  Discussed she may no longer need supplementation outpatient.  Daughter reports she occasionally has diarrhea and will keep potassium on hand for those days ( )  Discharge Instructions  Discharge Instructions     Amb referral to AFIB Clinic   Complete by: As directed    Call MD for:  difficulty breathing, headache or visual disturbances    Complete by: As directed    Call MD for:  persistant dizziness or light-headedness   Complete by: As directed    Call MD for:  persistant nausea and vomiting   Complete by: As directed    Call MD for:  severe uncontrolled pain   Complete by: As directed    Call MD for:  temperature >100.4   Complete by: As directed    Diet general   Complete by: As directed    Discharge instructions   Complete by: As directed    Follow-up with cardiology as discussed, the appointment has been made for you.   Increase activity slowly   Complete by: As directed       Allergies as of 07/25/2024       Reactions   Iodine         Medication List     PAUSE taking these medications    potassium chloride  10 MEQ tablet Wait to take this until your doctor or other care provider tells you to start again. Commonly known as: KLOR-CON  Take 1 tablet (10 mEq total) by mouth daily.       TAKE these medications    ALPRAZolam  0.25 MG tablet Commonly known as: XANAX  TAKE 1 TABLET BY MOUTH ONCE DAILY AS NEEDED FOR ANXIETY   amiodarone 200 MG tablet Commonly known as: PACERONE Take 1 tablet (200 mg total) by mouth 2 (two) times daily for 14 days, THEN 1 tablet (200 mg total) daily for 21 days. Start taking on: July 25, 2024   CALCIUM  500/VITAMIN D PO Take 2,000 Units by mouth daily.   donepezil 5 MG tablet Commonly known as: ARICEPT Take 5 mg by  mouth at bedtime.   feeding supplement Liqd Take 237 mLs by mouth 2 (two) times daily between meals.   rosuvastatin  10 MG tablet Commonly known as: CRESTOR  Take 1 tablet by mouth once daily   Xarelto  20 MG Tabs tablet Generic drug: rivaroxaban  TAKE 1 TABLET BY MOUTH ONCE DAILY WITH SUPPER        Follow-up Information     [facility].   Specialty: Home Health Services Why: They will follow up with you for your home health therapy needs. Contact information: [address] [place]  [id number]                Allergies  Allergen Reactions   Iodine     Consultations: Treatment Team:  Darliss Rogue, MD   Procedures/Studies: ECHOCARDIOGRAM COMPLETE Result Date: 07/21/2024    ECHOCARDIOGRAM REPORT   Patient Name:   Allison [name] Date of Exam: 07/21/2024 Medical Rec #:  [id number]           Height:       68.0 in Accession #:    [id number]          Weight:       134.5 lb Date of Birth:  [birth year]           BSA:          1.727 m Patient Age:    72 years            BP:           106/72 mmHg Patient Gender: F                   HR:           91 bpm. Exam Location:  [facility] Procedure: 2D Echo, Color Doppler and Cardiac Doppler (Both Spectral and Color            Flow Doppler were utilized during procedure). Indications:     Atrial Fibrillation  History:         Patient has prior history of Echocardiogram examinations, most                  recent 05/03/2019. Arrythmias:Atrial Fibrillation,                  Signs/Symptoms:Palpitations; Risk Factors:Hypertension,                  Dyslipidemia and GERD.  Sonographer:     Logan Shove RDCS Referring Phys:  [id number] DRUE ONEIDA POTTER Diagnosing Phys: Rogue Darliss MD IMPRESSIONS  1. Left ventricular ejection fraction, by estimation, is 60 to 65%. Left ventricular ejection fraction by 2D MOD biplane is 64.2 %. Left ventricular ejection fraction by PLAX is 69 %. The left ventricle has normal function. Left ventricular endocardial border not optimally defined to evaluate regional wall motion. Left ventricular diastolic parameters are consistent with Grade I diastolic dysfunction (impaired relaxation).  2. Right ventricular systolic function is normal. The right ventricular size is normal.  3. The mitral valve is normal in structure. Mild mitral valve regurgitation.  4. The aortic valve is tricuspid. Aortic valve regurgitation is not visualized.  5. The inferior vena cava is normal in size with greater than 50% respiratory  variability, suggesting right atrial pressure of 3 mmHg. FINDINGS  Left Ventricle: Left ventricular ejection fraction, by estimation, is 60 to 65%. Left ventricular ejection fraction by PLAX is 69 %. Left ventricular ejection fraction  by 2D MOD biplane is 64.2 %. The left ventricle has normal function. Left ventricular  endocardial border not optimally defined to evaluate regional wall motion. The left ventricular internal cavity size was normal in size. There is no left ventricular hypertrophy. Left ventricular diastolic parameters are consistent with Grade I diastolic dysfunction (impaired relaxation). Right Ventricle: The right ventricular size is normal. No increase in right ventricular wall thickness. Right ventricular systolic function is normal. Left Atrium: Left atrial size was normal in size. Right Atrium: Right atrial size was normal in size. Pericardium: There is no evidence of pericardial effusion. Mitral Valve: The mitral valve is normal in structure. Mild mitral valve regurgitation. Tricuspid Valve: The tricuspid valve is normal in structure. Tricuspid valve regurgitation is mild. Aortic Valve: The aortic valve is tricuspid. Aortic valve regurgitation is not visualized. Aortic valve peak gradient measures 3.1 mmHg. Pulmonic Valve: The pulmonic valve was not well visualized. Pulmonic valve regurgitation is trivial. Aorta: The aortic root is normal in size and structure. Venous: The inferior vena cava is normal in size with greater than 50% respiratory variability, suggesting right atrial pressure of 3 mmHg. IAS/Shunts: No atrial level shunt detected by color flow Doppler.  LEFT VENTRICLE PLAX 2D                        Biplane EF (MOD) LV EF:         Left            LV Biplane EF:   Left                ventricular                      ventricular                ejection                         ejection                fraction by                      fraction by                PLAX is 69                        2D MOD                %.                               biplane is LVIDd:         4.14 cm                          64.2 %. LVIDs:         2.56 cm LV PW:         1.14 cm         Diastology LV IVS:        0.88 cm         LV e' medial:    5.51 cm/s LVOT diam:     2.50 cm         LV E/e' medial:  8.7 LVOT Area:     4.91 cm  LV e' lateral:   6.77 cm/s                                LV E/e' lateral: 7.1  LV Volumes (MOD) LV vol d, MOD    65.5 ml A2C: LV vol d, MOD    64.4 ml A4C: LV vol s, MOD    24.5 ml A2C: LV vol s, MOD    22.7 ml A4C: LV SV MOD A2C:   41.0 ml LV SV MOD A4C:   64.4 ml LV SV MOD BP:    41.6 ml RIGHT VENTRICLE            IVC RV Basal diam:  2.95 cm    IVC diam: 1.51 cm RV S prime:     9.67 cm/s LEFT ATRIUM             Index        RIGHT ATRIUM           Index LA diam:        2.70 cm 1.56 cm/m   RA Area:     15.20 cm LA Vol (A2C):   40.7 ml 23.57 ml/m  RA Volume:   37.40 ml  21.66 ml/m LA Vol (A4C):   27.5 ml 15.93 ml/m LA Biplane Vol: 35.3 ml 20.45 ml/m  AORTIC VALVE AV Area (Vmax): 4.46 cm AV Vmax:        88.60 cm/s AV Peak Grad:   3.1 mmHg LVOT Vmax:      80.50 cm/s  AORTA Ao Root diam: 2.70 cm Ao Asc diam:  2.70 cm MITRAL VALVE MV Area (PHT): 5.09 cm    SHUNTS MV Decel Time: 149 msec    Systemic Diam: 2.50 cm MV E velocity: 48.00 cm/s MV A velocity: 61.10 cm/s MV E/A ratio:  0.79 Redell Cave MD Electronically signed by Redell Cave MD Signature Date/Time: 07/21/2024/4:31:38 PM    Final    DG Chest Port 1 View Result Date: 07/21/2024 EXAM: 1 VIEW(S) XRAY OF THE CHEST 07/21/2024 02:25:22 AM COMPARISON: October 05 2021 . CLINICAL HISTORY: Atrial fibrillation with RVR (HCC) [id number]. PER ER NOTE; Pt in via POV, complaints of intermittent heart palpitations x 1 day. Per daughter, her HR has been high and BP low all day, states she has a log of it. Reports hx of Afibb; denies any chest pain. A/Ox4, NAD noted at this time. EKG shows Afibb RVR w/ hypotension. CHANGE TO PORTABLE  PER DR FLOY FINDINGS: LUNGS AND PLEURA: No focal pulmonary opacity. No pulmonary edema. No pleural effusion. No pneumothorax. HEART AND MEDIASTINUM: No acute abnormality of the cardiac and mediastinal silhouettes. BONES AND SOFT TISSUES: No acute osseous abnormality. IMPRESSION: 1. No acute abnormalities. Electronically signed by: Norman Gatlin MD 07/21/2024 02:35 AM EDT RP Workstation: [id number]      Discharge Exam: Vitals:   07/25/24 0833 07/25/24 1203  BP: (!) 100/56 (!) 96/58  Pulse: 76 88  Resp: 16 16  Temp: (!) 97.5 F (36.4 C) 98.2 F (36.8 C)  SpO2: 97% 97%   Vitals:   07/24/24 2340 07/25/24 0341 07/25/24 0833 07/25/24 1203  BP: 93/60 (!) 96/56 (!) 100/56 (!) 96/58  Pulse: 75 79 76 88  Resp: 20 19 16 16   Temp: 97.9 F (36.6 C) (!) 97.4 F (36.3 C) (!) 97.5 F (36.4 C) 98.2 F (36.8 C)  TempSrc: Oral Oral    SpO2: 97% 97% 97%  97%  Weight:      Height:        Constitutional:  Normal appearance. Non toxic-appearing.  HENT: Head Normocephalic and atraumatic.  Mucous membranes are moist.  Eyes:  Extraocular intact. Conjunctivae normal.  Cardiovascular: Rate and Rhythm: Normal rate and regular rhythm.  Pulmonary: Non labored, symmetric rise of chest wall.  Skin: warm and dry. not jaundiced.  Neurological: No focal deficit present. alert. Oriented.  Psychiatric: Mood and Affect congruent.    The results of significant diagnostics from this hospitalization (including imaging, microbiology, ancillary and laboratory) are listed below for reference.     Microbiology: No results found for this or any previous visit (from the past 240 hours).   Labs: BNP (last 3 results) No results for input(s): BNP in the last 8760 hours. Basic Metabolic Panel: Recent Labs  Lab 07/21/24 0153 07/23/24 0958 07/24/24 0531  NA 138 141 142  K 3.7 3.5 4.0  CL 102 104 108  CO2 22 27 26   GLUCOSE 118* 136* 93  BUN 11 10 10   CREATININE 0.52 0.59 0.48  CALCIUM  8.9 8.8* 8.7*   MG  --   --  2.0  PHOS  --   --  3.8   Liver Function Tests: Recent Labs  Lab 07/24/24 0531  ALBUMIN 2.6*   No results for input(s): LIPASE, AMYLASE in the last 168 hours. No results for input(s): AMMONIA in the last 168 hours. CBC: Recent Labs  Lab 07/21/24 0153 07/23/24 0958 07/24/24 0531  WBC 6.0 5.4 5.0  NEUTROABS  --  3.8 2.8  HGB 15.0 13.7 12.8  HCT 43.1 40.2 37.6  MCV 90.9 92.2 93.1  PLT 313 253 243   Cardiac Enzymes: No results for input(s): CKTOTAL, CKMB, CKMBINDEX, TROPONINI in the last 168 hours. BNP: Invalid input(s): POCBNP CBG: No results for input(s): GLUCAP in the last 168 hours. D-Dimer No results for input(s): DDIMER in the last 72 hours. Hgb A1c No results for input(s): HGBA1C in the last 72 hours. Lipid Profile No results for input(s): CHOL, HDL, LDLCALC, TRIG, CHOLHDL, LDLDIRECT in the last 72 hours. Thyroid  function studies No results for input(s): TSH, T4TOTAL, T3FREE, THYROIDAB in the last 72 hours.  Invalid input(s): FREET3 Anemia work up No results for input(s): VITAMINB12, FOLATE, FERRITIN, TIBC, IRON, RETICCTPCT in the last 72 hours. Urinalysis    Component Value Date/Time   COLORURINE YELLOW (A) 07/12/2024 0702   APPEARANCEUR CLEAR (A) 07/12/2024 0702   LABSPEC 1.010 07/12/2024 0702   LABSPEC >=1.030 01/04/2014 0850   PHURINE 6.0 07/12/2024 0702   GLUCOSEU NEGATIVE 07/12/2024 0702   GLUCOSEU NEGATIVE 02/25/2021 0811   HGBUR NEGATIVE 07/12/2024 0702   BILIRUBINUR NEGATIVE 07/12/2024 0702   KETONESUR 5 (A) 07/12/2024 0702   PROTEINUR NEGATIVE 07/12/2024 0702   UROBILINOGEN 0.2 02/25/2021 0811   NITRITE NEGATIVE 07/12/2024 0702   LEUKOCYTESUR NEGATIVE 07/12/2024 0702   Sepsis Labs Recent Labs  Lab 07/21/24 0153 07/23/24 0958 07/24/24 0531  WBC 6.0 5.4 5.0   Microbiology No results found for this or any previous visit (from the past 240 hours).   Time coordinating  discharge: 32 min   SIGNED: Kindrick Lankford, DO [facility] 07/25/2024, 1:19 PM Pager   If 7PM-7AM, please contact night-coverage

## 2024-07-25 NOTE — Progress Notes (Signed)
 Mobility Specialist - Progress Note  Pre-mobility: HR-79, BP-96/65, SpO2-97%  During mobility: HR-80, BP-92/50, SpO2-97%  Post-mobility: HR-105, BP-103/71, SPO2-97%   07/25/24 1000  Mobility  Activity Ambulated with assistance;Stood at bedside  Level of Assistance Contact guard assist, steadying assist  Assistive Device Front wheel walker  Distance Ambulated (ft) 170 ft  Range of Motion/Exercises Active Assistive  Activity Response Tolerated well  Mobility visit 1 Mobility  Mobility Specialist Start Time (ACUTE ONLY) [id number]  Mobility Specialist Stop Time (ACUTE ONLY) 0950  Mobility Specialist Time Calculation (min) (ACUTE ONLY) 34 min   Pt was supine in bed upon entry. Pt agreed to mobility. Pt was able to EOB independently. Pt was able to STS independently with 2WW and cues for orientation. Pt was on an IV during activity. Vitals were taken as a precaution due to Afib. Pt was able to ambulate well with 2 WW and cues for confidence. Vitals taken in supine position(Pre), STS vitals (During), and post vitals after activity. Pt was returned back to room in bed with needs in reach and alarm was turned on.  Clem Rodes Mobility Specialist 07/25/24, 10:25 AM

## 2024-07-25 NOTE — TOC Transition Note (Signed)
 Transition of Care [facility]) - Discharge Note   Patient Details  Name: Allison [name] MRN: [id number] Date of Birth: 03/14/1951  [facility] CM/SW Contact:  Lauraine JAYSON Carpen, LCSW Phone Number: 07/25/2024, 1:20 PM   Clinical Narrative:  Patient has orders to discharge home today. Left message for [facility] liaison to notify. No further concerns. CSW signing off.   Final next level of care: Home w Home Health Services Barriers to Discharge: Barriers Resolved   Patient Goals and CMS Choice     Choice offered to / list presented to : Adult Children      Discharge Placement                Patient to be transferred to facility by: Daughter Name of family member notified: Daphne (Daughter) Patient and family notified of of transfer: 07/25/24  Discharge Plan and Services Additional resources added to the After Visit Summary for                            [facility] Arranged: PT, OT [facility] Agency: [facility] Date [facility] Agency Contacted: 07/25/24 Time HH Agency Contacted: 1431 Representative spoke with at [facility] Agency: Daphne  Social Drivers of Health (SDOH) Interventions SDOH Screenings   Food Insecurity: No Food Insecurity (07/21/2024)  Housing: Low Risk  (07/21/2024)  Transportation Needs: No Transportation Needs (07/21/2024)  Utilities: Not At Risk (07/21/2024)  Depression (PHQ2-9): Low Risk  (02/20/2023)  Financial Resource Strain: Low Risk  (02/20/2023)  Physical Activity: Sufficiently Active (02/20/2023)  Social Connections: Socially Isolated (07/22/2024)  Stress: No Stress Concern Present (02/20/2023)  Tobacco Use: Low Risk  (07/21/2024)     Readmission Risk Interventions     No data to display

## 2024-07-25 NOTE — Progress Notes (Signed)
  Patient Name: Allison [name] Date of Encounter: 07/25/2024  Primary Cardiologist: Evalene Lunger, MD Electrophysiologist: None  Interval Summary   NAEON.  No cute complaints, no palpitations. She is not aware whether she is in sinus or AFib currently.    Vital Signs    Vitals:   07/24/24 1656 07/24/24 1952 07/24/24 2340 07/25/24 0341  BP: 111/74 103/67 93/60 (!) 96/56  Pulse: 96 91 75 79  Resp:  19 20 19   Temp: 98 F (36.7 C) (!) 97.5 F (36.4 C) 97.9 F (36.6 C) (!) 97.4 F (36.3 C)  TempSrc:  Oral Oral Oral  SpO2: 97% 97% 97% 97%  Weight:      Height:        Intake/Output Summary (Last 24 hours) at 07/25/2024 0818 Last data filed at 07/25/2024 0600 Gross per 24 hour  Intake 1087.32 ml  Output --  Net 1087.32 ml   Filed Weights   07/21/24 0146  Weight: 61 kg    Physical Exam    GEN- NAD, Alert and oriented  Lungs- Clear to ausculation bilaterally, normal work of breathing Cardiac- Regular rate and rhythm, no murmurs, rubs or gallops GI- soft, NT, ND, + BS Extremities- no clubbing or cyanosis. No edema  Telemetry    AFib until ~2000, then converted to sinus 60-70s. Brief AFib episode overnight (personally reviewed)  Hospital Course    Allison [name] is a 73 y.o. female with PMH of  parox AFib, PAC, PVC, HTN, dementia admitted for palpitations, found to be in AFib w RVR.  Assessment & Plan    #) persis afib #) hypercoag d/t afib #) borderline BP Initiated amio gtt yesterday with improved ventricular rates, converted to sinus rhythm with brief AFib episode overnight Tolerating amio well Will convert to PO amio this morning - 200mg  BID x 2 weeks, then 200mg  daily Continue 20mg  xarelto  for stroke ppx Continue to hold BB at discharge Keep K > 4, mag > 2     [facility] will sign off.   The patient is ready for discharge today from a cardiac standpoint. Medication Recommendations:  stop metoprolol . Start 200mg  amiodarone BID x 2  weeks, then reduce to 200mg  daily Other recommendations (labs, testing, etc):  none Follow up as an outpatient:  scheduled For questions or updates, please contact  [facility] Please consult [email or web address] for contact info under     Signed, Sora Vrooman, NP  07/25/2024, 8:18 AM

## 2024-07-29 ENCOUNTER — Telehealth: Payer: Self-pay

## 2024-07-29 DIAGNOSIS — I959 Hypotension, unspecified: Secondary | ICD-10-CM | POA: Diagnosis not present

## 2024-07-29 DIAGNOSIS — F03A0 Unspecified dementia, mild, without behavioral disturbance, psychotic disturbance, mood disturbance, and anxiety: Secondary | ICD-10-CM | POA: Diagnosis not present

## 2024-07-29 DIAGNOSIS — I083 Combined rheumatic disorders of mitral, aortic and tricuspid valves: Secondary | ICD-10-CM | POA: Diagnosis not present

## 2024-07-29 DIAGNOSIS — E7800 Pure hypercholesterolemia, unspecified: Secondary | ICD-10-CM | POA: Diagnosis not present

## 2024-07-29 DIAGNOSIS — I10 Essential (primary) hypertension: Secondary | ICD-10-CM | POA: Diagnosis not present

## 2024-07-29 DIAGNOSIS — G43909 Migraine, unspecified, not intractable, without status migrainosus: Secondary | ICD-10-CM | POA: Diagnosis not present

## 2024-07-29 DIAGNOSIS — E860 Dehydration: Secondary | ICD-10-CM | POA: Diagnosis not present

## 2024-07-29 DIAGNOSIS — I4891 Unspecified atrial fibrillation: Secondary | ICD-10-CM | POA: Diagnosis not present

## 2024-07-29 DIAGNOSIS — I4892 Unspecified atrial flutter: Secondary | ICD-10-CM | POA: Diagnosis not present

## 2024-07-29 NOTE — Telephone Encounter (Signed)
 Per our discussion, please call Allison [name] and notify her to call Dr  Jamal office and see what medication he would recommend.

## 2024-07-29 NOTE — Telephone Encounter (Signed)
 LM for [employer]

## 2024-07-29 NOTE — Telephone Encounter (Signed)
 Patients daughter is aware

## 2024-07-29 NOTE — Telephone Encounter (Signed)
 OK for PT

## 2024-07-29 NOTE — Telephone Encounter (Signed)
 Ok

## 2024-07-29 NOTE — Telephone Encounter (Signed)
 Copied from CRM #[id number]. Topic: Clinical - Home Health Verbal Orders >> Jul 29, 2024  1:50 PM Alfonso HERO wrote: Caller/Agency: [facility] Callback Number: [phone] Service Requested: Physical Therapy Frequency: 1 week 7 Any new concerns about the patient? No

## 2024-07-31 NOTE — Telephone Encounter (Signed)
 Verbal orders given to Soni at [facility].

## 2024-08-01 DIAGNOSIS — F03A0 Unspecified dementia, mild, without behavioral disturbance, psychotic disturbance, mood disturbance, and anxiety: Secondary | ICD-10-CM | POA: Diagnosis not present

## 2024-08-07 ENCOUNTER — Ambulatory Visit: Admitting: Podiatry

## 2024-08-07 VITALS — Ht 68.0 in | Wt 134.5 lb

## 2024-08-07 DIAGNOSIS — L602 Onychogryphosis: Secondary | ICD-10-CM | POA: Diagnosis not present

## 2024-08-08 ENCOUNTER — Encounter: Payer: Self-pay | Admitting: Medical

## 2024-08-08 ENCOUNTER — Ambulatory Visit: Attending: Medical | Admitting: Medical

## 2024-08-08 ENCOUNTER — Ambulatory Visit: Admitting: Medical

## 2024-08-08 VITALS — BP 106/68 | HR 73 | Ht 68.0 in | Wt 131.0 lb

## 2024-08-08 DIAGNOSIS — I10 Essential (primary) hypertension: Secondary | ICD-10-CM

## 2024-08-08 DIAGNOSIS — I480 Paroxysmal atrial fibrillation: Secondary | ICD-10-CM | POA: Diagnosis not present

## 2024-08-08 DIAGNOSIS — I4819 Other persistent atrial fibrillation: Secondary | ICD-10-CM

## 2024-08-08 DIAGNOSIS — R7989 Other specified abnormal findings of blood chemistry: Secondary | ICD-10-CM | POA: Diagnosis not present

## 2024-08-08 DIAGNOSIS — I5032 Chronic diastolic (congestive) heart failure: Secondary | ICD-10-CM

## 2024-08-08 DIAGNOSIS — Z8639 Personal history of other endocrine, nutritional and metabolic disease: Secondary | ICD-10-CM

## 2024-08-08 MED ORDER — AMIODARONE HCL 100 MG PO TABS: 100.0000 mg | ORAL_TABLET | Freq: Every day | ORAL | 3 refills | Status: AC

## 2024-08-08 NOTE — Patient Instructions (Signed)
 Medication Instructions:  Your physician recommends the following medication changes.  DECREASE: Amiodarone to 100 mg by mouth daily   *If you need a refill on your cardiac medications before your next appointment, please call your pharmacy*  Lab Work: Your provider would like for you to have following labs drawn today BMP.     Testing/Procedures: No test ordered today   Follow-Up: At [facility], you and your health needs are our priority.  As part of our continuing mission to provide you with exceptional heart care, our providers are all part of one team.  This team includes your primary Cardiologist (physician) and Advanced Practice Providers or APPs (Physician Assistants and Nurse Practitioners) who all work together to provide you with the care you need, when you need it.  Your next appointment:   4 month(s)  Provider:   Timothy Gollan, MD or Cadence Franchester, PA-C

## 2024-08-08 NOTE — Progress Notes (Signed)
 Cardiology Office Note   Date:  08/08/2024  ID:  VASHON ARCH, DOB 1951/09/13, MRN [id number] PCP: Glendia Shad, MD  [facility] Providers Cardiologist:  Evalene Lunger, MD   History of Present Illness Allison [name] is a 73 y.o. female with a h/o Afib, HTN, dementia who is being seen for hospital follow-up.   Echo in 2020 showed LVEF 55%, normal RV function. Patient has a h/o Pafib on Xarelto  20mg  daily.  The patient was admitted 9/28-10/2 for with Afib RVR, hypotension, elevated troponin and thrombophlebitis left arm (at prior IV site). She was started on IV Cardizem  with spontaneous conversion to normal sinus rhythm while in the ER.  Patient was started on flecainide and Lopressor .  IV Cardizem  was changed to oral Cardizem .  Patient subsequently converted back into A-fib RVR and EP was consulted.  Flecainide was stopped and amiodarone was started.  Today, EKG shows SR with Qtc . She is supposed to decrease amiodarone to 200mg  daily. She denies chest pain, SOB, lower leg edema. She can feel heart beat more at night.   Studies Reviewed EKG Interpretation Date/Time:  Thursday August 08 2024 14:27:06 EDT Ventricular Rate:  73 PR Interval:  146 QRS Duration:  92 QT Interval:  554 QTC Calculation: 610 R Axis:   -51  Text Interpretation:  Critical Test Result: Long QTc Normal sinus rhythm Left axis deviation Incomplete right bundle branch block Nonspecific ST abnormality Prolonged QT When compared with ECG of 21-Jul-2024 02:06, PREVIOUS ECG IS PRESENT Confirmed by Franchester, Kenya Kook ([id number]) on 08/08/2024 9:36:35 PM    2D echo 07/21/2024: 1. Left ventricular ejection fraction, by estimation, is 60 to 65%. Left  ventricular ejection fraction by 2D MOD biplane is 64.2 %. Left  ventricular ejection fraction by PLAX is 69 %. The left ventricle has  normal function. Left ventricular endocardial  border not optimally defined to evaluate regional wall motion. Left   ventricular diastolic parameters are consistent with Grade I diastolic  dysfunction (impaired relaxation).   2. Right ventricular systolic function is normal. The right ventricular  size is normal.   3. The mitral valve is normal in structure. Mild mitral valve  regurgitation.   4. The aortic valve is tricuspid. Aortic valve regurgitation is not  visualized.   5. The inferior vena cava is normal in size with greater than 50%  respiratory variability, suggesting right atrial pressure of 3 mmHg.   Echo 2020  1. The left ventricle has normal systolic function, with an ejection  fraction of 55-60%. The cavity size was normal. Left ventricular diastolic  parameters were normal.   2. The right ventricle has normal systolic function. The cavity was  normal. There is no increase in right ventricular wall thickness.   3. The mitral valve is grossly normal.   4. The tricuspid valve is grossly normal.   5. No stenosis of the aortic valve.      Physical Exam VS:  BP 106/68 (BP Location: Left Arm, Patient Position: Sitting, Cuff Size: Normal)   Pulse 73   Ht 5' 8 (1.727 m)   Wt 131 lb (59.4 kg)   SpO2 95%   BMI 19.92 kg/m        Wt Readings from Last 3 Encounters:  08/08/24 131 lb (59.4 kg)  08/07/24 134 lb 7.7 oz (61 kg)  07/21/24 134 lb 7.7 oz (61 kg)    GEN: Well nourished, well developed in no acute distress NECK: No JVD; No carotid bruits CARDIAC:  RRR, no murmurs, rubs, gallops RESPIRATORY:  Clear to auscultation without rales, wheezing or rhonchi  ABDOMEN: Soft, non-tender, non-distended EXTREMITIES:  No edema; No deformity   ASSESSMENT AND PLAN  Persistent Afib Recent hospitalization for rapid A-fib started on IV cardizem  with conversion to NSR.  Started on flecainide, but she went back into A-fib and started on amiodarone. Echo showed normal EF. EKG today shows sinus rhythm, 73 bpm, QTc 610 MS.  Patient is supposed to change from 200 mg twice daily down to 200 mg daily.  I  recommended we decrease to 100 mg daily.  She has follow-up with EP at the end of the month.  Continue Xarelto  20 mg daily.  History of hypokalemia Daughter is requesting BMP, I will order this.  Chronic diastolic dysfunction Echo during hospitalization send normal EF with grade 1 diastolic dysfunction.  Recommended low-salt diet, compression socks, leg elevation.  Mildly elevated troponin Troponin elevated to 33 in the setting of rapid A-fib.  Patient had no chest pain.  We had a discussion around ischemic evaluation, and family would like to defer at this time given no significant CAD history with no symptoms, and overall poor function.  Continue Crestor .       Dispo: follow-up in 4 months  Signed, Kellon Chalk VEAR Fishman, PA-C

## 2024-08-08 NOTE — Progress Notes (Signed)
  Subjective:  Patient ID: Allison [name], female    DOB: 06-09-1951,  MRN: [id number]  Chief Complaint  Patient presents with   Nail Problem    Rm 2 NP - Painful Toenails Evaluation/Trim    73 y.o. female presents with the above complaint. History confirmed with patient.  Her nails are elongated and she has difficulty trimming them  Objective:  Physical Exam: warm, good capillary refill, no trophic changes or ulcerative lesions, normal DP and PT pulses, normal sensory exam, and elongated toenails without dystrophy  Assessment:   1. Long toenail      Plan:  Patient was evaluated and treated and all questions answered.  Nails trimmed in length with a sharp nail nipper.  We discussed covered versus noncovered routine footcare.  ABN signed.  Follow-up in 3 months if she would like further nail trims  Return for nail care.

## 2024-08-09 ENCOUNTER — Encounter: Payer: Self-pay | Admitting: Internal Medicine

## 2024-08-09 ENCOUNTER — Ambulatory Visit: Payer: Self-pay | Admitting: Medical

## 2024-08-09 DIAGNOSIS — Z79899 Other long term (current) drug therapy: Secondary | ICD-10-CM

## 2024-08-09 LAB — BASIC METABOLIC PANEL WITH GFR
BUN/Creatinine Ratio: 10 — ABNORMAL LOW (ref 12–28)
BUN: 7 mg/dL — ABNORMAL LOW (ref 8–27)
CO2: 28 mmol/L (ref 20–29)
Calcium: 10.2 mg/dL (ref 8.7–10.3)
Chloride: 97 mmol/L (ref 96–106)
Creatinine, Ser: 0.73 mg/dL (ref 0.57–1.00)
Glucose: 103 mg/dL — ABNORMAL HIGH (ref 70–99)
Potassium: 3.3 mmol/L — ABNORMAL LOW (ref 3.5–5.2)
Sodium: 143 mmol/L (ref 134–144)
eGFR: 88 mL/min/1.73 (ref >59)

## 2024-08-09 MED ORDER — POTASSIUM CHLORIDE ER 10 MEQ PO TBCR: 10.0000 meq | EXTENDED_RELEASE_TABLET | Freq: Every day | ORAL | 1 refills | Status: AC

## 2024-08-09 NOTE — Telephone Encounter (Signed)
 Copied from CRM #[id number]. Topic: General - Other >> Aug 09, 2024  8:33 AM Thersia BROCKS wrote: Reason for CRM: Patient Daughter , Daphne Franks  called in wanting to speak with Dr.Scott or her nurse regarding patient, would like a callback  Would like to know if she needs to be put on potassium  [phone]

## 2024-08-09 NOTE — Telephone Encounter (Signed)
 Reviewed. It appears that cardiology ordered the met b. Please confirm if they have heard from cardiology about results. I do not mind following (or following up on this), but I don't want her to get tow different sets of instructions. Please provide information - foods with increased potassium. Please have her contact cardiology and let me know if we need to address.

## 2024-08-10 ENCOUNTER — Telehealth: Payer: Self-pay | Admitting: Physician Assistant

## 2024-08-10 NOTE — Telephone Encounter (Signed)
 73 year old female with history of atrial fibrillation.  She was just admitted to [facility] at the end of September with rapid A-fib.  She was previously on flecainide.  This was discontinued and she was placed on amiodarone.  She was seen in the office recently and was in sinus rhythm.  QT was prolonged, >600.  Her amiodarone was reduced to 100 mg daily.  Her daughter Patt Morgans, [place] on file) called answering service today.  The patient is noted to be back in atrial fibrillation based upon her blood pressure cuff.  Her heart rate is 98-110.  She feels some palpitations but otherwise feels well.  She is not having chest pain, shortness of breath or near syncope.  Blood pressure usually runs low, systolic in the 90s.  She previously was on metoprolol  succinate 75 mg daily.  This was stopped during her recent hospitalization.  PLAN: Continue current medications. With recent prolonged QT, would not increase or take extra amiodarone. Hesitant to give metoprolol  given her low blood pressures. If she starts to feel poorly or develops rapid rates, she should go to the emergency room. If unsure, she is to call back.  We could give her 1 dose of metoprolol  with some salty food to prevent low blood pressure. Will arrange follow-up in the office Monday 10/20. Glendia Ferrier, PA-C 08/10/2024

## 2024-08-12 NOTE — Telephone Encounter (Signed)
 Daughter made aware that per Cadence ok to keep appointment on 08/22/24. Daughter also made aware to contact our office for any new or worsening symptoms. Daughter verbalized understanding.

## 2024-08-12 NOTE — Telephone Encounter (Signed)
 Called the patient's daughter regarding scheduling an appointment. The daughter stated that she has since purchased a new blood pressure machine and noted the patient's blood pressure has been within normal limits, with a heart rate in the 70s. She reported that over the weekend, she saw a reading as high as 115, but it has since leveled off. She inquired if they can keep the scheduled appointment next week on 08/22/24.  [place], Cadence H, PA-C to Me (Selected Message)    08/12/24  8:06 AM Pt needs to see EP ASAP please

## 2024-08-14 DIAGNOSIS — E860 Dehydration: Secondary | ICD-10-CM | POA: Diagnosis not present

## 2024-08-20 ENCOUNTER — Telehealth: Payer: Self-pay

## 2024-08-20 NOTE — Telephone Encounter (Signed)
 Copied from CRM [id number]. Topic: Clinical - Home Health Verbal Orders >> Aug 20, 2024  8:37 AM Viola FALCON wrote: Corean from [facility] seen patient 10/9 and is calling to follow up on orders that were faxed for OT 2 weeks ago. Her callback number is [phone]

## 2024-08-21 DIAGNOSIS — H25013 Cortical age-related cataract, bilateral: Secondary | ICD-10-CM | POA: Diagnosis not present

## 2024-08-21 DIAGNOSIS — H2513 Age-related nuclear cataract, bilateral: Secondary | ICD-10-CM | POA: Diagnosis not present

## 2024-08-21 NOTE — Progress Notes (Unsigned)
 Electrophysiology Clinic Note    Date:  08/22/2024  Patient ID:  Allison [name], Allison [name] 1951-06-07, MRN [id number] PCP:  Glendia Shad, MD  Cardiologist:  Evalene Lunger, MD  Electrophysiologist:  OLE ONEIDA HOLTS, MD  Electrophysiology APP:  Agustina Witzke, NP     Discussed the use of AI scribe software for clinical note transcription with the patient, who gave verbal consent to proceed.   Patient Profile    Chief Complaint: AFib  History of Present Illness: Allison [name] is a 73 y.o. female with PMH notable for parox AFib, PAC, PVC, HTN, dementia ; seen today for OLE ONEIDA HOLTS, MD for post hospital follow up.    Admitted 9/28 - 10/2 with low BP and palpitations, found to be in Afib w RVR. Initially converted to sinus with dilt gtt, but had recurrence of AFib and EP was consulted. Amio gtt was started and she converted to sinus rhtyhm. She was discharged on PO amiodarone to complete her load.   She saw PA Franchester 10/16 for hospital follow-up where QTC was prolonged to >680ms and amiodarone adjusted to 100mg  daily.   Patient's daughter then called on-call provider 10/18 with Afib via BP cuff, mild palpitations with low BP in 90s systolic.   On follow-up today, they have since purchased new BP cuff and readings are much more in line with clinic readings today. She has had 2 AF episodes, both lasted about 30-60 minutes and were accompanied with palpitations and some anxiety. No profound chest pain, SOB, presyncope with episodes.  She continues to take xarelto  daily, no bleeding concerns.  Continues to take 100mg  amiodarone daily.   Patient's daughter joins for visit.    Arrhythmia/Device History Amiodarone    ROS:  Please see the history of present illness. All other systems are reviewed and otherwise negative.    Physical Exam    VS:  BP 90/60 (BP Location: Left Arm, Patient Position: Sitting, Cuff Size: Normal)   Pulse 81   Ht 5' 8 (1.727 m)   Wt 125  lb 4 oz (56.8 kg)   SpO2 97%   BMI 19.04 kg/m  BMI: Body mass index is 19.04 kg/m.           Wt Readings from Last 3 Encounters:  08/22/24 125 lb 4 oz (56.8 kg)  08/08/24 131 lb (59.4 kg)  08/07/24 134 lb 7.7 oz (61 kg)     GEN- The patient is well appearing, alert and oriented x 3 today.   Lungs- Clear to ausculation bilaterally, normal work of breathing.  Heart- Regular rate and rhythm, no murmurs, rubs or gallops Extremities- No peripheral edema, warm, dry   Studies Reviewed   Previous EP, cardiology notes.    EKG is ordered. Personal review of EKG from today shows:    EKG Interpretation Date/Time:  Thursday August 22 2024 13:40:06 EDT Ventricular Rate:  81 PR Interval:  142 QRS Duration:  86 QT Interval:  404 QTC Calculation: 469 R Axis:   -51  Text Interpretation: Normal sinus rhythm Left axis deviation Nonspecific ST abnormality Confirmed by Rajon Bisig [id number]) on 08/22/2024 1:47:21 PM    TTE, 07/21/2024  1. Left ventricular ejection fraction, by estimation, is 60 to 65%. Left ventricular ejection fraction by 2D MOD biplane is 64.2 %. Left ventricular ejection fraction by PLAX is 69 %. The left ventricle has normal function. Left ventricular endocardial border not optimally defined to evaluate regional wall motion. Left ventricular diastolic parameters are  consistent with Grade I diastolic dysfunction (impaired relaxation).   2. Right ventricular systolic function is normal. The right ventricular size is normal.   3. The mitral valve is normal in structure. Mild mitral valve regurgitation.   4. The aortic valve is tricuspid. Aortic valve regurgitation is not visualized.   5. The inferior vena cava is normal in size with greater than 50% respiratory variability, suggesting right atrial pressure of 3 mmHg.   TTE, 05/03/2019  1. The left ventricle has normal systolic function, with an ejection fraction of 55-60%. The cavity size was normal. Left ventricular  diastolic parameters were normal.   2. The right ventricle has normal systolic function. The cavity was normal. There is no increase in right ventricular wall thickness.   3. The mitral valve is grossly normal.   4. The tricuspid valve is grossly normal.   5. No stenosis of the aortic valve.    Assessment and Plan     #) persis AFib #) amiodarone monitoring Overall good AFib control, has had two paroxysmal episodes that lasted less than 1 hour, main symptom was palpitations EKG with improved QTC Continue 100mg  amiodarone Update CMP, mag, thyroid  labs today We briefly discuss AF ablation procedure. At this time, will continue amiodarone and have patient see MD for further discussion and consideration   #) Hypercoag d/t persis afib CHA2DS2-VASc Score = at least 3 [CHF History: 0, HTN History: 1, Diabetes History: 0, Stroke History: 0, Vascular Disease History: 0, Age Score: 1, Gender Score: 1].  Therefore, the patient's annual risk of stroke is 3.2 %.    Stroke ppx - 20mg  xarelto  daily, appropriately dosed No bleeding concerns        Current medicines are reviewed at length with the patient today.   The patient does not have concerns regarding her medicines.  The following changes were made today:  none  Labs/ tests ordered today include:  Orders Placed This Encounter  Procedures   Comprehensive metabolic panel with GFR   Magnesium    TSH   T4, free   EKG 12-Lead     Disposition: Follow up with Dr. Kennyth or EP APP in 3 months   Signed, Airen Dales, NP  08/22/24  2:11 PM  Electrophysiology [facility]

## 2024-08-21 NOTE — Telephone Encounter (Signed)
 Lvm notifying orders not received

## 2024-08-21 NOTE — Telephone Encounter (Signed)
 Copied from CRM [id number]. Topic: Clinical - Home Health Verbal Orders >> Aug 21, 2024  9:27 AM Logan F wrote: Ellerie from [facility] is following up on orders faxed to office. These cannot not be done verbally as office need a hard copy with signature.   Occupational Therapy (order# [id number]) faxed on 10/10 and 10/24 Frequency: 1 week 1 for evaluation   Certification and plan of care [phone]) faxed on 10/10 and 10/24  Please call Jon back if there are any questions or concerns. [phone]

## 2024-08-22 ENCOUNTER — Encounter: Payer: Self-pay | Admitting: Cardiology

## 2024-08-22 ENCOUNTER — Ambulatory Visit: Attending: Cardiology | Admitting: Cardiology

## 2024-08-22 VITALS — BP 90/60 | HR 81 | Ht 68.0 in | Wt 125.2 lb

## 2024-08-22 DIAGNOSIS — Z79899 Other long term (current) drug therapy: Secondary | ICD-10-CM | POA: Diagnosis not present

## 2024-08-22 DIAGNOSIS — Z5181 Encounter for therapeutic drug level monitoring: Secondary | ICD-10-CM | POA: Diagnosis not present

## 2024-08-22 DIAGNOSIS — I4819 Other persistent atrial fibrillation: Secondary | ICD-10-CM

## 2024-08-22 DIAGNOSIS — D6869 Other thrombophilia: Secondary | ICD-10-CM

## 2024-08-22 NOTE — Patient Instructions (Signed)
 Medication Instructions:  Your physician recommends that you continue on your current medications as directed. Please refer to the Current Medication list given to you today.    *If you need a refill on your cardiac medications before your next appointment, please call your pharmacy*  Lab Work: Your provider would like for you to have following labs drawn today CMP, Mg, TSH, T4.     Testing/Procedures: No test ordered today   Follow-Up: At [facility], you and your health needs are our priority.  As part of our continuing mission to provide you with exceptional heart care, our providers are all part of one team.  This team includes your primary Cardiologist (physician) and Advanced Practice Providers or APPs (Physician Assistants and Nurse Practitioners) who all work together to provide you with the care you need, when you need it.  Your next appointment:   3 month(s)  Provider:   Dr. Kennyth

## 2024-08-23 ENCOUNTER — Ambulatory Visit: Payer: Self-pay | Admitting: Cardiology

## 2024-08-23 LAB — COMPREHENSIVE METABOLIC PANEL WITH GFR
ALT: 44 IU/L — ABNORMAL HIGH (ref 0–32)
AST: 53 IU/L — ABNORMAL HIGH (ref 0–40)
Albumin: 4.2 g/dL (ref 3.8–4.8)
Alkaline Phosphatase: 52 IU/L (ref 49–135)
BUN/Creatinine Ratio: 13 (ref 12–28)
BUN: 11 mg/dL (ref 8–27)
Bilirubin Total: 0.8 mg/dL (ref 0.0–1.2)
CO2: 24 mmol/L (ref 20–29)
Calcium: 9.9 mg/dL (ref 8.7–10.3)
Chloride: 99 mmol/L (ref 96–106)
Creatinine, Ser: 0.86 mg/dL (ref 0.57–1.00)
Globulin, Total: 2.3 g/dL (ref 1.5–4.5)
Glucose: 80 mg/dL (ref 70–99)
Potassium: 4.4 mmol/L (ref 3.5–5.2)
Sodium: 140 mmol/L (ref 134–144)
Total Protein: 6.5 g/dL (ref 6.0–8.5)
eGFR: 72 mL/min/1.73 (ref >59)

## 2024-08-23 LAB — TSH: TSH: 0.818 u[IU]/mL (ref 0.450–4.500)

## 2024-08-23 LAB — MAGNESIUM: Magnesium: 1.9 mg/dL (ref 1.6–2.3)

## 2024-08-23 LAB — T4, FREE: Free T4: 1.94 ng/dL — ABNORMAL HIGH (ref 0.82–1.77)

## 2024-08-27 ENCOUNTER — Encounter: Admitting: Internal Medicine

## 2024-08-27 DIAGNOSIS — I10 Essential (primary) hypertension: Secondary | ICD-10-CM | POA: Diagnosis not present

## 2024-08-27 DIAGNOSIS — E7800 Pure hypercholesterolemia, unspecified: Secondary | ICD-10-CM | POA: Diagnosis not present

## 2024-08-27 DIAGNOSIS — F03A0 Unspecified dementia, mild, without behavioral disturbance, psychotic disturbance, mood disturbance, and anxiety: Secondary | ICD-10-CM | POA: Diagnosis not present

## 2024-08-27 DIAGNOSIS — E860 Dehydration: Secondary | ICD-10-CM | POA: Diagnosis not present

## 2024-08-27 NOTE — Telephone Encounter (Signed)
 Received paperwork placed in folder

## 2024-08-28 DIAGNOSIS — G43909 Migraine, unspecified, not intractable, without status migrainosus: Secondary | ICD-10-CM | POA: Diagnosis not present

## 2024-08-28 DIAGNOSIS — I4891 Unspecified atrial fibrillation: Secondary | ICD-10-CM | POA: Diagnosis not present

## 2024-08-28 DIAGNOSIS — I4892 Unspecified atrial flutter: Secondary | ICD-10-CM | POA: Diagnosis not present

## 2024-08-28 DIAGNOSIS — Z7901 Long term (current) use of anticoagulants: Secondary | ICD-10-CM

## 2024-08-28 DIAGNOSIS — K219 Gastro-esophageal reflux disease without esophagitis: Secondary | ICD-10-CM

## 2024-08-28 DIAGNOSIS — E860 Dehydration: Secondary | ICD-10-CM | POA: Diagnosis not present

## 2024-08-28 DIAGNOSIS — E7800 Pure hypercholesterolemia, unspecified: Secondary | ICD-10-CM | POA: Diagnosis not present

## 2024-08-28 DIAGNOSIS — I083 Combined rheumatic disorders of mitral, aortic and tricuspid valves: Secondary | ICD-10-CM | POA: Diagnosis not present

## 2024-08-28 DIAGNOSIS — I959 Hypotension, unspecified: Secondary | ICD-10-CM | POA: Diagnosis not present

## 2024-08-28 DIAGNOSIS — L409 Psoriasis, unspecified: Secondary | ICD-10-CM

## 2024-08-28 DIAGNOSIS — F03A0 Unspecified dementia, mild, without behavioral disturbance, psychotic disturbance, mood disturbance, and anxiety: Secondary | ICD-10-CM | POA: Diagnosis not present

## 2024-08-28 DIAGNOSIS — I10 Essential (primary) hypertension: Secondary | ICD-10-CM

## 2024-08-28 NOTE — Telephone Encounter (Signed)
 Signed and placed in box.

## 2024-08-28 NOTE — Telephone Encounter (Signed)
Form faxed & sent to scan.

## 2024-09-05 DIAGNOSIS — E7800 Pure hypercholesterolemia, unspecified: Secondary | ICD-10-CM | POA: Diagnosis not present

## 2024-09-05 DIAGNOSIS — I083 Combined rheumatic disorders of mitral, aortic and tricuspid valves: Secondary | ICD-10-CM | POA: Diagnosis not present

## 2024-09-05 DIAGNOSIS — I4891 Unspecified atrial fibrillation: Secondary | ICD-10-CM | POA: Diagnosis not present

## 2024-09-05 DIAGNOSIS — I959 Hypotension, unspecified: Secondary | ICD-10-CM | POA: Diagnosis not present

## 2024-09-05 DIAGNOSIS — E860 Dehydration: Secondary | ICD-10-CM | POA: Diagnosis not present

## 2024-09-05 DIAGNOSIS — I10 Essential (primary) hypertension: Secondary | ICD-10-CM | POA: Diagnosis not present

## 2024-09-05 DIAGNOSIS — G43909 Migraine, unspecified, not intractable, without status migrainosus: Secondary | ICD-10-CM | POA: Diagnosis not present

## 2024-09-05 DIAGNOSIS — I4892 Unspecified atrial flutter: Secondary | ICD-10-CM | POA: Diagnosis not present

## 2024-09-10 DIAGNOSIS — G43909 Migraine, unspecified, not intractable, without status migrainosus: Secondary | ICD-10-CM | POA: Diagnosis not present

## 2024-09-10 DIAGNOSIS — I4892 Unspecified atrial flutter: Secondary | ICD-10-CM | POA: Diagnosis not present

## 2024-09-10 DIAGNOSIS — E860 Dehydration: Secondary | ICD-10-CM | POA: Diagnosis not present

## 2024-09-10 DIAGNOSIS — I959 Hypotension, unspecified: Secondary | ICD-10-CM | POA: Diagnosis not present

## 2024-09-10 DIAGNOSIS — I4891 Unspecified atrial fibrillation: Secondary | ICD-10-CM | POA: Diagnosis not present

## 2024-09-10 DIAGNOSIS — I10 Essential (primary) hypertension: Secondary | ICD-10-CM | POA: Diagnosis not present

## 2024-09-10 DIAGNOSIS — I083 Combined rheumatic disorders of mitral, aortic and tricuspid valves: Secondary | ICD-10-CM | POA: Diagnosis not present

## 2024-09-10 DIAGNOSIS — F03A0 Unspecified dementia, mild, without behavioral disturbance, psychotic disturbance, mood disturbance, and anxiety: Secondary | ICD-10-CM | POA: Diagnosis not present

## 2024-09-23 ENCOUNTER — Ambulatory Visit: Admitting: Internal Medicine

## 2024-09-23 VITALS — BP 106/70 | HR 98 | Temp 97.8°F | Ht 68.0 in | Wt 117.0 lb

## 2024-09-23 DIAGNOSIS — R739 Hyperglycemia, unspecified: Secondary | ICD-10-CM

## 2024-09-23 DIAGNOSIS — Z23 Encounter for immunization: Secondary | ICD-10-CM

## 2024-09-23 DIAGNOSIS — R413 Other amnesia: Secondary | ICD-10-CM

## 2024-09-23 DIAGNOSIS — I10 Essential (primary) hypertension: Secondary | ICD-10-CM

## 2024-09-23 DIAGNOSIS — E7800 Pure hypercholesterolemia, unspecified: Secondary | ICD-10-CM | POA: Diagnosis not present

## 2024-09-23 DIAGNOSIS — R634 Abnormal weight loss: Secondary | ICD-10-CM

## 2024-09-23 DIAGNOSIS — M810 Age-related osteoporosis without current pathological fracture: Secondary | ICD-10-CM

## 2024-09-23 DIAGNOSIS — I480 Paroxysmal atrial fibrillation: Secondary | ICD-10-CM

## 2024-09-23 DIAGNOSIS — E041 Nontoxic single thyroid nodule: Secondary | ICD-10-CM

## 2024-09-23 LAB — HEPATIC FUNCTION PANEL
ALT: 24 U/L (ref 0–35)
AST: 35 U/L (ref 0–37)
Albumin: 4.1 g/dL (ref 3.5–5.2)
Alkaline Phosphatase: 47 U/L (ref 39–117)
Bilirubin, Direct: 0.2 mg/dL (ref 0.0–0.3)
Total Bilirubin: 0.9 mg/dL (ref 0.2–1.2)
Total Protein: 6.4 g/dL (ref 6.0–8.3)

## 2024-09-23 LAB — BASIC METABOLIC PANEL WITH GFR
BUN: 11 mg/dL (ref 6–23)
CO2: 33 meq/L — ABNORMAL HIGH (ref 19–32)
Calcium: 9.7 mg/dL (ref 8.4–10.5)
Chloride: 95 meq/L — ABNORMAL LOW (ref 96–112)
Creatinine, Ser: 0.91 mg/dL (ref 0.40–1.20)
GFR: 63.22 mL/min (ref >60.00)
Glucose, Bld: 95 mg/dL (ref 70–99)
Potassium: 3.6 meq/L (ref 3.5–5.1)
Sodium: 140 meq/L (ref 135–145)

## 2024-09-23 LAB — TSH: TSH: 0.65 u[IU]/mL (ref 0.35–5.50)

## 2024-09-23 LAB — CBC WITH DIFFERENTIAL/PLATELET
Basophils Absolute: 0.1 K/uL (ref 0.0–0.1)
Basophils Relative: 0.8 % (ref 0.0–3.0)
Eosinophils Absolute: 0.1 K/uL (ref 0.0–0.7)
Eosinophils Relative: 1.3 % (ref 0.0–5.0)
HCT: 47.8 % — ABNORMAL HIGH (ref 36.0–46.0)
Hemoglobin: 16 g/dL — ABNORMAL HIGH (ref 12.0–15.0)
Lymphocytes Relative: 16.5 % (ref 12.0–46.0)
Lymphs Abs: 1.1 K/uL (ref 0.7–4.0)
MCHC: 33.6 g/dL (ref 30.0–36.0)
MCV: 94.3 fl (ref 78.0–100.0)
Monocytes Absolute: 0.8 K/uL (ref 0.1–1.0)
Monocytes Relative: 11 % (ref 3.0–12.0)
Neutro Abs: 4.9 K/uL (ref 1.4–7.7)
Neutrophils Relative %: 70.4 % (ref 43.0–77.0)
Platelets: 174 K/uL (ref 150.0–400.0)
RBC: 5.06 Mil/uL (ref 3.87–5.11)
RDW: 15.4 % (ref 11.5–15.5)
WBC: 6.9 K/uL (ref 4.0–10.5)

## 2024-09-23 MED ORDER — ROSUVASTATIN CALCIUM 10 MG PO TABS: 10.0000 mg | ORAL_TABLET | Freq: Every day | ORAL | 0 refills | Status: AC

## 2024-09-23 MED ORDER — OMEPRAZOLE 20 MG PO CPDR: 20.0000 mg | DELAYED_RELEASE_CAPSULE | Freq: Every day | ORAL | 1 refills | Status: DC

## 2024-09-23 NOTE — Progress Notes (Signed)
 Subjective:    Patient ID: Allison [name], female    DOB: 03-Aug-1951, 73 y.o.   MRN: [id number]  Patient here for  Chief Complaint  Patient presents with   Medical Management of Chronic Issues    HPI Here for a scheduled follow up - follow up regarding afib, hypertension and hypercholesterolemia. She is accompanied by her daughter. History obtained from both of them. Had f/u with cardiology/EP 08/22/24. Admitted 9/28 - 10/2 with low BP and palpitations, found to be in Afib w RVR. Initially converted to sinus with dilt gtt, but had recurrence of AFib and EP was consulted. Amio gtt was started and she converted to sinus rhtyhm. She was discharged on PO amiodarone  to complete her load. She saw PA Franchester 10/16 for hospital follow-up where QTC was prolonged to >670ms and amiodarone  adjusted to 100mg  daily. 10/30 visit - EKG with improved QTC. Recommended to continue 100mg  amiodarone  and xarelto . Saw endocrinology 05/15/23 - thyroid  mass. Ultrasound thyroid  06/08/23 - 3.6cm solid nodule. Also left sided 1.0 cm nodule. Recommended FNA of the 3.6 cm mass. Ultrasound guided biopsy 07/05/23. Scheduled for mammogram 10/04/24. She is accompanied by her daughter. History obtained from both of them. Denies chest pain or sob. No abdominal pain. Discussed weight loss and the need to eat. Bowels stable.    Past Medical History:  Diagnosis Date   A-fib [facility])    Allergy    Anxiety    Cataract    GERD (gastroesophageal reflux disease)    Hypercholesterolemia    Hypertension    Migraine headache    Psoriasis    Past Surgical History:  Procedure Laterality Date   COLONOSCOPY WITH PROPOFOL  N/A 12/23/2016   Procedure: COLONOSCOPY WITH PROPOFOL ;  Surgeon: Gladis RAYMOND Mariner, MD;  Location: [facility] ENDOSCOPY;  Service: Endoscopy;  Laterality: N/A;   DILATION AND CURETTAGE OF UTERUS     history of abnormal bleeding   TUBAL LIGATION     Family History  Problem Relation Age of Onset   Asthma Father    Diabetes  Mother    CVA Mother    Psoriasis Mother    Breast cancer Maternal Grandmother    Breast cancer Paternal Grandmother    Colon cancer Maternal Uncle    Colon cancer Other        nephew   Diabetes Other        multiple relatives (both sides)   Stroke Brother        Light stroke   Diabetes Brother    Social History   Socioeconomic History   Marital status: Single    Spouse name: Not on file   Number of children: 1   Years of education: Not on file   Highest education level: 12th grade  Occupational History   Not on file  Tobacco Use   Smoking status: Never   Smokeless tobacco: Never  Vaping Use   Vaping status: Never Used  Substance and Sexual Activity   Alcohol use: No    Alcohol/week: 0.0 standard drinks of alcohol   Drug use: No   Sexual activity: Not on file  Other Topics Concern   Not on file  Social History Narrative   Not on file   Social Drivers of Health   Financial Resource Strain: Patient Declined (09/23/2024)   Overall Financial Resource Strain (CARDIA)    Difficulty of Paying Living Expenses: Patient declined  Food Insecurity: No Food Insecurity (09/23/2024)   Hunger Vital Sign    Worried  About Running Out of Food in the Last Year: Never true    Ran Out of Food in the Last Year: Never true  Transportation Needs: No Transportation Needs (09/23/2024)   PRAPARE - Administrator, Civil Service (Medical): No    Lack of Transportation (Non-Medical): No  Physical Activity: Inactive (09/23/2024)   Exercise Vital Sign    Days of Exercise per Week: 0 days    Minutes of Exercise per Session: Not on file  Stress: Stress Concern Present (09/23/2024)   [employer] of Occupational Health - Occupational Stress Questionnaire    Feeling of Stress: To some extent  Social Connections: Socially Isolated (09/23/2024)   Social Connection and Isolation Panel    Frequency of Communication with Friends and Family: More than three times a week    Frequency of  Social Gatherings with Friends and Family: More than three times a week    Attends Religious Services: Never    Database Administrator or Organizations: No    Attends Engineer, Structural: Not on file    Marital Status: Divorced     Review of Systems  Constitutional:  Negative for fever.       Weight loss.   HENT:  Negative for congestion and sinus pressure.   Respiratory:  Negative for cough, chest tightness and shortness of breath.   Cardiovascular:  Negative for chest pain, palpitations and leg swelling.  Gastrointestinal:  Negative for abdominal pain, nausea and vomiting.  Genitourinary:  Negative for difficulty urinating and dysuria.  Musculoskeletal:  Negative for joint swelling and myalgias.  Skin:  Negative for color change and rash.  Neurological:  Negative for dizziness and headaches.  Psychiatric/Behavioral:  Negative for agitation and dysphoric mood.        Objective:     BP 106/70   Pulse 98   Temp 97.8 F (36.6 C) (Oral)   Ht 5' 8 (1.727 m)   Wt 117 lb (53.1 kg)   SpO2 98%   BMI 17.79 kg/m  Wt Readings from Last 3 Encounters:  09/23/24 117 lb (53.1 kg)  08/22/24 125 lb 4 oz (56.8 kg)  08/08/24 131 lb (59.4 kg)    Physical Exam Vitals reviewed.  Constitutional:      General: She is not in acute distress.    Appearance: Normal appearance.  HENT:     Head: Normocephalic and atraumatic.     Right Ear: External ear normal.     Left Ear: External ear normal.     Mouth/Throat:     Pharynx: No oropharyngeal exudate or posterior oropharyngeal erythema.  Eyes:     General: No scleral icterus.       Right eye: No discharge.        Left eye: No discharge.     Conjunctiva/sclera: Conjunctivae normal.  Neck:     Thyroid : No thyromegaly.  Cardiovascular:     Rate and Rhythm: Normal rate.     Comments: Rate controlled.  Pulmonary:     Effort: No respiratory distress.     Breath sounds: Normal breath sounds. No wheezing.  Abdominal:      General: Bowel sounds are normal.     Palpations: Abdomen is soft.     Tenderness: There is no abdominal tenderness.  Musculoskeletal:        General: No swelling or tenderness.     Cervical back: Neck supple. No tenderness.  Lymphadenopathy:     Cervical: No cervical adenopathy.  Skin:  Findings: No erythema or rash.  Neurological:     Mental Status: She is alert.  Psychiatric:        Mood and Affect: Mood normal.        Behavior: Behavior normal.         Outpatient Encounter Medications as of 09/23/2024  Medication Sig   amiodarone  (PACERONE ) 100 MG tablet Take 1 tablet (100 mg total) by mouth daily.   diltiazem  (CARDIZEM  CD) 120 MG 24 hr capsule    donepezil  (ARICEPT ) 5 MG tablet Take 5 mg by mouth at bedtime.   feeding supplement (ENSURE PLUS HIGH PROTEIN) LIQD Take 237 mLs by mouth 2 (two) times daily between meals.   potassium chloride  (KLOR-CON ) 10 MEQ tablet Take 1 tablet (10 mEq total) by mouth daily.   rivaroxaban  (XARELTO ) 20 MG TABS tablet TAKE 1 TABLET BY MOUTH ONCE DAILY WITH SUPPER   [DISCONTINUED] omeprazole  (PRILOSEC) 20 MG capsule    omeprazole  (PRILOSEC) 20 MG capsule Take 1 capsule (20 mg total) by mouth daily.   rosuvastatin  (CRESTOR ) 10 MG tablet Take 1 tablet (10 mg total) by mouth daily.   [DISCONTINUED] ALPRAZolam  (XANAX ) 0.25 MG tablet TAKE 1 TABLET BY MOUTH ONCE DAILY AS NEEDED FOR ANXIETY (Patient not taking: Reported on 09/23/2024)   [DISCONTINUED] Calcium  Carb-Cholecalciferol  (CALCIUM  500/VITAMIN D PO) Take 2,000 Units by mouth daily. (Patient not taking: Reported on 09/23/2024)   [DISCONTINUED] rosuvastatin  (CRESTOR ) 10 MG tablet Take 1 tablet by mouth once daily   No facility-administered encounter medications on file as of 09/23/2024.     Lab Results  Component Value Date   WBC 6.9 09/23/2024   HGB 16.0 (H) 09/23/2024   HCT 47.8 (H) 09/23/2024   PLT 174.0 09/23/2024   GLUCOSE 95 09/23/2024   CHOL 182 07/18/2024   TRIG 128.0 07/18/2024    HDL 56.10 07/18/2024   LDLCALC 100 (H) 07/18/2024   ALT 24 09/23/2024   AST 35 09/23/2024   NA 140 09/23/2024   K 3.6 09/23/2024   CL 95 (L) 09/23/2024   CREATININE 0.91 09/23/2024   BUN 11 09/23/2024   CO2 33 (H) 09/23/2024   TSH 0.65 09/23/2024   INR 2.3 (H) 12/13/2020   HGBA1C 6.1 07/18/2024    ECHOCARDIOGRAM COMPLETE Result Date: 07/21/2024    ECHOCARDIOGRAM REPORT   Patient Name:   FUMIYE LUBBEN Date of Exam: 07/21/2024 Medical Rec #:  [id number]           Height:       68.0 in Accession #:    [id number]          Weight:       134.5 lb Date of Birth:  Jun 25, 1951           BSA:          1.727 m Patient Age:    72 years            BP:           106/72 mmHg Patient Gender: F                   HR:           91 bpm. Exam Location:  [facility] Procedure: 2D Echo, Color Doppler and Cardiac Doppler (Both Spectral and Color            Flow Doppler were utilized during procedure). Indications:     Atrial Fibrillation  History:         Patient has  prior history of Echocardiogram examinations, most                  recent 05/03/2019. Arrythmias:Atrial Fibrillation,                  Signs/Symptoms:Palpitations; Risk Factors:Hypertension,                  Dyslipidemia and GERD.  Sonographer:     Logan Shove RDCS Referring Phys:  [id number] DRUE ONEIDA POTTER Diagnosing Phys: Redell Cave MD IMPRESSIONS  1. Left ventricular ejection fraction, by estimation, is 60 to 65%. Left ventricular ejection fraction by 2D MOD biplane is 64.2 %. Left ventricular ejection fraction by PLAX is 69 %. The left ventricle has normal function. Left ventricular endocardial border not optimally defined to evaluate regional wall motion. Left ventricular diastolic parameters are consistent with Grade I diastolic dysfunction (impaired relaxation).  2. Right ventricular systolic function is normal. The right ventricular size is normal.  3. The mitral valve is normal in structure. Mild mitral valve regurgitation.  4. The aortic valve is  tricuspid. Aortic valve regurgitation is not visualized.  5. The inferior vena cava is normal in size with greater than 50% respiratory variability, suggesting right atrial pressure of 3 mmHg. FINDINGS  Left Ventricle: Left ventricular ejection fraction, by estimation, is 60 to 65%. Left ventricular ejection fraction by PLAX is 69 %. Left ventricular ejection fraction by 2D MOD biplane is 64.2 %. The left ventricle has normal function. Left ventricular  endocardial border not optimally defined to evaluate regional wall motion. The left ventricular internal cavity size was normal in size. There is no left ventricular hypertrophy. Left ventricular diastolic parameters are consistent with Grade I diastolic dysfunction (impaired relaxation). Right Ventricle: The right ventricular size is normal. No increase in right ventricular wall thickness. Right ventricular systolic function is normal. Left Atrium: Left atrial size was normal in size. Right Atrium: Right atrial size was normal in size. Pericardium: There is no evidence of pericardial effusion. Mitral Valve: The mitral valve is normal in structure. Mild mitral valve regurgitation. Tricuspid Valve: The tricuspid valve is normal in structure. Tricuspid valve regurgitation is mild. Aortic Valve: The aortic valve is tricuspid. Aortic valve regurgitation is not visualized. Aortic valve peak gradient measures 3.1 mmHg. Pulmonic Valve: The pulmonic valve was not well visualized. Pulmonic valve regurgitation is trivial. Aorta: The aortic root is normal in size and structure. Venous: The inferior vena cava is normal in size with greater than 50% respiratory variability, suggesting right atrial pressure of 3 mmHg. IAS/Shunts: No atrial level shunt detected by color flow Doppler.  LEFT VENTRICLE PLAX 2D                        Biplane EF (MOD) LV EF:         Left            LV Biplane EF:   Left                ventricular                      ventricular                ejection                          ejection  fraction by                      fraction by                PLAX is 69                       2D MOD                %.                               biplane is LVIDd:         4.14 cm                          64.2 %. LVIDs:         2.56 cm LV PW:         1.14 cm         Diastology LV IVS:        0.88 cm         LV e' medial:    5.51 cm/s LVOT diam:     2.50 cm         LV E/e' medial:  8.7 LVOT Area:     4.91 cm        LV e' lateral:   6.77 cm/s                                LV E/e' lateral: 7.1  LV Volumes (MOD) LV vol d, MOD    65.5 ml A2C: LV vol d, MOD    64.4 ml A4C: LV vol s, MOD    24.5 ml A2C: LV vol s, MOD    22.7 ml A4C: LV SV MOD A2C:   41.0 ml LV SV MOD A4C:   64.4 ml LV SV MOD BP:    41.6 ml RIGHT VENTRICLE            IVC RV Basal diam:  2.95 cm    IVC diam: 1.51 cm RV S prime:     9.67 cm/s LEFT ATRIUM             Index        RIGHT ATRIUM           Index LA diam:        2.70 cm 1.56 cm/m   RA Area:     15.20 cm LA Vol (A2C):   40.7 ml 23.57 ml/m  RA Volume:   37.40 ml  21.66 ml/m LA Vol (A4C):   27.5 ml 15.93 ml/m LA Biplane Vol: 35.3 ml 20.45 ml/m  AORTIC VALVE AV Area (Vmax): 4.46 cm AV Vmax:        88.60 cm/s AV Peak Grad:   3.1 mmHg LVOT Vmax:      80.50 cm/s  AORTA Ao Root diam: 2.70 cm Ao Asc diam:  2.70 cm MITRAL VALVE MV Area (PHT): 5.09 cm    SHUNTS MV Decel Time: 149 msec    Systemic Diam: 2.50 cm MV E velocity: 48.00 cm/s MV A velocity: 61.10 cm/s MV E/A ratio:  0.79 Redell Cave MD Electronically signed by Redell Cave MD Signature Date/Time: 07/21/2024/4:31:38 PM    Final    DG Chest Port 1 View Result  Date: 07/21/2024 EXAM: 1 VIEW(S) XRAY OF THE CHEST 07/21/2024 02:25:22 AM COMPARISON: October 05 2021 . CLINICAL HISTORY: Atrial fibrillation with RVR (HCC) [id number]. PER ER NOTE; Pt in via POV, complaints of intermittent heart palpitations x 1 day. Per daughter, her HR has been high and BP low all day, states she has a log of  it. Reports hx of Afibb; denies any chest pain. A/Ox4, NAD noted at this time. EKG shows Afibb RVR w/ hypotension. CHANGE TO PORTABLE PER DR FLOY FINDINGS: LUNGS AND PLEURA: No focal pulmonary opacity. No pulmonary edema. No pleural effusion. No pneumothorax. HEART AND MEDIASTINUM: No acute abnormality of the cardiac and mediastinal silhouettes. BONES AND SOFT TISSUES: No acute osseous abnormality. IMPRESSION: 1. No acute abnormalities. Electronically signed by: Norman Gatlin MD 07/21/2024 02:35 AM EDT RP Workstation: [id number]       Assessment & Plan:  Need for influenza vaccination -     Flu vaccine HIGH DOSE PF(Fluzone Trivalent)  Primary hypertension Assessment & Plan: Currently off metoprolol  and diltiazem  as outlined. Follow pressures and heart rate. No change in medication today.   Orders: -     Basic metabolic panel with GFR  Hypercholesterolemia Assessment & Plan: Continue crestor . Follow lipid panel and liver function tests.   Orders: -     Hepatic function panel  Hyperglycemia Assessment & Plan: Follow met b and A1c.    Weight loss Assessment & Plan: Significant amount of weight loss. Discussed weight loss. Discussed increased po intake - nutritional shakes to supplement meals. No acute pain or abnormality. Discussed further w/up. Wants to follow.    Orders: -     TSH -     CBC with Differential/Platelet  Thyroid  nodule Assessment & Plan: Saw endocrinology 05/15/23 - thyroid  mass. Ultrasound thyroid  06/08/23 - 3.6cm solid nodule. Also left sided 1.0 cm nodule. Recommended FNA of the 3.6 cm mass. Ultrasound guided biopsy 07/05/23. Discussed with her today. Discussed f/u. Wants to hold on follow up.    Paroxysmal atrial fibrillation (HCC) Assessment & Plan: Continue xarelto .  Is currently off cardizem  CD 120mg  and toprol . Has diltiazem  30mg  prn to take if needed. Continues on amiodarone  - now on 100mg  q day. Stable. Follow.    Osteoporosis, unspecified  osteoporosis type, unspecified pathological fracture presence Assessment & Plan: Continue calcium , vitamin d and weight bearing exercise. Will need f/u bone density. Appears to be off fosamax .    Memory change Assessment & Plan: Saw neurology 02/2024. W/up in progress. Recommended MRI. Plans to schedule MRI.    Other orders -     Rosuvastatin  Calcium ; Take 1 tablet (10 mg total) by mouth daily.  Dispense: 90 tablet; Refill: 0 -     Omeprazole ; Take 1 capsule (20 mg total) by mouth daily.  Dispense: 30 capsule; Refill: 1     Allena Hamilton, MD

## 2024-09-24 ENCOUNTER — Ambulatory Visit: Payer: Self-pay | Admitting: Internal Medicine

## 2024-09-24 ENCOUNTER — Ambulatory Visit: Admitting: Cardiology

## 2024-09-24 DIAGNOSIS — R634 Abnormal weight loss: Secondary | ICD-10-CM

## 2024-09-24 DIAGNOSIS — I10 Essential (primary) hypertension: Secondary | ICD-10-CM

## 2024-09-29 ENCOUNTER — Encounter: Payer: Self-pay | Admitting: Internal Medicine

## 2024-09-29 NOTE — Assessment & Plan Note (Signed)
 Significant amount of weight loss. Discussed weight loss. Discussed increased po intake - nutritional shakes to supplement meals. No acute pain or abnormality. Discussed further w/up. Wants to follow.

## 2024-09-29 NOTE — Assessment & Plan Note (Signed)
 Saw endocrinology 05/15/23 - thyroid  mass. Ultrasound thyroid  06/08/23 - 3.6cm solid nodule. Also left sided 1.0 cm nodule. Recommended FNA of the 3.6 cm mass. Ultrasound guided biopsy 07/05/23. Discussed with her today. Discussed f/u. Wants to hold on follow up.

## 2024-09-29 NOTE — Assessment & Plan Note (Signed)
 Follow met b and A1c.

## 2024-09-29 NOTE — Assessment & Plan Note (Signed)
 Continue crestor. Follow lipid panel and liver function tests.

## 2024-09-29 NOTE — Assessment & Plan Note (Signed)
 Saw neurology 02/2024. W/up in progress. Recommended MRI. Plans to schedule MRI.

## 2024-09-29 NOTE — Assessment & Plan Note (Addendum)
 Continue xarelto .  Is currently off cardizem  CD 120mg  and toprol . Has diltiazem  30mg  prn to take if needed. Continues on amiodarone  - now on 100mg  q day. Stable. Follow.

## 2024-09-29 NOTE — Assessment & Plan Note (Signed)
 Continue calcium , vitamin d and weight bearing exercise. Will need f/u bone density. Appears to be off fosamax .

## 2024-09-29 NOTE — Assessment & Plan Note (Signed)
 Currently off metoprolol  and diltiazem  as outlined. Follow pressures and heart rate. No change in medication today.

## 2024-09-30 ENCOUNTER — Ambulatory Visit: Admission: RE | Admit: 2024-09-30

## 2024-10-03 ENCOUNTER — Ambulatory Visit: Admission: RE | Admit: 2024-10-03 | Discharge: 2024-10-03 | Attending: Neurology | Admitting: Neurology

## 2024-10-03 DIAGNOSIS — R413 Other amnesia: Secondary | ICD-10-CM | POA: Insufficient documentation

## 2024-10-04 ENCOUNTER — Encounter

## 2024-10-07 ENCOUNTER — Other Ambulatory Visit

## 2024-10-08 ENCOUNTER — Other Ambulatory Visit

## 2024-10-10 ENCOUNTER — Other Ambulatory Visit (INDEPENDENT_AMBULATORY_CARE_PROVIDER_SITE_OTHER)

## 2024-10-10 DIAGNOSIS — R634 Abnormal weight loss: Secondary | ICD-10-CM

## 2024-10-10 DIAGNOSIS — I10 Essential (primary) hypertension: Secondary | ICD-10-CM

## 2024-10-11 LAB — BASIC METABOLIC PANEL WITH GFR
BUN: 10 mg/dL (ref 6–23)
CO2: 32 meq/L (ref 19–32)
Calcium: 9.1 mg/dL (ref 8.4–10.5)
Chloride: 101 meq/L (ref 96–112)
Creatinine, Ser: 0.74 mg/dL (ref 0.40–1.20)
GFR: 81 mL/min
Glucose, Bld: 103 mg/dL — ABNORMAL HIGH (ref 70–99)
Potassium: 3.5 meq/L (ref 3.5–5.1)
Sodium: 143 meq/L (ref 135–145)

## 2024-10-11 LAB — CBC WITH DIFFERENTIAL/PLATELET
Basophils Absolute: 0.2 K/uL — ABNORMAL HIGH (ref 0.0–0.1)
Basophils Relative: 3.3 % — ABNORMAL HIGH (ref 0.0–3.0)
Eosinophils Absolute: 0.1 K/uL (ref 0.0–0.7)
Eosinophils Relative: 2.4 % (ref 0.0–5.0)
HCT: 41.8 % (ref 36.0–46.0)
Hemoglobin: 14.2 g/dL (ref 12.0–15.0)
Lymphocytes Relative: 20.9 % (ref 12.0–46.0)
Lymphs Abs: 1.1 K/uL (ref 0.7–4.0)
MCHC: 33.9 g/dL (ref 30.0–36.0)
MCV: 95 fl (ref 78.0–100.0)
Monocytes Absolute: 0.5 K/uL (ref 0.1–1.0)
Monocytes Relative: 10.2 % (ref 3.0–12.0)
Neutro Abs: 3.2 K/uL (ref 1.4–7.7)
Neutrophils Relative %: 63.2 % (ref 43.0–77.0)
Platelets: 184 K/uL (ref 150.0–400.0)
RBC: 4.4 Mil/uL (ref 3.87–5.11)
RDW: 14.8 % (ref 11.5–15.5)
WBC: 5.1 K/uL (ref 4.0–10.5)

## 2024-10-13 ENCOUNTER — Ambulatory Visit: Payer: Self-pay | Admitting: Internal Medicine

## 2024-11-18 ENCOUNTER — Other Ambulatory Visit: Payer: Self-pay | Admitting: Cardiovascular Disease

## 2024-11-18 DIAGNOSIS — I480 Paroxysmal atrial fibrillation: Secondary | ICD-10-CM

## 2024-11-19 NOTE — Telephone Encounter (Signed)
 Xarelto  20mg  refill request received. Pt is 74 years old, weight-53.1kg, Crea-0.74 on 10/10/24, last seen by Lari Needle on 08/22/24, Diagnosis-Afib, CrCl- 57.6 mL/min; Dose is appropriate based on dosing criteria. Will send in refill to requested pharmacy.

## 2024-11-26 ENCOUNTER — Encounter: Payer: Self-pay | Admitting: Internal Medicine

## 2024-11-26 ENCOUNTER — Telehealth: Admitting: Internal Medicine

## 2024-11-29 ENCOUNTER — Telehealth: Payer: Self-pay | Admitting: Internal Medicine

## 2024-11-29 NOTE — Telephone Encounter (Signed)
"  Brandi notified.  "

## 2024-11-29 NOTE — Telephone Encounter (Signed)
 Please call Ms Tellez daughter and let her know that I have reached out to neurology. I will let her know when I hear back.  I will f/u with them as well.  Thanks.

## 2024-12-03 ENCOUNTER — Ambulatory Visit: Admitting: Cardiology

## 2024-12-10 ENCOUNTER — Ambulatory Visit: Admitting: Cardiovascular Disease

## 2024-12-23 ENCOUNTER — Encounter

## 2025-03-24 ENCOUNTER — Ambulatory Visit
